# Patient Record
Sex: Male | Born: 1955
Health system: Southern US, Community
[De-identification: ages and names within clinical notes are randomized; demographics above are authoritative.]

## PROBLEM LIST (undated history)

## (undated) DIAGNOSIS — I7121 Aneurysm of the ascending aorta, without rupture: Secondary | ICD-10-CM

## (undated) DIAGNOSIS — E119 Type 2 diabetes mellitus without complications: Secondary | ICD-10-CM

## (undated) DIAGNOSIS — H409 Unspecified glaucoma: Secondary | ICD-10-CM

## (undated) DIAGNOSIS — G4730 Sleep apnea, unspecified: Secondary | ICD-10-CM

## (undated) DIAGNOSIS — R011 Cardiac murmur, unspecified: Secondary | ICD-10-CM

## (undated) DIAGNOSIS — Q2381 Bicuspid aortic valve: Secondary | ICD-10-CM

## (undated) DIAGNOSIS — R0902 Hypoxemia: Secondary | ICD-10-CM

## (undated) DIAGNOSIS — T7840XA Allergy, unspecified, initial encounter: Secondary | ICD-10-CM

## (undated) DIAGNOSIS — H269 Unspecified cataract: Secondary | ICD-10-CM

## (undated) DIAGNOSIS — Q231 Congenital insufficiency of aortic valve: Secondary | ICD-10-CM

## (undated) DIAGNOSIS — I2510 Atherosclerotic heart disease of native coronary artery without angina pectoris: Secondary | ICD-10-CM

## (undated) DIAGNOSIS — I712 Thoracic aortic aneurysm, without rupture: Secondary | ICD-10-CM

## (undated) DIAGNOSIS — I509 Heart failure, unspecified: Secondary | ICD-10-CM

## (undated) DIAGNOSIS — I499 Cardiac arrhythmia, unspecified: Secondary | ICD-10-CM

## (undated) HISTORY — DX: Atherosclerotic heart disease of native coronary artery without angina pectoris: I25.10

## (undated) HISTORY — DX: Unspecified glaucoma: H40.9

## (undated) HISTORY — PX: EYE SURGERY: SHX253

## (undated) HISTORY — DX: Bicuspid aortic valve: Q23.81

## (undated) HISTORY — DX: Congenital insufficiency of aortic valve: Q23.1

## (undated) HISTORY — PX: SPINE SURGERY: SHX786

## (undated) HISTORY — PX: CARDIAC VALVE REPLACEMENT: SHX585

## (undated) HISTORY — DX: Aneurysm of the ascending aorta, without rupture: I71.21

## (undated) HISTORY — PX: TONSILLECTOMY: SUR1361

## (undated) HISTORY — DX: Thoracic aortic aneurysm, without rupture: I71.2

## (undated) HISTORY — DX: Cardiac murmur, unspecified: R01.1

## (undated) HISTORY — DX: Unspecified cataract: H26.9

## (undated) HISTORY — DX: Allergy, unspecified, initial encounter: T78.40XA

## (undated) HISTORY — DX: Sleep apnea, unspecified: G47.30

## (undated) HISTORY — DX: Type 2 diabetes mellitus without complications: E11.9

## (undated) HISTORY — DX: Hypoxemia: R09.02

---

## 2004-01-03 HISTORY — PX: COLONOSCOPY: SHX174

## 2013-01-22 ENCOUNTER — Encounter: Payer: Self-pay | Admitting: Internal Medicine

## 2013-01-22 ENCOUNTER — Ambulatory Visit (INDEPENDENT_AMBULATORY_CARE_PROVIDER_SITE_OTHER): Payer: BC Managed Care – PPO | Admitting: Internal Medicine

## 2013-01-22 VITALS — BP 120/88 | HR 76 | Temp 97.9°F | Ht 77.5 in | Wt 218.0 lb

## 2013-01-22 DIAGNOSIS — R5383 Other fatigue: Principal | ICD-10-CM

## 2013-01-22 DIAGNOSIS — B370 Candidal stomatitis: Secondary | ICD-10-CM

## 2013-01-22 DIAGNOSIS — R059 Cough, unspecified: Secondary | ICD-10-CM | POA: Insufficient documentation

## 2013-01-22 DIAGNOSIS — Z125 Encounter for screening for malignant neoplasm of prostate: Secondary | ICD-10-CM

## 2013-01-22 DIAGNOSIS — R5381 Other malaise: Secondary | ICD-10-CM | POA: Insufficient documentation

## 2013-01-22 DIAGNOSIS — Z0000 Encounter for general adult medical examination without abnormal findings: Secondary | ICD-10-CM

## 2013-01-22 DIAGNOSIS — G609 Hereditary and idiopathic neuropathy, unspecified: Secondary | ICD-10-CM

## 2013-01-22 DIAGNOSIS — G629 Polyneuropathy, unspecified: Secondary | ICD-10-CM | POA: Insufficient documentation

## 2013-01-22 DIAGNOSIS — R05 Cough: Secondary | ICD-10-CM

## 2013-01-22 DIAGNOSIS — Z833 Family history of diabetes mellitus: Secondary | ICD-10-CM

## 2013-01-22 MED ORDER — NYSTATIN 100000 UNIT/ML MT SUSP: 5.0000 mL | Freq: Four times a day (QID) | OROMUCOSAL | Status: DC

## 2013-01-22 NOTE — Assessment & Plan Note (Signed)
Patient has strong family history of type 2 diabetes. Obtain screening A1c.

## 2013-01-22 NOTE — Assessment & Plan Note (Addendum)
Patient recently treated for upper respiratory infection in urgent care facility with course of azithromycin. I suspect oral thrush secondary to advised use. Treat with nystatin suspension.

## 2013-01-22 NOTE — Assessment & Plan Note (Addendum)
Patient complains of cough for 2-3 weeks. He was treated at urgent care with azithromycin.  Patient reports chest x-ray performed at urgent care was negative for acute cardiopulmonary disease.  Patient exam is unremarkable. He has persistent nonproductive cough. I suspect the initial etiology of upper respiratory illness was viral in nature. I recommended symptomatic treatment including use of Mucinex, gargling with warm salt water and using intranasal saline.  Patient advised to call office if symptoms persist or worsen.  Obtain CBCD.

## 2013-01-22 NOTE — Patient Instructions (Signed)
Please complete the following lab tests before your next follow up appointment: CPX labs within one week including PSA (V76.44) Gargle with warm salt water.  Use intranasal saline spray as directed

## 2013-01-22 NOTE — Assessment & Plan Note (Signed)
58 year old with clinical signs of mild peripheral neuropathy. He has hammertoes on exam. He discussed the possibility that he may have Charcot-Marie-Tooth disease.

## 2013-01-22 NOTE — Assessment & Plan Note (Signed)
Obtain screening blood tests.

## 2013-01-22 NOTE — Assessment & Plan Note (Signed)
We discussed pros and cons of Prostate cancer screening. Patient elects to obtain baseline PSA and digital rectal exam at next office visit.

## 2013-01-22 NOTE — Progress Notes (Signed)
Subjective:    Patient ID: Clifford Alvarez, male    DOB: 10/10/1955, 58 y.o.   MRN: [id number]  HPI  58 year-old white male to establish. Patient denies any chronic medical history.  It has been over 5 years since his last physical with Dr. Karren BurlyBlomgrin.  Patient reports childhood history of heart murmur. He was seen by cardiology in the past. Patient reports heart murmur involve the aortic valve. He was released by cardiology and his primary care physician could no longer appreciate murmur on previous physical exams. He denies any symptoms of aortic stenosis. He denies any history of syncope, chest pain or dizziness.  Patient was seen by urgent care facility approximately one week ago. He was seen for upper respiratory symptoms including cough, congestion and fever. He also felt unusual malaise. He was treated with course of azithromycin for 5 days. He had persistent cough and malaise and was reevaluated that same urgent care. Chest x-ray is reported clear by patient. His fevers have resolved. He has persistent nonproductive cough. No further antibiotics were prescribed. Patient was advised to followup with a primary care physician.  He is accompanied by supportive wife - Clifford Alvarez.  Preventative health care-patient reports completing colonoscopy age 58. He does not recall name of gastroenterologist who performed procedure. Patient reports colonoscopy was normal. Followup colonoscopy recommended in 10 years (at age 58)  Review of Systems  Constitutional: Negative for activity change, appetite change and unexpected weight change.  Eyes: Negative for visual disturbance.  Respiratory: positive for cough, negative for shortness of breath.   Negative for sinus pressure or sinus headache.  No dental pain He denies post nasal gtt Cardiovascular: Negative for chest pain.  Genitourinary: Negative for difficulty urinating.  Neurological: Negative for headaches.  Gastrointestinal: Negative for abdominal  pain, heartburn melena or hematochezia Psych: Negative for depression or anxiety Endo:  No polyuria or polydypsia       Past Medical History  Diagnosis Date  . Heart murmur     History of childhood heart murmur (Aortic)    History   Social History  . Marital Status: Married    Spouse Name: Clifford Alvarez    Number of Children: 2  . Years of Education: N/A   Occupational History  . 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 Smoking status: Never Smoker   . Smokeless tobacco: Not on file  . Alcohol Use: No  . Drug Use: No  . Sexual Activity: Not on file   Other Topics Concern  . Not on file   Social History Narrative   Married 38 years   Wife - Barbarann EhlersDebbie   Son [id number]   Daughter 7933    History reviewed. No pertinent past surgical history.  Family History  Problem Relation Age of Onset  . Diabetes Mother   . Diabetes Father   . Heart disease Father   . Diabetes Brother   . Diabetes Sister   . Hypertension Mother     No Known Allergies  No current outpatient prescriptions on file prior to visit.   No current facility-administered medications on file prior to visit.    BP 120/88  Pulse 76  Temp(Src) 97.9 F (36.6 C) (Oral)  Ht 6' 5.5" (1.969 m)  Wt 218 lb (98.884 kg)  BMI 25.51 kg/m2    Objective:   Physical Exam  Constitutional: He is oriented to person, place, and time. He appears well-developed and well-nourished.  No distress.  HENT:  Head: Normocephalic and atraumatic.  Right Ear: External ear normal.  Left Ear: External ear normal.  White film on tongue  Eyes: Conjunctivae and EOM are normal. Pupils are equal, round, and reactive to light.  Neck: Normal range of motion. Neck supple.  No carotid bruit  Cardiovascular: Normal rate, regular rhythm and normal heart sounds.   No murmur heard. No murmurs with left hand grip or Valsalva maneuver  Pulmonary/Chest: Effort normal and breath sounds normal. He has no wheezes.    Abdominal: Soft. Bowel sounds are normal. He exhibits no mass. There is no tenderness.  Musculoskeletal: He exhibits no edema.  Lymphadenopathy:    He has no cervical adenopathy.  Neurological: He is alert and oriented to person, place, and time. No cranial nerve deficit.  Bilateral hammar toes.  Slight decrease in sensation of temperature and vibration in bilateral lower extremities  Skin: Skin is warm and dry.  Psychiatric: He has a normal mood and affect. His behavior is normal.          Assessment & Plan:

## 2013-01-28 ENCOUNTER — Other Ambulatory Visit (INDEPENDENT_AMBULATORY_CARE_PROVIDER_SITE_OTHER): Payer: BC Managed Care – PPO

## 2013-01-28 DIAGNOSIS — Z125 Encounter for screening for malignant neoplasm of prostate: Secondary | ICD-10-CM

## 2013-01-28 DIAGNOSIS — Z833 Family history of diabetes mellitus: Secondary | ICD-10-CM

## 2013-01-28 DIAGNOSIS — B370 Candidal stomatitis: Secondary | ICD-10-CM

## 2013-01-28 DIAGNOSIS — Z0000 Encounter for general adult medical examination without abnormal findings: Secondary | ICD-10-CM

## 2013-01-28 LAB — CBC WITH DIFFERENTIAL/PLATELET
BASOS PCT: 0.6 % (ref 0.0–3.0)
Basophils Absolute: 0 10*3/uL (ref 0.0–0.1)
Eosinophils Absolute: 0.3 10*3/uL (ref 0.0–0.7)
Eosinophils Relative: 4.5 % (ref 0.0–5.0)
HCT: 44 % (ref 39.0–52.0)
Hemoglobin: 15.1 g/dL (ref 13.0–17.0)
LYMPHS PCT: 32.7 % (ref 12.0–46.0)
Lymphs Abs: 2 10*3/uL (ref 0.7–4.0)
MCHC: 34.2 g/dL (ref 30.0–36.0)
MCV: 88.3 fl (ref 78.0–100.0)
Monocytes Absolute: 0.7 10*3/uL (ref 0.1–1.0)
Monocytes Relative: 11.8 % (ref 3.0–12.0)
NEUTROS PCT: 50.4 % (ref 43.0–77.0)
Neutro Abs: 3 10*3/uL (ref 1.4–7.7)
PLATELETS: 240 10*3/uL (ref 150.0–400.0)
RBC: 4.98 Mil/uL (ref 4.22–5.81)
RDW: 12.4 % (ref 11.5–14.6)
WBC: 6 10*3/uL (ref 4.5–10.5)

## 2013-01-28 LAB — BASIC METABOLIC PANEL
BUN: 12 mg/dL (ref 6–23)
CO2: 28 mEq/L (ref 19–32)
Calcium: 9.2 mg/dL (ref 8.4–10.5)
Chloride: 100 mEq/L (ref 96–112)
Creatinine, Ser: 0.9 mg/dL (ref 0.4–1.5)
GFR: 89.56 mL/min (ref >60.00)
Glucose, Bld: 345 mg/dL — ABNORMAL HIGH (ref 70–99)
Potassium: 4.9 mEq/L (ref 3.5–5.1)
Sodium: 135 mEq/L (ref 135–145)

## 2013-01-28 LAB — LIPID PANEL
Cholesterol: 248 mg/dL — ABNORMAL HIGH (ref 0–200)
HDL: 36 mg/dL — ABNORMAL LOW (ref >39.00)
Total CHOL/HDL Ratio: 7
Triglycerides: 352 mg/dL — ABNORMAL HIGH (ref 0.0–149.0)
VLDL: 70.4 mg/dL — ABNORMAL HIGH (ref 0.0–40.0)

## 2013-01-28 LAB — HEPATIC FUNCTION PANEL
ALBUMIN: 3.8 g/dL (ref 3.5–5.2)
ALK PHOS: 100 U/L (ref 39–117)
ALT: 20 U/L (ref 0–53)
AST: 19 U/L (ref 0–37)
BILIRUBIN DIRECT: 0.1 mg/dL (ref 0.0–0.3)
Total Bilirubin: 1 mg/dL (ref 0.3–1.2)
Total Protein: 6.9 g/dL (ref 6.0–8.3)

## 2013-01-28 LAB — HEMOGLOBIN A1C: HEMOGLOBIN A1C: 12.1 % — AB (ref 4.6–6.5)

## 2013-01-28 LAB — PSA: PSA: 0.62 ng/mL (ref 0.10–4.00)

## 2013-01-28 LAB — LDL CHOLESTEROL, DIRECT: Direct LDL: 161 mg/dL

## 2013-01-28 LAB — TSH: TSH: 1.86 u[IU]/mL (ref 0.35–5.50)

## 2013-01-29 ENCOUNTER — Telehealth: Payer: Self-pay | Admitting: Internal Medicine

## 2013-01-29 ENCOUNTER — Ambulatory Visit (INDEPENDENT_AMBULATORY_CARE_PROVIDER_SITE_OTHER): Payer: BC Managed Care – PPO | Admitting: Internal Medicine

## 2013-01-29 ENCOUNTER — Encounter: Payer: Self-pay | Admitting: Internal Medicine

## 2013-01-29 VITALS — BP 146/92 | Ht 77.5 in | Wt 218.0 lb

## 2013-01-29 DIAGNOSIS — E1142 Type 2 diabetes mellitus with diabetic polyneuropathy: Secondary | ICD-10-CM | POA: Insufficient documentation

## 2013-01-29 DIAGNOSIS — IMO0001 Reserved for inherently not codable concepts without codable children: Secondary | ICD-10-CM

## 2013-01-29 DIAGNOSIS — E1165 Type 2 diabetes mellitus with hyperglycemia: Principal | ICD-10-CM

## 2013-01-29 DIAGNOSIS — E1149 Type 2 diabetes mellitus with other diabetic neurological complication: Secondary | ICD-10-CM

## 2013-01-29 DIAGNOSIS — I10 Essential (primary) hypertension: Secondary | ICD-10-CM

## 2013-01-29 DIAGNOSIS — IMO0002 Reserved for concepts with insufficient information to code with codable children: Secondary | ICD-10-CM

## 2013-01-29 DIAGNOSIS — E785 Hyperlipidemia, unspecified: Secondary | ICD-10-CM

## 2013-01-29 LAB — HIV ANTIBODY (ROUTINE TESTING W REFLEX): HIV: NONREACTIVE

## 2013-01-29 MED ORDER — METFORMIN HCL 500 MG PO TABS: 500.0000 mg | ORAL_TABLET | Freq: Two times a day (BID) | ORAL | Status: DC

## 2013-01-29 MED ORDER — INSULIN PEN NEEDLE 32G X 4 MM MISC: 1.0000 | Freq: Every day | Status: DC

## 2013-01-29 MED ORDER — GLUCOSE BLOOD VI STRP: 1.0000 | ORAL_STRIP | Freq: Three times a day (TID) | Status: DC

## 2013-01-29 MED ORDER — INSULIN GLARGINE 100 UNIT/ML SOLOSTAR PEN: 10.0000 [IU] | PEN_INJECTOR | Freq: Every day | SUBCUTANEOUS | Status: DC

## 2013-01-29 MED ORDER — LOSARTAN POTASSIUM 25 MG PO TABS: 25.0000 mg | ORAL_TABLET | Freq: Every day | ORAL | Status: DC

## 2013-01-29 MED ORDER — ATORVASTATIN CALCIUM 20 MG PO TABS: 20.0000 mg | ORAL_TABLET | Freq: Every day | ORAL | Status: DC

## 2013-01-29 NOTE — Assessment & Plan Note (Signed)
Start losartan for renal protection. Check microalbumin to creatinine ratio.

## 2013-01-29 NOTE — Progress Notes (Signed)
   Subjective:    Patient ID: Clifford Alvarez, male    DOB: 12/25/1955, 58 y.o.   MRN: [id number]  HPI  58 year old white male previously seen for viral upper ear infection for followup. Patient reports feeling much better. He still has mild residual cough. We obtain screening blood work for type 2 diabetes considering strong family history. Patient denies any symptoms of polyuria or polydipsia.  Patient's blood work showed significant hyperglycemia with fasting blood sugar 345 and hemoglobin A1c of 12.1.  His oral thrush resolved with nystatin suspension.   Review of Systems    negative for chest pain, mild fatigue  Past Medical History  Diagnosis Date  . Heart murmur     History of childhood heart murmur (Aortic)  . Glaucoma     History   Social History  . Marital Status: Married    Spouse Name: Debbie    Number of Children: 2  . Years of Education: N/A   Occupational History  . 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 Smoking status: Never Smoker   . Smokeless tobacco: Not on file  . Alcohol Use: No  . Drug Use: No  . Sexual Activity: Not on file   Other Topics Concern  . Not on file   Social History Narrative   Married 38 years   Wife - Eunice BlaseDebbie   Son [id number]   Daughter [id number]    No past surgical history on file.  Family History  Problem Relation Age of Onset  . Diabetes Mother   . Diabetes Father   . Heart disease Father   . Diabetes Brother   . Diabetes Sister   . Hypertension Mother     No Known Allergies  Current Outpatient Prescriptions on File Prior to Visit  Medication Sig Dispense Refill  . nystatin (MYCOSTATIN) 100000 UNIT/ML suspension Take 5 mLs (500,000 Units total) by mouth 4 (four) times daily.  60 mL  0   No current facility-administered medications on file prior to visit.    BP 146/92  Ht 6' 5.5" (1.969 m)  Wt 218 lb (98.884 kg)  BMI 25.51 kg/m2      Objective:   Physical Exam    Constitutional: He is oriented to person, place, and time. He appears well-developed and well-nourished. No distress.  HENT:  Head: Normocephalic and atraumatic.  Neck: Neck supple.  No carotid bruit  Cardiovascular: Normal rate, regular rhythm and normal heart sounds.   No murmur heard. Pulmonary/Chest: Effort normal and breath sounds normal. He has no wheezes.  Neurological: He is alert and oriented to person, place, and time. No cranial nerve deficit.  Decreased sensation to temperature and vibration in lower extremities  Skin:  Dry, scaly feet  Psychiatric: He has a normal mood and affect. His behavior is normal.          Assessment & Plan:

## 2013-01-29 NOTE — Telephone Encounter (Signed)
I received a PA for One Touch Verio Test Strip and per pt's plan Bayer Contour Test Strips and Bayer Next Test Strips are covered without a PA needed.  Would you like for me to attempt PA approval on the One Touch or would you like to change the RX to one of the Bayer Test Strips??  Please advise

## 2013-01-29 NOTE — Assessment & Plan Note (Addendum)
58 year old white male with new onset type 2 diabetes. His A1c is 12.1. He likely has had hyperglycemia for many years. I suspect his peripheral neuropathy secondary to uncontrolled diabetes. Start insulin therapy Lantus 10 units at bedtime. Patient understands to titrate his Lantus dose every other day by 2 units until he is seen by endocrinologist. Patient advised to limit his Lantus dose to 20 units at bedtime. Also start Metformin 500 mg twice daily.  We discussed at length need to follow carb modified diet. Patient currently drinking quite a bit of fruit juices.  Glucometer provided.  Patient advised to follow up with his Ophthalmologist.  Patient given copy of his blood work.

## 2013-01-29 NOTE — Patient Instructions (Signed)
Please complete the following lab tests before your next follow up appointment: BMET - 401.9, 250.02 FLP, LFTs - 272.4

## 2013-01-29 NOTE — Assessment & Plan Note (Signed)
We discussed at length importance of risk factor reduction for macrovascular complications of type 2 diabetes. Start atorvastatin 20 mg once daily.

## 2013-01-30 MED ORDER — INSULIN DETEMIR 100 UNIT/ML FLEXPEN: 10.0000 [IU] | PEN_INJECTOR | Freq: Every day | SUBCUTANEOUS | Status: DC

## 2013-01-30 MED ORDER — GLUCOSE BLOOD VI STRP: 1.0000 | ORAL_STRIP | Freq: Three times a day (TID) | Status: DC

## 2013-01-30 NOTE — Telephone Encounter (Signed)
Levemir is $398

## 2013-01-30 NOTE — Telephone Encounter (Signed)
Per Dr Artist PaisYoo give pt samples of levemir, samples in refrigerator on IM side with pts name on it.  Left message on pts cell phone

## 2013-01-30 NOTE — Telephone Encounter (Signed)
rx changed to contour, pt is going to come by and pick up the contour glucometer.  He also informed me that his insurance doesn't cover lantus.  The recommend novolog/novolin Please advise

## 2013-01-30 NOTE — Telephone Encounter (Signed)
See if they cover Levemir.  If yes, same dosage and instructions.

## 2013-01-31 ENCOUNTER — Telehealth: Payer: Self-pay

## 2013-01-31 ENCOUNTER — Telehealth: Payer: Self-pay | Admitting: Internal Medicine

## 2013-01-31 NOTE — Telephone Encounter (Signed)
Relevant patient education assigned to patient using Emmi. ° °

## 2013-01-31 NOTE — Telephone Encounter (Signed)
2 boxes of levemir given

## 2013-01-31 NOTE — Telephone Encounter (Signed)
Give him enough samples Levemir until he sees endocrinologist.

## 2013-02-06 ENCOUNTER — Ambulatory Visit (INDEPENDENT_AMBULATORY_CARE_PROVIDER_SITE_OTHER): Payer: BC Managed Care – PPO | Admitting: Internal Medicine

## 2013-02-06 ENCOUNTER — Encounter: Payer: Self-pay | Admitting: Internal Medicine

## 2013-02-06 VITALS — BP 112/70 | HR 85 | Temp 97.9°F | Resp 12 | Ht 76.5 in | Wt 221.0 lb

## 2013-02-06 DIAGNOSIS — E1165 Type 2 diabetes mellitus with hyperglycemia: Principal | ICD-10-CM

## 2013-02-06 DIAGNOSIS — E1149 Type 2 diabetes mellitus with other diabetic neurological complication: Secondary | ICD-10-CM

## 2013-02-06 DIAGNOSIS — IMO0002 Reserved for concepts with insufficient information to code with codable children: Secondary | ICD-10-CM

## 2013-02-06 MED ORDER — INSULIN DETEMIR 100 UNIT/ML FLEXPEN: 20.0000 [IU] | PEN_INJECTOR | Freq: Every day | SUBCUTANEOUS | Status: DC

## 2013-02-06 MED ORDER — METFORMIN HCL 500 MG PO TABS: 1000.0000 mg | ORAL_TABLET | Freq: Two times a day (BID) | ORAL | Status: DC

## 2013-02-06 NOTE — Progress Notes (Signed)
Patient ID: Clifford Alvarez, male   DOB: April 05, 1955, 58 y.o.   MRN: [id number]  HPI: Clifford Alvarez is a 58 y.o.-year-old male, referred by his PCP, Dr. Artist Pais, for management of DM2, insulin-dependent, uncontrolled, with complications (Peripheral neuropathy)  Patient has been diagnosed with diabetes on 01/28/2013. Last hemoglobin A1c was: Lab Results  Component Value Date   HGBA1C 12.1* 01/28/2013   Pt is on a regimen of: - Metformin 500 mg po bid - started 1 week ago - Levemir 18 units qhs - started 1 week ago, increased it last night from 16 units. He injects insulin correctly, in the abdomen, keeps needle in for 10 sec after last unit of insulin in; changes needle with every use; keeps insulin in use out of the fridge. No pbs with inj sites.   Pt checks his sugars 3x a day and they are: - am: 202-252 - 2h after b'fast: n/c - before lunch: n/c - <1h after lunch: 175-225  - before dinner: n/c - 2h after dinner: n/c - bedtime: 207-231 No lows. Lowest sugar was 175; ? has hypoglycemia awareness Highest sugar was 350.  Pt's meals are: - Breakfast: bagel, oatmeal, granola, eggs - Lunch: meat + 2-3 vegetables; soup; salad; sandwich - Dinner: meat + 2-3 vegetables; pasta; [place]; seafood - Snacks: 1 a day >> nuts, popcorn, dark chocolate No regular exercise.  - no CKD, last BUN/creatinine:  Lab Results  Component Value Date   BUN 12 01/28/2013   CREATININE 0.9 01/28/2013  He is on Losartan. - He has HL. last set of lipids: Lab Results  Component Value Date   CHOL 248* 01/28/2013   HDL 36.00* 01/28/2013   LDLDIRECT 161.0 01/28/2013   TRIG 352.0* 01/28/2013   CHOLHDL 7 01/28/2013  He is on Lipitor. - last eye exam was in 11/2012. No DR. Has glaucoma. + blurry vision.  - no numbness and tingling in his feet. He had a foot exam 01/28/2013: neuropathy L  Pt has FH of DM in parents, brother, sister.  ROS: Constitutional: + weight loss, no fatigue, no subjective  hyperthermia/hypothermia Eyes: no blurry vision, no xerophthalmia ENT: no sore throat, no nodules palpated in throat, no dysphagia/odynophagia, no hoarseness; + hypoacusis; + tinnitus Cardiovascular: no CP/SOB/palpitations/leg swelling Respiratory: + cough/no SOB/+wheezing Gastrointestinal: no N/V/D/C Musculoskeletal: no muscle/joint aches Skin: no rashes Neurological: no tremors/numbness/tingling/dizziness Psychiatric: no depression/anxiety  Past Medical History  Diagnosis Date  . Heart murmur     History of childhood heart murmur (Aortic)  . Glaucoma    History reviewed. No pertinent past surgical history. History   Social History  . Marital Status: Married    Spouse Name: Debbie    Number of Children: 2   Occupational History  . Self employed - Environmental consultant   Social History Main Topics  . Smoking status: Never Smoker   . Smokeless tobacco: Not on file  . Alcohol Use: No  . Drug Use: No   Social History Narrative   Married 38 years   Wife - Clifford Alvarez   Son 28   Daughter 62   Current Outpatient Prescriptions on File Prior to Visit  Medication Sig Dispense Refill  . atorvastatin (LIPITOR) 20 MG tablet Take 1 tablet (20 mg total) by mouth daily.  90 tablet  0  . glucose blood (BAYER CONTOUR NEXT TEST) test strip 1 each by Other route 3 (three) times daily. Use as instructed  100 each  5  . Insulin  Detemir (LEVEMIR FLEXTOUCH) 100 UNIT/ML Pen Inject 10 Units into the skin daily at 10 pm.  15 mL  2  . Insulin Pen Needle (BD PEN NEEDLE NANO U/F) 32G X 4 MM MISC 1 each by Does not apply route at bedtime.  50 each  5  . losartan (COZAAR) 25 MG tablet Take 1 tablet (25 mg total) by mouth daily.  90 tablet  0  . metFORMIN (GLUCOPHAGE) 500 MG tablet Take 1 tablet (500 mg total) by mouth 2 (two) times daily with a meal.  180 tablet  1  . nystatin (MYCOSTATIN) 100000 UNIT/ML suspension Take 5 mLs (500,000 Units total) by mouth 4 (four) times daily.  60 mL  0   No  current facility-administered medications on file prior to visit.   No Known Allergies Family History  Problem Relation Age of Onset  . Diabetes Mother   . Diabetes Father   . Heart disease Father   . Diabetes Brother   . Diabetes Sister   . Hypertension Mother    PE: BP 112/70  Pulse 85  Temp(Src) 97.9 F (36.6 C) (Oral)  Resp 12  Ht 6' 4.5" (1.943 m)  Wt 221 lb (100.245 kg)  BMI 26.55 kg/m2  SpO2 95% Wt Readings from Last 3 Encounters:  02/06/13 221 lb (100.245 kg)  01/29/13 218 lb (98.884 kg)  01/22/13 218 lb (98.884 kg)   Constitutional: slightly overweight, in NAD Eyes: PERRLA, EOMI, no exophthalmos ENT: moist mucous membranes, no thyromegaly, no cervical lymphadenopathy Cardiovascular: RRR, No MRG Respiratory: CTA B Gastrointestinal: abdomen soft, NT, ND, BS+ Musculoskeletal: no deformities, strength intact in all 4 Skin: moist, warm, no rashes Neurological: no tremor with outstretched hands, DTR normal in all 4  ASSESSMENT: 1. DM2, insulin-dependent, uncontrolled, with complications  - PN  PLAN:  1. Patient with new dx of diabetes, on basal insulin and half-maximal metformin dose, recently started. - We discussed about options for treatment, and I suggested to:  Patient Instructions  Please increase Metformin to 1000 mg 2x a day. Please increase Levemir to 20 units daily at bedtime. Please check sugars 2-3x a day, rotating check times. Please return in 1 month with your sugar log.  - discussed healthy diet with specific examples; underlined the importance of nutrition in his DM control - also discussed that he may be able to come off insulin in the future - continue checking sugars at different times of the day - check 2-3 times a day, rotating checks - given sugar log and advised how to fill it and to bring it at next appt  - given foot care handout and explained the principles  - given instructions for hypoglycemia management "15-15 rule"  - advised for  yearly eye exams - has appt with DM education in 1 mo - Return to clinic in 1 mo with sugar log

## 2013-02-06 NOTE — Patient Instructions (Signed)
Please increase Metformin to 1000 mg 2x a day. Please increase Levemir to 20 units daily at bedtime. Please check sugars 2-3x a day, rotating check times. Please return in 1 month with your sugar log.   PATIENT INSTRUCTIONS FOR TYPE 2 DIABETES:  **Please join MyChart!** - see attached instructions about how to join   DIET AND EXERCISE Diet and exercise is an important part of diabetic treatment.  We recommended aerobic exercise in the form of brisk walking (working between 40-60% of maximal aerobic capacity, similar to brisk walking) for 150 minutes per week (such as 30 minutes five days per week) along with 3 times per week performing 'resistance' training (using various gauge rubber tubes with handles) 5-10 exercises involving the major muscle groups (upper body, lower body and core) performing 10-15 repetitions (or near fatigue) each exercise. Start at half the above goal but build slowly to reach the above goals. If limited by weight, joint pain, or disability, we recommend daily walking in a swimming pool with water up to waist to reduce pressure from joints while allow for adequate exercise.    BLOOD GLUCOSES Monitoring your blood glucoses is important for continued management of your diabetes. Please check your blood glucoses 2-4 times a day: fasting, before meals and at bedtime (you can rotate these measurements - e.g. one day check before the 3 meals, the next day check before 2 of the meals and before bedtime, etc.   HYPOGLYCEMIA (low blood sugar) Hypoglycemia is usually a reaction to not eating, exercising, or taking too much insulin/ other diabetes drugs.  Symptoms include tremors, sweating, hunger, confusion, headache, etc. Treat IMMEDIATELY with 15 grams of Carbs:   4 glucose tablets    cup regular juice/soda   2 tablespoons raisins   4 teaspoons sugar   1 tablespoon honey Recheck blood glucose in 15 mins and repeat above if still symptomatic/blood glucose <100. Please  contact our office at [phone] if you have questions about how to next handle your insulin.  RECOMMENDATIONS TO REDUCE YOUR RISK OF DIABETIC COMPLICATIONS: * Take your prescribed MEDICATION(S). * Follow a DIABETIC diet: Complex carbs, fiber rich foods, heart healthy fish twice weekly, (monounsaturated and polyunsaturated) fats * AVOID saturated/trans fats, high fat foods, >2,300 mg salt per day. * EXERCISE at least 5 times a week for 30 minutes or preferably daily.  * DO NOT SMOKE OR DRINK more than 1 drink a day. * Check your FEET every day. Do not wear tightfitting shoes. Contact us if you develop an ulcer * See your EYE doctor once a year or more if needed * Get a FLU shot once a year * Get a PNEUMONIA vaccine once before and once after age 59 years  GOALS:  * Your Hemoglobin A1c of <7%  * fasting sugars need to be <130 * after meals sugars need to be <180 (2h after you start eating) * Your Systolic BP should be 140 or lower  * Your Diastolic BP should be 80 or lower  * Your HDL (Good Cholesterol) should be 40 or higher  * Your LDL (Bad Cholesterol) should be 100 or lower  * Your Triglycerides should be 150 or lower  * Your Urine microalbumin (kidney function) should be <30 * Your Body Mass Index should be 25 or lower   We will be glad to help you achieve these goals. Our telephone number is: [phone].  Please consider the following ways to cut down carbs and fat and increase fiber and  micronutrients in your diet:  - substitute whole grain for white bread or pasta - substitute brown rice for white rice - substitute 90-calorie flat bread pieces for slices of bread when possible - substitute sweet potatoes or yams for white potatoes - substitute humus for margarine - substitute tofu for cheese when possible - substitute almond or rice milk for regular milk (would not drink soy milk daily due to concern for soy estrogen influence on breast cancer risk) - substitute dark  chocolate for other sweets when possible - substitute water - can add lemon or orange slices for taste - for diet sodas (artificial sweeteners will trick your body that you can eat sweets without getting calories and will lead you to overeating and weight gain in the long run) - do not skip breakfast or other meals (this will slow down the metabolism and will result in more weight gain over time)  - can try smoothies made from fruit and almond/rice milk in am instead of regular breakfast - can also try old-fashioned (not instant) oatmeal made with almond/rice milk in am - order the dressing on the side when eating salad at a restaurant (pour less than half of the dressing on the salad) - eat as little meat as possible - can try juicing, but should not forget that juicing will get rid of the fiber, so would alternate with eating raw veg./fruits or drinking smoothies - use as little oil as possible, even when using olive oil - can dress a salad with a mix of balsamic vinegar and lemon juice, for e.g. - use agave nectar, stevia sugar, or regular sugar rather than artificial sweateners - steam or broil/roast veggies  - snack on veggies/fruit/nuts (unsalted, preferably) when possible, rather than processed foods - reduce or eliminate aspartame in diet (it is in diet sodas, chewing gum, etc) Read the labels!  Try to read Dr. Katherina Right book: "Program for Reversing Diabetes" for the vegan concept and other ideas for healthy eating.  Try to replace snacking on these with drinking/eating: * soy or almond milk * veggies with humus or other low calorie/low fat dip * low glycemic index fruits (higher glycemic index = higher risk to increase your sugars):          http://www.health.https://www.brown.info/ * fruit/veggie smoothies          Ninja blender recipes:          [employer].ee * unsalted  nuts Etc.

## 2013-02-17 ENCOUNTER — Ambulatory Visit: Payer: BC Managed Care – PPO | Admitting: Internal Medicine

## 2013-02-27 ENCOUNTER — Encounter: Payer: Self-pay | Admitting: Internal Medicine

## 2013-02-28 ENCOUNTER — Other Ambulatory Visit: Payer: Self-pay | Admitting: *Deleted

## 2013-02-28 MED ORDER — INSULIN DETEMIR 100 UNIT/ML FLEXPEN: 20.0000 [IU] | PEN_INJECTOR | Freq: Every day | SUBCUTANEOUS | Status: DC

## 2013-03-12 ENCOUNTER — Other Ambulatory Visit: Payer: BC Managed Care – PPO

## 2013-03-12 ENCOUNTER — Ambulatory Visit: Payer: BC Managed Care – PPO | Admitting: Internal Medicine

## 2013-03-12 DIAGNOSIS — E1165 Type 2 diabetes mellitus with hyperglycemia: Principal | ICD-10-CM

## 2013-03-12 DIAGNOSIS — E1149 Type 2 diabetes mellitus with other diabetic neurological complication: Secondary | ICD-10-CM

## 2013-03-12 DIAGNOSIS — IMO0002 Reserved for concepts with insufficient information to code with codable children: Secondary | ICD-10-CM

## 2013-03-12 LAB — MICROALBUMIN / CREATININE URINE RATIO
CREATININE, U: 186.5 mg/dL
MICROALB/CREAT RATIO: 0.3 mg/g (ref 0.0–30.0)
Microalb, Ur: 0.5 mg/dL (ref 0.0–1.9)

## 2013-03-13 ENCOUNTER — Encounter: Payer: Self-pay | Admitting: Internal Medicine

## 2013-03-13 ENCOUNTER — Ambulatory Visit (INDEPENDENT_AMBULATORY_CARE_PROVIDER_SITE_OTHER): Payer: BC Managed Care – PPO | Admitting: Internal Medicine

## 2013-03-13 VITALS — BP 118/72 | HR 86 | Temp 98.3°F | Resp 12 | Wt 223.0 lb

## 2013-03-13 DIAGNOSIS — IMO0002 Reserved for concepts with insufficient information to code with codable children: Secondary | ICD-10-CM

## 2013-03-13 DIAGNOSIS — E1165 Type 2 diabetes mellitus with hyperglycemia: Principal | ICD-10-CM

## 2013-03-13 DIAGNOSIS — E1149 Type 2 diabetes mellitus with other diabetic neurological complication: Secondary | ICD-10-CM

## 2013-03-13 MED ORDER — CANAGLIFLOZIN 100 MG PO TABS: ORAL_TABLET | ORAL | Status: DC

## 2013-03-13 NOTE — Progress Notes (Signed)
Patient ID: Clifford Alvarez, male   DOB: 03/06/1955, 58 y.o.   MRN: [id number]  HPI: Clifford EdmanRobert Steven Alvarez is a 58 y.o.-year-old male, returning for f/u for DM2 dx 01/28/2013, insulin-dependent since dx, uncontrolled, with complications (Peripheral neuropathy). Last visit was 1 mo ago.  Last hemoglobin A1c was: Lab Results  Component Value Date   HGBA1C 12.1* 01/28/2013   Pt is on a regimen of: - Metformin 500 >> 1000 mg bid - Levemir 18 >> 20 units qhs He injects insulin correctly, in the abdomen, keeps needle in for 10 sec after last unit of insulin in; changes needle with every use; keeps insulin in use out of the fridge. No pbs with inj sites.   Pt checks his sugars 3x a day and they are improved: - am: 202-252 >> 138-177 - 2h after b'fast: n/c >> 146 - before lunch: n/c >> 135-158 (206) - <1h after lunch: 175-225 >> 2h after lunch: 117-136 - before dinner: n/c - 2h after dinner: n/c - bedtime: 207-231 >> 133-200 No lows. Lowest sugar was 117 ? has hypoglycemia awareness Highest sugar was 206.  Pt's meals are: - Breakfast: bagel, oatmeal, granola, eggs - Lunch: meat + 2-3 vegetables; soup; salad; sandwich - Dinner: meat + 2-3 vegetables; pasta; [place]; seafood - Snacks: 1 a day >> nuts, popcorn, dark chocolate No regular exercise.  - no CKD, last BUN/creatinine:  Lab Results  Component Value Date   BUN 12 01/28/2013   CREATININE 0.9 01/28/2013  He is on Losartan. - He has HL. last set of lipids: Lab Results  Component Value Date   CHOL 248* 01/28/2013   HDL 36.00* 01/28/2013   LDLDIRECT 161.0 01/28/2013   TRIG 352.0* 01/28/2013   CHOLHDL 7 01/28/2013  He is on Lipitor. - last eye exam was in 11/2012. No DR. Has glaucoma. + blurry vision.  - no numbness and tingling in his feet. Foot exam 01/28/2013: neuropathy L  I reviewed pt's medications, allergies, PMH, social hx, family hx and no changes required, except as mentioned above.  ROS: Constitutional: no weight  loss, no fatigue, no subjective hyperthermia/hypothermia, + poor sleep Eyes: no blurry vision, no xerophthalmia ENT: no sore throat, no nodules palpated in throat, no dysphagia/odynophagia, no hoarseness; + hypoacusis; + tinnitus Cardiovascular: no CP/SOB/palpitations/leg swelling Respiratory: no cough/no SOB/wheezing Gastrointestinal: no N/V/D/C Musculoskeletal: no muscle/joint aches Skin: no rashes Neurological: no tremors/numbness/tingling/dizziness, + HA  PE: BP 118/72  Pulse 86  Temp(Src) 98.3 F (36.8 C) (Oral)  Resp 12  Wt 223 lb (101.152 kg)  SpO2 98% Wt Readings from Last 3 Encounters:  03/13/13 223 lb (101.152 kg)  02/06/13 221 lb (100.245 kg)  01/29/13 218 lb (98.884 kg)   Constitutional: slightly overweight, in NAD Eyes: PERRLA, EOMI, no exophthalmos ENT: moist mucous membranes, no thyromegaly, no cervical lymphadenopathy Cardiovascular: RRR, No MRG Respiratory: CTA B Gastrointestinal: abdomen soft, NT, ND, BS+ Musculoskeletal: no deformities, strength intact in all 4 Skin: moist, warm, no rashes  ASSESSMENT: 1. DM2, insulin-dependent, uncontrolled, with complications  - PN  PLAN:  1. Patient with recent dx of diabetes, on basal insulin and now maximal metformin dose, with improved control. - I suggested to: Patient Instructions  Please continue Levemir 20 units at night. Please continue Metformin 1000 mg 2x a day. Add Invokana 100 mg daily in am. Please return in 1 month with your sugar log.  - we discussed about SEs of Invokana, which are: dizziness (advised to be careful when stands from sitting position),  decreased BP - usually not < normal (BP today is not low), and fungal UTIs (advised to let me know if develops one).  - given discount card for Invokana  - he has a high copay and we thought about using Januvia but his copay might be high, while Invokana is free - at last visit we discussed healthy diet with specific examples; underlined the importance  of nutrition in his DM control.  - he may be able to come off insulin in the future - continue checking sugars at different times of the day - check 2-3 times a day, rotating checks - he is up to date with yearly eye exams - will have DM education appt in 2 weeks - Return to clinic in 1 mo with sugar log

## 2013-03-13 NOTE — Patient Instructions (Signed)
Please continue Levemir 20 units at night. Please continue Metformin 1000 mg 2x a day. Add Invokana 100 mg daily in am.  Please return in 1 month with your sugar log.

## 2013-03-18 ENCOUNTER — Encounter: Payer: Self-pay | Admitting: Internal Medicine

## 2013-03-18 ENCOUNTER — Other Ambulatory Visit: Payer: Self-pay | Admitting: *Deleted

## 2013-03-18 MED ORDER — METFORMIN HCL 500 MG PO TABS: 1000.0000 mg | ORAL_TABLET | Freq: Two times a day (BID) | ORAL | Status: DC

## 2013-03-19 ENCOUNTER — Ambulatory Visit (INDEPENDENT_AMBULATORY_CARE_PROVIDER_SITE_OTHER): Payer: BC Managed Care – PPO | Admitting: Internal Medicine

## 2013-03-19 ENCOUNTER — Encounter: Payer: Self-pay | Admitting: Internal Medicine

## 2013-03-19 VITALS — BP 118/90 | HR 72 | Temp 97.9°F | Ht 77.5 in | Wt 220.0 lb

## 2013-03-19 DIAGNOSIS — I10 Essential (primary) hypertension: Secondary | ICD-10-CM

## 2013-03-19 DIAGNOSIS — E1149 Type 2 diabetes mellitus with other diabetic neurological complication: Secondary | ICD-10-CM

## 2013-03-19 DIAGNOSIS — E1165 Type 2 diabetes mellitus with hyperglycemia: Secondary | ICD-10-CM

## 2013-03-19 DIAGNOSIS — E785 Hyperlipidemia, unspecified: Secondary | ICD-10-CM

## 2013-03-19 DIAGNOSIS — IMO0002 Reserved for concepts with insufficient information to code with codable children: Secondary | ICD-10-CM

## 2013-03-19 LAB — LIPID PANEL
CHOL/HDL RATIO: 4
CHOLESTEROL: 128 mg/dL (ref 0–200)
HDL: 33 mg/dL — ABNORMAL LOW (ref >39.00)
LDL CALC: 50 mg/dL (ref 0–99)
TRIGLYCERIDES: 226 mg/dL — AB (ref 0.0–149.0)
VLDL: 45.2 mg/dL — ABNORMAL HIGH (ref 0.0–40.0)

## 2013-03-19 LAB — BASIC METABOLIC PANEL
BUN: 23 mg/dL (ref 6–23)
CHLORIDE: 103 meq/L (ref 96–112)
CO2: 28 meq/L (ref 19–32)
Calcium: 9.2 mg/dL (ref 8.4–10.5)
Creatinine, Ser: 1.1 mg/dL (ref 0.4–1.5)
GFR: 72.08 mL/min (ref >60.00)
Glucose, Bld: 126 mg/dL — ABNORMAL HIGH (ref 70–99)
POTASSIUM: 4 meq/L (ref 3.5–5.1)
Sodium: 138 mEq/L (ref 135–145)

## 2013-03-19 LAB — HEPATIC FUNCTION PANEL
ALBUMIN: 4.6 g/dL (ref 3.5–5.2)
ALT: 18 U/L (ref 0–53)
AST: 20 U/L (ref 0–37)
Alkaline Phosphatase: 78 U/L (ref 39–117)
Bilirubin, Direct: 0 mg/dL (ref 0.0–0.3)
TOTAL PROTEIN: 7.7 g/dL (ref 6.0–8.3)
Total Bilirubin: 0.8 mg/dL (ref 0.3–1.2)

## 2013-03-19 MED ORDER — LOSARTAN POTASSIUM 25 MG PO TABS: 25.0000 mg | ORAL_TABLET | Freq: Every day | ORAL | Status: DC

## 2013-03-19 MED ORDER — ATORVASTATIN CALCIUM 20 MG PO TABS: 20.0000 mg | ORAL_TABLET | Freq: Every day | ORAL | Status: DC

## 2013-03-19 NOTE — Assessment & Plan Note (Signed)
Continue losartan 25 mg for renal protection.  Monitor electrolytes and kidney function.

## 2013-03-19 NOTE — Progress Notes (Signed)
Subjective:    Patient ID: Clifford Alvarez, male    DOB: 10/09/1955, 58 y.o.   MRN: [id number]  HPI 58 year old white male with recently diagnosed new onset type 2 diabetes for followup. Patient referred to endocrinologist. Patient not taking Levemir, metformin, and Invokana.  Patient's blood sugars much better controlled. His fasting blood sugars are between 110 and 150. He has yet to start regular exercise program.  Patient tolerating low-dose losartan 25 mg. Patient also having no difficulty with atorvastatin 20 mg once daily. Recent diabetic eye exam reported normal.  Review of Systems Negative for chest pain Past Medical History  Diagnosis Date  . Heart murmur     History of childhood heart murmur (Aortic)  . Glaucoma     History   Social History  . Marital Status: Married    Spouse Name: Debbie    Number of Children: 2  . Years of Education: N/A   Occupational History  . 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 Smoking status: Never Smoker   . Smokeless tobacco: Not on file  . Alcohol Use: No  . Drug Use: No  . Sexual Activity: Not on file   Other Topics Concern  . Not on file   Social History Narrative   Married 38 years   Wife - Eunice BlaseDebbie   Son [id number]   Daughter [id number]    No past surgical history on file.  Family History  Problem Relation Age of Onset  . Diabetes Mother   . Diabetes Father   . Heart disease Father   . Diabetes Brother   . Diabetes Sister   . Hypertension Mother     Allergies  Allergen Reactions  . Penicillins     As a child    Current Outpatient Prescriptions on File Prior to Visit  Medication Sig Dispense Refill  . atorvastatin (LIPITOR) 20 MG tablet Take 1 tablet (20 mg total) by mouth daily.  90 tablet  0  . Canagliflozin (INVOKANA) 100 MG TABS Take 1 tablet by mouth daily inam  30 tablet  3  . glucose blood (BAYER CONTOUR NEXT TEST) test strip 1 each by Other route 3 (three) times daily.  Use as instructed  100 each  5  . Insulin Detemir (LEVEMIR FLEXTOUCH) 100 UNIT/ML Pen Inject 20 Units into the skin daily at 10 pm.  15 mL  2  . Insulin Pen Needle (BD PEN NEEDLE NANO U/F) 32G X 4 MM MISC 1 each by Does not apply route at bedtime.  50 each  5  . latanoprost (XALATAN) 0.005 % ophthalmic solution Place 1 drop into both eyes at bedtime.       Marland Kitchen. losartan (COZAAR) 25 MG tablet Take 1 tablet (25 mg total) by mouth daily.  90 tablet  0  . metFORMIN (GLUCOPHAGE) 500 MG tablet Take 2 tablets (1,000 mg total) by mouth 2 (two) times daily with a meal.  180 tablet  1   No current facility-administered medications on file prior to visit.    BP 118/90  Pulse 72  Temp(Src) 97.9 F (36.6 C) (Oral)  Ht 6' 5.5" (1.969 m)  Wt 220 lb (99.791 kg)  BMI 25.74 kg/m2       Objective:   Physical Exam  Constitutional: He is oriented to person, place, and time. He appears well-developed and well-nourished. No distress.  Cardiovascular: Normal rate, regular rhythm and normal heart sounds.  Pulmonary/Chest: Effort normal and breath sounds normal. He has no wheezes.  Musculoskeletal: He exhibits no edema.  Neurological: He is alert and oriented to person, place, and time. No cranial nerve deficit.          Assessment & Plan:

## 2013-03-19 NOTE — Assessment & Plan Note (Signed)
Monitor LFTs and FLP.

## 2013-03-19 NOTE — Progress Notes (Signed)
Pre visit review using our clinic review tool, if applicable. No additional management support is needed unless otherwise documented below in the visit note. 

## 2013-03-19 NOTE — Assessment & Plan Note (Addendum)
Followed by Dr. Elvera LennoxGherghe.  Blood sugar control improving.  Patient advised to monitor for hypoglycemia if he starts exercise program.

## 2013-03-25 ENCOUNTER — Encounter: Payer: BC Managed Care – PPO | Attending: Internal Medicine | Admitting: *Deleted

## 2013-03-25 ENCOUNTER — Encounter: Payer: Self-pay | Admitting: *Deleted

## 2013-03-25 VITALS — Ht 77.0 in | Wt 219.6 lb

## 2013-03-25 DIAGNOSIS — IMO0002 Reserved for concepts with insufficient information to code with codable children: Secondary | ICD-10-CM

## 2013-03-25 DIAGNOSIS — Z713 Dietary counseling and surveillance: Secondary | ICD-10-CM | POA: Insufficient documentation

## 2013-03-25 DIAGNOSIS — E1149 Type 2 diabetes mellitus with other diabetic neurological complication: Secondary | ICD-10-CM

## 2013-03-25 DIAGNOSIS — E1165 Type 2 diabetes mellitus with hyperglycemia: Secondary | ICD-10-CM

## 2013-03-25 DIAGNOSIS — E119 Type 2 diabetes mellitus without complications: Secondary | ICD-10-CM | POA: Insufficient documentation

## 2013-03-25 NOTE — Patient Instructions (Addendum)
Plan:  Aim for 4 Carb Choices per meal (60 grams) +/- 1 either way  Aim for 0-2 Carbs per snack if hungry  Consider reading food labels for Total Carbohydrate of foods Consider  increasing your activity level by bicycling or other activity daily as tolerated Consider carrying Glucose Tabs with you when you are away from home while taking insulin  Continue checking BG at alternate times per day as directed by MD

## 2013-03-25 NOTE — Progress Notes (Signed)
Appt start time: 0800 end time:  0930.  Assessment:  Patient was seen on  03/25/13 for individual diabetes education. Newly diagnosed as of last month.  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 Works a lot of hours but they can be flexible. His wife buys and cooks his meals. SMBG 2-3 times a day with reported range of 100-150 mg/dl pre and post meals.He enjoys attending sporting events, he is not as active as he used to be but enjoys bike riding and lifting weights at home.  Current HbA1c: 12.1%  Preferred Learning Style:   No preference indicated   Learning Readiness:   Ready  Change in progress  MEDICATIONS: see list. Diabetes medications are Levemir and Metformin  DIETARY INTAKE:  24-hr recall:  B ( AM): regular oatmeal with 2% milk, almonds and raisins and sweetener OR 2-3eggs and 3 bacon, occasionally 1/2 grapefruit, 8 oz diet cranberry juice  Snk ( AM): no  L ( PM): eats out; salad with chicken or chef salad OR bowl of soup OR hot meal, unsweet tea or water Snk ( PM): no D ( PM): lean meat, limited starches, vegetables and/or a salad, [place] dressing, unsweet tea Snk ( PM): occasionally nuts, popcorn, ice cream or Ensure Bar infrequently Beverages: water, unsweet tea  Usual physical activity: limited now but enjoys bicycling locally  Estimated energy needs: 2100 calories 235 g carbohydrates 158 g protein 58 g fat    Intervention:  Nutrition counseling provided.  Discussed diabetes disease process and treatment options.  Discussed physiology of diabetes and role of obesity on insulin resistance.  Encouraged moderate weight management to improve glucose levels.  Discussed role of medications and diet in glucose control  Provided education on macronutrients on glucose levels.  Provided education on carb counting, importance of regularly scheduled meals/snacks, and meal planning  Discussed effects of physical activity on glucose levels and long-term glucose control.   Recommended goal of 150 minutes of physical activity/week.  Reviewed patient medications.  Discussed role of medication on blood glucose and possible side effects. Instructed on insulin action of Levemir  Discussed blood glucose monitoring and interpretation.  Discussed recommended target ranges and individual ranges.    Described short-term complications: hyper- and hypo-glycemia.  Discussed causes,symptoms, and treatment options.  Discussed prevention, detection, and treatment of long-term complications.  Discussed the role of prolonged elevated glucose levels on body systems.  Discussed role of stress on blood glucose levels and discussed strategies to manage psychosocial issues.  Discussed recommendations for long-term diabetes self-care.  Provided checklist for medical, dental, and emotional self-care.  Plan:  Aim for 4 Carb Choices per meal (60 grams) +/- 1 either way  Aim for 0-2 Carbs per snack if hungry  Consider reading food labels for Total Carbohydrate of foods Consider  increasing your activity level by bicycling or other activity daily as tolerated Consider carrying Glucose Tabs with you when you are away from home while taking insulin  Continue checking BG at alternate times per day as directed by MD    Teaching Method Utilized: Visual, Auditory and Hands on  Handouts given during visit include: Living Well with Diabetes Carb Counting and Food Label handouts Meal Plan Card  Insulin action handout  Barriers to learning/adherence to lifestyle change: none  Diabetes self-care support plan:   [facility] support [facility] offered  Demonstrated degree of understanding via:  Teach Back   Monitoring/Evaluation:  Dietary intake, exercise, reading food labels, SMBG, and body weight in 2 month(s).

## 2013-04-21 ENCOUNTER — Ambulatory Visit (INDEPENDENT_AMBULATORY_CARE_PROVIDER_SITE_OTHER): Payer: BC Managed Care – PPO | Admitting: Internal Medicine

## 2013-04-21 ENCOUNTER — Encounter: Payer: Self-pay | Admitting: Internal Medicine

## 2013-04-21 VITALS — BP 112/68 | HR 88 | Temp 98.2°F | Resp 12 | Wt 218.0 lb

## 2013-04-21 DIAGNOSIS — IMO0002 Reserved for concepts with insufficient information to code with codable children: Secondary | ICD-10-CM

## 2013-04-21 DIAGNOSIS — E1149 Type 2 diabetes mellitus with other diabetic neurological complication: Secondary | ICD-10-CM

## 2013-04-21 DIAGNOSIS — E1165 Type 2 diabetes mellitus with hyperglycemia: Principal | ICD-10-CM

## 2013-04-21 NOTE — Progress Notes (Signed)
Patient ID: Clifford EdmanRobert Steven Zellman, male   DOB: 05/21/1955, 58 y.o.   MRN: [id number]  HPI: Clifford Alvarez is a 58 y.o.-year-old male, returning for f/u for DM2 dx 01/28/2013, insulin-dependent since dx, uncontrolled, with complications (Peripheral neuropathy). Last visit was 1 mo ago.  Last hemoglobin A1c was: Lab Results  Component Value Date   HGBA1C 12.1* 01/28/2013   Pt is on a regimen of: - Metformin 500 >> 1000 mg bid - Levemir 18 >> 20 units qhs - Invpkana 100 added at last visit He injects insulin correctly, in the abdomen, keeps needle in for 10 sec after last unit of insulin in; changes needle with every use; keeps insulin in use out of the fridge. No pbs with inj sites.   Pt checks his sugars 3x a day and they are improved further: - am: 202-252 >> 138-177 >> 102-158 - 2h after b'fast: n/c >> 146 >> 197 - before lunch: n/c >> 135-158 (206) >> 102, 128 - <1h after lunch: 175-225 >> 2h after lunch: 117-136 >> 117, 143 - before dinner: n/c >> n/c - 2h after dinner: n/c - bedtime: 207-231 >> 133-200 >> 92-150s (162 and 224 after late dinner, b'day party) No lows. Lowest sugar was 92? has hypoglycemia awareness Highest sugar was 224 x 1 after cake.  Pt's meals are: - Breakfast: bagel, oatmeal, granola, eggs - Lunch: meat + 2-3 vegetables; soup; salad; sandwich - Dinner: meat + 2-3 vegetables; pasta; [place]; seafood - Snacks: 1 a day >> nuts, popcorn, dark chocolate No regular exercise.  - no CKD, last BUN/creatinine:  Lab Results  Component Value Date   BUN 23 03/19/2013   CREATININE 1.1 03/19/2013  He is on Losartan. - He has HL. last set of lipids: Lab Results  Component Value Date   CHOL 128 03/19/2013   HDL 33.00* 03/19/2013   LDLCALC 50 03/19/2013   LDLDIRECT 161.0 01/28/2013   TRIG 226.0* 03/19/2013   CHOLHDL 4 03/19/2013  He is on Lipitor. - last eye exam was in 11/2012. No DR. Has glaucoma. + blurry vision.  - no numbness and tingling in his feet. Foot exam  01/28/2013: neuropathy L  I reviewed pt's medications, allergies, PMH, social hx, family hx and no changes required, except as mentioned above.  ROS: Constitutional: no weight loss, no fatigue, no subjective hyperthermia/hypothermia, + nocturia x1 Eyes: no blurry vision, no xerophthalmia ENT: no sore throat, no nodules palpated in throat, no dysphagia/odynophagia, no hoarseness;  Cardiovascular: no CP/SOB/palpitations/leg swelling Respiratory: no cough/no SOB/wheezing Gastrointestinal: no N/V/D/C Musculoskeletal: no muscle/joint aches Skin: no rashes Neurological: no tremors/numbness/tingling/dizziness, no HA  PE: BP 112/68  Pulse 88  Temp(Src) 98.2 F (36.8 C) (Oral)  Resp 12  Wt 218 lb (98.884 kg)  SpO2 95% Body mass index is 25.85 kg/(m^2).  Wt Readings from Last 3 Encounters:  04/21/13 218 lb (98.884 kg)  03/25/13 219 lb 9.6 oz (99.61 kg)  03/19/13 220 lb (99.791 kg)   Constitutional: normal weight, in NAD Eyes: PERRLA, EOMI, no exophthalmos ENT: moist mucous membranes, no thyromegaly, no cervical lymphadenopathy Cardiovascular: RRR, No MRG Respiratory: CTA B Gastrointestinal: abdomen soft, NT, ND, BS+ Musculoskeletal: no deformities, strength intact in all 4 Skin: moist, warm, no rashes  ASSESSMENT: 1. DM2, insulin-dependent, uncontrolled, with complications  - PN  PLAN:  1. Patient with recent dx of diabetes, on basal insulin + now maximal metformin dose + Invokana added at last visit, with improved control. - I suggested to Continue Levemir 20 units  at night. Continue Metformin 1000 mg 2x a day. Continue Invokana 100 mg daily in am.  - he just saw Bristol-Myers SquibbBeverly Paddock (nutrition) and this helped a lot, he mentions  - he may be able to come off insulin in the future - continue checking sugars at different times of the day - check 2-3 times a day, rotating checks - he is up to date with yearly eye exams - check HbA1c in 1 week - Return to clinic in 3 mo with  sugar log

## 2013-04-21 NOTE — Patient Instructions (Signed)
Please continue the same regimen. KEEP UP THE GREAT WORK! Please come back for a follow-up appointment in 3 months. Please come back in 1 week for lab.

## 2013-05-06 ENCOUNTER — Other Ambulatory Visit (INDEPENDENT_AMBULATORY_CARE_PROVIDER_SITE_OTHER): Payer: BC Managed Care – PPO

## 2013-05-06 DIAGNOSIS — E1165 Type 2 diabetes mellitus with hyperglycemia: Principal | ICD-10-CM

## 2013-05-06 DIAGNOSIS — E1149 Type 2 diabetes mellitus with other diabetic neurological complication: Secondary | ICD-10-CM

## 2013-05-06 DIAGNOSIS — IMO0002 Reserved for concepts with insufficient information to code with codable children: Secondary | ICD-10-CM

## 2013-05-06 LAB — HEMOGLOBIN A1C: Hgb A1c MFr Bld: 6.9 % — ABNORMAL HIGH (ref 4.6–6.5)

## 2013-05-12 ENCOUNTER — Ambulatory Visit: Payer: BC Managed Care – PPO | Admitting: *Deleted

## 2013-05-14 ENCOUNTER — Encounter: Payer: BC Managed Care – PPO | Attending: Internal Medicine | Admitting: *Deleted

## 2013-05-14 ENCOUNTER — Encounter: Payer: Self-pay | Admitting: *Deleted

## 2013-05-14 VITALS — Ht 77.0 in | Wt 219.0 lb

## 2013-05-14 DIAGNOSIS — E1149 Type 2 diabetes mellitus with other diabetic neurological complication: Secondary | ICD-10-CM

## 2013-05-14 DIAGNOSIS — E119 Type 2 diabetes mellitus without complications: Secondary | ICD-10-CM | POA: Insufficient documentation

## 2013-05-14 DIAGNOSIS — E1165 Type 2 diabetes mellitus with hyperglycemia: Secondary | ICD-10-CM

## 2013-05-14 DIAGNOSIS — IMO0002 Reserved for concepts with insufficient information to code with codable children: Secondary | ICD-10-CM

## 2013-05-14 DIAGNOSIS — Z713 Dietary counseling and surveillance: Secondary | ICD-10-CM | POA: Insufficient documentation

## 2013-05-14 NOTE — Progress Notes (Signed)
Appt start time: 0730 end time:  0800.  Assessment:  Patient was seen on  05/14/13 for individual diabetes education follow up visit. He is here with his wife who appears supportive. He is doing well with medication and carb counting management. He states his A1c is now down to 6.9%!  He continues to check his BG and reports all BG's are within target ranges. He is not exercising actively, but would like to resume this summer.  Current HbA1c: 12.1%, now down to 6.9%  Preferred Learning Style:   No preference indicated   Learning Readiness:   Ready  Change in progress  MEDICATIONS: see list. Diabetes medications are Levemir and Metformin and Invokana  DIETARY INTAKE:  24-hr recall:  B ( AM): regular oatmeal with 2% milk, almonds and raisins and sweetener OR 2-3eggs and 3 bacon, occasionally 1/2 grapefruit, 8 oz diet cranberry juice  Snk ( AM): no  L ( PM): eats out; salad with chicken or chef salad OR bowl of soup OR hot meal, unsweet tea or water Snk ( PM): no D ( PM): lean meat, limited starches, vegetables and/or a salad, [place] dressing, unsweet tea Snk ( PM): occasionally nuts, popcorn, ice cream or Ensure Bar infrequently Beverages: water, unsweet tea  Usual physical activity: limited now but enjoys bicycling locally  Estimated energy needs: 2100 calories 235 g carbohydrates 158 g protein 58 g fat    Intervention:  Nutrition counseling provided. Reviewed action of all of his diabetes medications Commended him on omitting all regular sodas and sweet tea since diagnosis Demonstrated relationship of BG targets pre and post meal and how they relate to A1c results.  Plan:  Aim for 4 Carb Choices per meal (60 grams) +/- 1 either way  Aim for 0-2 Carbs per snack if hungry  Continue reading food labels for Total Carbohydrate of foods Consider  increasing your activity level by bicycling or other activity daily as tolerated Consider carrying Glucose Tabs with you when you  are away from home while taking insulin  Continue checking BG at alternate times per day as directed by MD    Teaching Method Utilized: Visual, Auditory and Hands on  Handouts given during visit include:  No new handouts at this visit  Barriers to learning/adherence to lifestyle change: none  Diabetes self-care support plan:   [facility] support group offered  Demonstrated degree of understanding via:  Teach Back   Monitoring/Evaluation:  Dietary intake, exercise, reading food labels, SMBG, and body weight as needed.

## 2013-06-15 ENCOUNTER — Other Ambulatory Visit: Payer: Self-pay | Admitting: Internal Medicine

## 2013-07-17 ENCOUNTER — Other Ambulatory Visit: Payer: Self-pay | Admitting: Internal Medicine

## 2013-08-04 ENCOUNTER — Encounter: Payer: Self-pay | Admitting: Internal Medicine

## 2013-08-04 ENCOUNTER — Ambulatory Visit (INDEPENDENT_AMBULATORY_CARE_PROVIDER_SITE_OTHER): Payer: Managed Care, Other (non HMO) | Admitting: Internal Medicine

## 2013-08-04 VITALS — BP 110/64 | HR 75 | Temp 98.0°F | Resp 12 | Wt 221.0 lb

## 2013-08-04 DIAGNOSIS — IMO0002 Reserved for concepts with insufficient information to code with codable children: Secondary | ICD-10-CM

## 2013-08-04 DIAGNOSIS — E1149 Type 2 diabetes mellitus with other diabetic neurological complication: Secondary | ICD-10-CM

## 2013-08-04 DIAGNOSIS — E1165 Type 2 diabetes mellitus with hyperglycemia: Principal | ICD-10-CM

## 2013-08-04 NOTE — Patient Instructions (Signed)
Continue Levemir 20 units at night. Continue Metformin 1000 mg 2x a day. Continue Invokana 100 mg daily in am.  Please come to the lab in 3-4 days.  Please check sugars in the middle of the day, too.  Please come back for a follow-up appointment in 3 months with your sugar log.

## 2013-08-04 NOTE — Progress Notes (Signed)
Patient ID: Clifford Alvarez, male   DOB: 06/30/1955, 58 y.o.   MRN: [id number]  HPI: Clifford Alvarez is a 58 y.o.-year-old male, returning for f/u for DM2 dx 01/28/2013, insulin-dependent since dx, uncontrolled, with complications (Peripheral neuropathy). Last visit was 3.5 mo ago.  Last hemoglobin A1c was greatly decreased: Lab Results  Component Value Date   HGBA1C 6.9* 05/06/2013   HGBA1C 12.1* 01/28/2013   Pt is on a regimen of: - Metformin 1000 mg bid - Levemir 20 units qhs - Invokana 100 mg in am He injects insulin correctly.  Pt checks his sugars 2x a day and they are still at goal: - am: 202-252 >> 138-177 >> 102-158 >> 103-143 - 2h after b'fast: n/c >> 146 >> 197 >> n/c - before lunch: n/c >> 135-158 (206) >> 102, 128 >> n/c - <1h after lunch: 175-225 >> 2h after lunch: 117-136 >> 117, 143 >> 104-176 >> n/c - before dinner: n/c >> n/c - 2h after dinner: n/c - bedtime: 207-231 >> 133-200 >> 92-150s (162 and 224 after late dinner, b'day party) >> 104-176 No lows. Lowest sugar was 104,  has hypoglycemia awareness Highest sugar was 176 (30 min after dinner)  Pt's meals are: - Breakfast: bagel, oatmeal, granola, eggs - Lunch: meat + 2-3 vegetables; soup; salad; sandwich - Dinner: meat + 2-3 vegetables; pasta; [place]; seafood - Snacks: 1 a day >> nuts, popcorn, dark chocolate No regular exercise.  - has mild CKD, last BUN/creatinine:  Lab Results  Component Value Date   BUN 23 03/19/2013   CREATININE 1.1 03/19/2013  He is on Losartan. - He has HL. last set of lipids: Lab Results  Component Value Date   CHOL 128 03/19/2013   HDL 33.00* 03/19/2013   LDLCALC 50 03/19/2013   LDLDIRECT 161.0 01/28/2013   TRIG 226.0* 03/19/2013   CHOLHDL 4 03/19/2013  He is on Lipitor. - last eye exam was in 11/2012. No DR. Has glaucoma. + blurry vision.  - no numbness and tingling in his feet. Foot exam 01/28/2013: neuropathy L  I reviewed pt's medications, allergies, PMH, social hx,  family hx and no changes required, except as mentioned above.  ROS: Constitutional: no weight loss, no fatigue, no subjective hyperthermia/hypothermia Eyes: no blurry vision, no xerophthalmia ENT: no sore throat, no nodules palpated in throat, no dysphagia/odynophagia, no hoarseness;  Cardiovascular: no CP/SOB/palpitations/leg swelling Respiratory: no cough/no SOB/wheezing Gastrointestinal: no N/V/D/C Musculoskeletal: no muscle/joint aches Skin: no rashes Neurological: no tremors/numbness/tingling/dizziness, no HA  PE: BP 110/64  Pulse 75  Temp(Src) 98 F (36.7 C) (Oral)  Resp 12  Wt 221 lb (100.245 kg)  SpO2 97% Body mass index is 26.2 kg/(m^2).  Wt Readings from Last 3 Encounters:  05/14/13 219 lb (99.338 kg)  04/21/13 218 lb (98.884 kg)  03/25/13 219 lb 9.6 oz (99.61 kg)   Constitutional: normal weight, in NAD Eyes: PERRLA, EOMI, no exophthalmos ENT: moist mucous membranes, no thyromegaly, no cervical lymphadenopathy Cardiovascular: RRR, No MRG Respiratory: CTA B Gastrointestinal: abdomen soft, NT, ND, BS+ Musculoskeletal: no deformities, strength intact in all 4 Skin: moist, warm, no rashes  ASSESSMENT: 1. DM2, insulin-dependent, uncontrolled, with complications  - PN  PLAN:  1. Patient with recent dx of diabetes, on basal insulin + now maximal metformin dose + Invokana, with very good control. - I suggested to Continue Levemir 20 units at night. Continue Metformin 1000 mg 2x a day. Continue Invokana 100 mg daily in am. - he may be able to come  off insulin in the future, but not for now - continue checking sugars at different times of the day - check 2 times a day, rotating checks - I advised him to check in the middle of the day, too - he is up to date with yearly eye exams - check HbA1c in 4 days (not due yet) - will order. Also, check BMP since he is on Invokana and Cozaar both of which can increase potassium levels - Return to clinic in 3 mo with sugar  log   Lab Results  Component Value Date   HGBA1C 6.5 08/08/2013   Excellent A1c!

## 2013-08-08 ENCOUNTER — Other Ambulatory Visit (INDEPENDENT_AMBULATORY_CARE_PROVIDER_SITE_OTHER): Payer: Managed Care, Other (non HMO)

## 2013-08-08 DIAGNOSIS — IMO0002 Reserved for concepts with insufficient information to code with codable children: Secondary | ICD-10-CM

## 2013-08-08 DIAGNOSIS — E1149 Type 2 diabetes mellitus with other diabetic neurological complication: Secondary | ICD-10-CM

## 2013-08-08 DIAGNOSIS — E1165 Type 2 diabetes mellitus with hyperglycemia: Principal | ICD-10-CM

## 2013-08-08 LAB — BASIC METABOLIC PANEL
BUN: 17 mg/dL (ref 6–23)
CHLORIDE: 101 meq/L (ref 96–112)
CO2: 26 meq/L (ref 19–32)
Calcium: 9.1 mg/dL (ref 8.4–10.5)
Creatinine, Ser: 1 mg/dL (ref 0.4–1.5)
GFR: 84.1 mL/min (ref >60.00)
GLUCOSE: 134 mg/dL — AB (ref 70–99)
POTASSIUM: 3.9 meq/L (ref 3.5–5.1)
SODIUM: 138 meq/L (ref 135–145)

## 2013-08-08 LAB — HEMOGLOBIN A1C: Hgb A1c MFr Bld: 6.5 % (ref 4.6–6.5)

## 2013-09-15 ENCOUNTER — Ambulatory Visit: Payer: Managed Care, Other (non HMO) | Admitting: Internal Medicine

## 2013-09-15 ENCOUNTER — Other Ambulatory Visit: Payer: Self-pay | Admitting: Internal Medicine

## 2013-09-24 ENCOUNTER — Other Ambulatory Visit: Payer: Self-pay | Admitting: Internal Medicine

## 2013-10-10 ENCOUNTER — Ambulatory Visit: Payer: Managed Care, Other (non HMO) | Admitting: Internal Medicine

## 2013-10-21 ENCOUNTER — Other Ambulatory Visit: Payer: Self-pay | Admitting: Internal Medicine

## 2013-11-03 ENCOUNTER — Other Ambulatory Visit: Payer: Self-pay | Admitting: Internal Medicine

## 2013-11-03 ENCOUNTER — Ambulatory Visit (INDEPENDENT_AMBULATORY_CARE_PROVIDER_SITE_OTHER): Payer: Managed Care, Other (non HMO) | Admitting: Internal Medicine

## 2013-11-03 ENCOUNTER — Other Ambulatory Visit (INDEPENDENT_AMBULATORY_CARE_PROVIDER_SITE_OTHER): Payer: Managed Care, Other (non HMO) | Admitting: *Deleted

## 2013-11-03 ENCOUNTER — Encounter: Payer: Self-pay | Admitting: Internal Medicine

## 2013-11-03 VITALS — BP 118/62 | HR 88 | Temp 97.7°F | Resp 12 | Wt 222.0 lb

## 2013-11-03 DIAGNOSIS — IMO0002 Reserved for concepts with insufficient information to code with codable children: Secondary | ICD-10-CM

## 2013-11-03 DIAGNOSIS — E1149 Type 2 diabetes mellitus with other diabetic neurological complication: Secondary | ICD-10-CM

## 2013-11-03 DIAGNOSIS — E1165 Type 2 diabetes mellitus with hyperglycemia: Principal | ICD-10-CM

## 2013-11-03 DIAGNOSIS — Z23 Encounter for immunization: Secondary | ICD-10-CM

## 2013-11-03 NOTE — Progress Notes (Signed)
Patient ID: Clifford EdmanRobert Steven Odden, male   DOB: 08/06/1955, 58 y.o.   MRN: [id number]  HPI: Clifford Alvarez is a 58 y.o.-year-old male, returning for f/u for DM2 dx 01/28/2013, insulin-dependent since dx, uncontrolled, with complications (Peripheral neuropathy). Last visit was 3 mo ago.  Last hemoglobin A1c was greatly decreased: Lab Results  Component Value Date   HGBA1C 6.5 08/08/2013   HGBA1C 6.9* 05/06/2013   HGBA1C 12.1* 01/28/2013   Pt is on a regimen of: - Metformin 1000 mg bid - Levemir 20 units qhs - Invokana 100 mg in am He injects insulin correctly.  Pt checks his sugars 2x a day and they are still at goal: - am: 202-252 >> 138-177 >> 102-158 >> 103-143 >> 91-130 - 2h after b'fast: n/c >> 146 >> 197 >> n/c - before lunch: n/c >> 135-158 (206) >> 102, 128 >> n/c - <1h after lunch: 175-225 >> 2h after lunch: 117-136 >> 117, 143 >> 104-176 >> n/c >> 126 - before dinner: n/c >> n/c - 2h after dinner: n/c - bedtime: 207-231 >> 133-200 >> 92-150s (162 and 224 after late dinner, b'day party) >> 104-176 >> 90-130, 172 No lows. Lowest sugar was 104,  has hypoglycemia awareness Highest sugar was 176 (30 min after dinner) >> 172 now  Pt's meals are: - Breakfast: bagel, oatmeal, granola, eggs - Lunch: meat + 2-3 vegetables; soup; salad; sandwich - Dinner: meat + 2-3 vegetables; pasta; [place]; seafood - Snacks: 1 a day >> nuts, popcorn, dark chocolate No regular exercise.  - has mild CKD, last BUN/creatinine:  Lab Results  Component Value Date   BUN 17 08/08/2013   CREATININE 1.0 08/08/2013  He is on Losartan. - He has HL. last set of lipids: Lab Results  Component Value Date   CHOL 128 03/19/2013   HDL 33.00* 03/19/2013   LDLCALC 50 03/19/2013   LDLDIRECT 161.0 01/28/2013   TRIG 226.0* 03/19/2013   CHOLHDL 4 03/19/2013  He is on Lipitor. - last eye exam was in 11/2012. No DR. Has glaucoma. + blurry vision. Dr. Lottie DawsonBond. - no numbness and tingling in his feet. Foot  exam 01/28/2013: neuropathy L  I reviewed pt's medications, allergies, PMH, social hx, family hx and no changes required, except as mentioned above.  ROS: Constitutional: no weight loss, no fatigue, no subjective hyperthermia/hypothermia Eyes: no blurry vision, no xerophthalmia ENT: no sore throat, no nodules palpated in throat, no dysphagia/odynophagia, no hoarseness;  Cardiovascular: no CP/SOB/palpitations/leg swelling Respiratory: no cough/no SOB/wheezing Gastrointestinal: no N/V/D/C Musculoskeletal: no muscle/joint aches Skin: no rashes Neurological: no tremors/numbness/tingling/dizziness, no HA  PE: BP 118/62 mmHg  Pulse 88  Temp(Src) 97.7 F (36.5 C) (Oral)  Resp 12  Wt 222 lb (100.699 kg)  SpO2 97% Body mass index is 26.32 kg/(m^2).  Wt Readings from Last 3 Encounters:  11/03/13 222 lb (100.699 kg)  08/04/13 221 lb (100.245 kg)  05/14/13 219 lb (99.338 kg)   Constitutional: normal weight, in NAD Eyes: PERRLA, EOMI, no exophthalmos ENT: moist mucous membranes, no thyromegaly, no cervical lymphadenopathy Cardiovascular: RRR, No MRG Respiratory: CTA B Gastrointestinal: abdomen soft, NT, ND, BS+ Musculoskeletal: no deformities, strength intact in all 4 Skin: moist, warm, no rashes  ASSESSMENT: 1. DM2, insulin-dependent, uncontrolled, with complications  - PN  PLAN:  1. Patient with relatively recent dx of diabetes, on basal insulin + maximal metformin dose + Invokana, with very good control. - I suggested to Please decrease Levemir to 15 units at night. Continue Metformin 1000 mg  2x a day. Continue Invokana 100 mg daily in am. - he may be able to come off insulin in the future - continue checking sugars at different times of the day - check 2 times a day, rotating checks  - he is up to date with yearly eye exams >> has one coming up - check HbA1c in 1 week - will give flu vaccine today - Return to clinic in 3 mo with sugar log

## 2013-11-03 NOTE — Patient Instructions (Signed)
Please decrease Levemir to 15 units at night. Continue Metformin 1000 mg 2x a day. Continue Invokana 100 mg daily in am.  Please return in 3 months with your sugar log.   Please come for labs on or after 11/10/2013.

## 2013-11-10 ENCOUNTER — Other Ambulatory Visit (INDEPENDENT_AMBULATORY_CARE_PROVIDER_SITE_OTHER): Payer: Managed Care, Other (non HMO)

## 2013-11-10 ENCOUNTER — Other Ambulatory Visit: Payer: Self-pay | Admitting: Internal Medicine

## 2013-11-10 DIAGNOSIS — E1149 Type 2 diabetes mellitus with other diabetic neurological complication: Secondary | ICD-10-CM

## 2013-11-10 DIAGNOSIS — IMO0002 Reserved for concepts with insufficient information to code with codable children: Secondary | ICD-10-CM

## 2013-11-10 DIAGNOSIS — E1165 Type 2 diabetes mellitus with hyperglycemia: Principal | ICD-10-CM

## 2013-11-10 LAB — HEMOGLOBIN A1C: HEMOGLOBIN A1C: 6.1 % (ref 4.6–6.5)

## 2013-11-17 ENCOUNTER — Other Ambulatory Visit: Payer: Self-pay | Admitting: Internal Medicine

## 2013-11-17 ENCOUNTER — Encounter: Payer: Self-pay | Admitting: Internal Medicine

## 2013-11-17 ENCOUNTER — Ambulatory Visit (INDEPENDENT_AMBULATORY_CARE_PROVIDER_SITE_OTHER): Payer: Managed Care, Other (non HMO) | Admitting: Internal Medicine

## 2013-11-17 VITALS — BP 116/80 | HR 64 | Temp 97.6°F | Ht 77.0 in | Wt 223.0 lb

## 2013-11-17 DIAGNOSIS — E1149 Type 2 diabetes mellitus with other diabetic neurological complication: Secondary | ICD-10-CM

## 2013-11-17 DIAGNOSIS — IMO0002 Reserved for concepts with insufficient information to code with codable children: Secondary | ICD-10-CM

## 2013-11-17 DIAGNOSIS — E1165 Type 2 diabetes mellitus with hyperglycemia: Principal | ICD-10-CM

## 2013-11-17 DIAGNOSIS — I10 Essential (primary) hypertension: Secondary | ICD-10-CM

## 2013-11-17 NOTE — Progress Notes (Signed)
Pre visit review using our clinic review tool, if applicable. No additional management support is needed unless otherwise documented below in the visit note. 

## 2013-11-17 NOTE — Assessment & Plan Note (Addendum)
Patient's diabetes control has significantly improved. His last A1c 6.1. His endocrinologist gradually tapering off Levemir.  Patient will likely be able to control his diabetes with oral agents alone.  Lab Results  Component Value Date   HGBA1C 6.1 11/10/2013   HGBA1C 6.5 08/08/2013   HGBA1C 6.9* 05/06/2013   Lab Results  Component Value Date   MICROALBUR 0.5 03/12/2013   LDLCALC 50 03/19/2013   CREATININE 1.0 08/08/2013

## 2013-11-17 NOTE — Assessment & Plan Note (Signed)
BP at goal. Lab Results  Component Value Date   CREATININE 1.0 08/08/2013   Lab Results  Component Value Date   NA 138 08/08/2013   K 3.9 08/08/2013   CL 101 08/08/2013   CO2 26 08/08/2013    BP: 116/80 mmHg

## 2013-11-17 NOTE — Patient Instructions (Addendum)
Please complete the following lab tests in 3 months. BMET, A1c - 250.00 Contact Dr. Matthias HughsBuccini re: follow up colonoscopy

## 2013-11-17 NOTE — Progress Notes (Signed)
Subjective:    Patient ID: Clifford Alvarez, male    DOB: 09/19/1955, 58 y.o.   MRN: [id number]  HPI  58 year old white male with history of type 2 diabetes, hypertension and hyperlipidemia for routine follow-up. It has been 12 months since he was first diagnosed with type 2 diabetes. Patient followed by endocrinology. Patient able to significantly change his lifestyle. In addition he has had good response to basal insulin and oral diabetic agents. His most recent A1c now down to 6.1. He reports his morning blood sugars usually between 95 and 130.    Review of Systems Negative for chest pain or shortness of breath    Past Medical History  Diagnosis Date  . Heart murmur     History of childhood heart murmur (Aortic)  . Glaucoma   . Diabetes mellitus without complication     History   Social History  . Marital Status: Married    Spouse Name: Debbie    Number of Children: 2  . Years of Education: N/A   Occupational History  . 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 Smoking status: Never Smoker   . Smokeless tobacco: Not on file  . Alcohol Use: No  . Drug Use: No  . Sexual Activity: Not on file   Other Topics Concern  . Not on file   Social History Narrative   Married 38 years   Wife - Eunice BlaseDebbie   Son [id number]   Daughter [id number]    No past surgical history on file.  Family History  Problem Relation Age of Onset  . Diabetes Mother   . Diabetes Father   . Heart disease Father   . Diabetes Brother   . Diabetes Sister   . Hypertension Mother     Allergies  Allergen Reactions  . Penicillins     As a child    Current Outpatient Prescriptions on File Prior to Visit  Medication Sig Dispense Refill  . atorvastatin (LIPITOR) 20 MG tablet TAKE 1 TABLET (20 MG TOTAL) BY MOUTH DAILY. 90 tablet 1  . BAYER CONTOUR NEXT TEST test strip TEST 3 (THREE) TIMES DAILY. USE AS INSTRUCTED (Patient taking differently: TEST TWO TIMES DAILY. USE AS  INSTRUCTED) 100 each 5  . dorzolamide-timolol (COSOPT) 22.3-6.8 MG/ML ophthalmic solution Place 1 drop into both eyes 2 (two) times daily.    . Insulin Pen Needle (BD PEN NEEDLE NANO U/F) 32G X 4 MM MISC 1 each by Does not apply route at bedtime. 50 each 5  . latanoprost (XALATAN) 0.005 % ophthalmic solution Place 1 drop into both eyes at bedtime.     Marland Kitchen. LEVEMIR FLEXTOUCH 100 UNIT/ML Pen INJECT 20 UNITS EVERY DAY AT 10PM (Patient taking differently: INJECT 15 UNITS EVERY DAY AT 10PM) 15 mL 0  . losartan (COZAAR) 25 MG tablet TAKE 1 TABLET (25 MG TOTAL) BY MOUTH DAILY. 90 tablet 1  . metFORMIN (GLUCOPHAGE) 500 MG tablet TAKE 2 TABLETS (1,000 MG TOTAL) BY MOUTH 2 (TWO) TIMES DAILY WITH A MEAL. 180 tablet 1   No current facility-administered medications on file prior to visit.    BP 116/80 mmHg  Pulse 64  Temp(Src) 97.6 F (36.4 C) (Oral)  Ht 6\' 5"  (1.956 m)  Wt 223 lb (101.152 kg)  BMI 26.44 kg/m2    Objective:   Physical Exam  Constitutional: He is oriented to person, place, and time. He appears well-developed and well-nourished.  Cardiovascular:  Normal rate, regular rhythm and normal heart sounds.   No murmur heard. Pulmonary/Chest: Effort normal and breath sounds normal.  Musculoskeletal: He exhibits no edema.  Neurological: He is alert and oriented to person, place, and time. No cranial nerve deficit.  Psychiatric: He has a normal mood and affect. His behavior is normal.          Assessment & Plan:

## 2013-12-25 ENCOUNTER — Other Ambulatory Visit: Payer: Self-pay | Admitting: Internal Medicine

## 2013-12-25 MED ORDER — INSULIN DETEMIR 100 UNIT/ML FLEXPEN: PEN_INJECTOR | SUBCUTANEOUS | Status: DC

## 2013-12-25 NOTE — Telephone Encounter (Signed)
Pt request refill of the following: LEVEMIR FLEXTOUCH 100 UNIT/ML Pen   Phamacy:  [facility]    Pt said he is leaving the country Sunday and need this asap.

## 2013-12-25 NOTE — Telephone Encounter (Signed)
Rx sent to pharmacy   

## 2014-01-11 ENCOUNTER — Other Ambulatory Visit: Payer: Self-pay | Admitting: Internal Medicine

## 2014-02-11 ENCOUNTER — Encounter: Payer: Self-pay | Admitting: Internal Medicine

## 2014-02-11 ENCOUNTER — Ambulatory Visit (INDEPENDENT_AMBULATORY_CARE_PROVIDER_SITE_OTHER): Payer: Managed Care, Other (non HMO) | Admitting: Internal Medicine

## 2014-02-11 VITALS — BP 104/66 | Temp 98.0°F | Resp 12 | Wt 219.0 lb

## 2014-02-11 DIAGNOSIS — IMO0002 Reserved for concepts with insufficient information to code with codable children: Secondary | ICD-10-CM

## 2014-02-11 DIAGNOSIS — E1165 Type 2 diabetes mellitus with hyperglycemia: Principal | ICD-10-CM

## 2014-02-11 DIAGNOSIS — E1149 Type 2 diabetes mellitus with other diabetic neurological complication: Secondary | ICD-10-CM

## 2014-02-11 LAB — HEMOGLOBIN A1C: HEMOGLOBIN A1C: 6.3 % (ref 4.6–6.5)

## 2014-02-11 NOTE — Progress Notes (Signed)
Patient ID: Clifford Alvarez, male   DOB: 19-Dec-1955, 59 y.o.   MRN: [id number]  HPI: Clifford Alvarez is a 59 y.o.-year-old male, returning for f/u for DM2 dx 01/28/2013, insulin-dependent since dx, uncontrolled, with complications (Peripheral neuropathy). Last visit was 3.5 mo ago.  Last hemoglobin A1c: Lab Results  Component Value Date   HGBA1C 6.1 11/10/2013   HGBA1C 6.5 08/08/2013   HGBA1C 6.9* 05/06/2013   Pt is on a regimen of: - Levemir 20 >> 15 units qhs  - Metformin 1000 mg bid - Invokana 100 mg in am He injects insulin correctly.  Pt checks his sugars 2x a day and they are still at goal: - am: 202-252 >> 138-177 >> 102-158 >> 103-143 >> 91-130 >> 104-130 (up to 140 on 8 units of Levemir - was trying to ration it when in vacation in [place] in December) - 2h after b'fast: n/c >> 146 >> 197 >> n/c - before lunch: n/c >> 135-158 (206) >> 102, 128 >> n/c - 2h after lunch: 117-136 >> 117, 143 >> 104-176 >> n/c >> 126 >> n/c - before dinner: n/c >> n/c - 2h after dinner: n/c - bedtime: 92-150s >> 104-176 >> 90-130, 172 >> 97, 101-154 No lows. Lowest sugar was 97,  has hypoglycemia awareness. Highest sugar was 154.  Pt's meals are: - Breakfast: bagel, oatmeal, granola, eggs - Lunch: meat + 2-3 vegetables; soup; salad; sandwich - Dinner: meat + 2-3 vegetables; pasta; [place]; seafood - Snacks: 1 a day >> nuts, popcorn, dark chocolate No regular exercise.  - has mild CKD, last BUN/creatinine:  Lab Results  Component Value Date   BUN 17 08/08/2013   CREATININE 1.0 08/08/2013  He is on Losartan. - He has HL. last set of lipids: Lab Results  Component Value Date   CHOL 128 03/19/2013   HDL 33.00* 03/19/2013   LDLCALC 50 03/19/2013   LDLDIRECT 161.0 01/28/2013   TRIG 226.0* 03/19/2013   CHOLHDL 4 03/19/2013  He is on Lipitor. - last eye exam was in 12/2013. No DR. Has glaucoma. Occasional + blurry vision. Dr. Lottie Dawson. - no numbness and tingling in his  feet. Foot exam 01/28/2013: neuropathy L  I reviewed pt's medications, allergies, PMH, social hx, family hx, and changes were documented in the history of present illness. Otherwise, unchanged from my initial visit note.  ROS: Constitutional: no weight loss, no fatigue, no subjective hyperthermia/hypothermia Eyes: + blurry vision, no xerophthalmia ENT: no sore throat, no nodules palpated in throat, no dysphagia/odynophagia, no hoarseness;  Cardiovascular: no CP/SOB/palpitations/leg swelling Respiratory: no cough/no SOB/wheezing Gastrointestinal: no N/V/D/C Musculoskeletal: no muscle/joint aches Skin: no rashes Neurological: no tremors/numbness/tingling/dizziness, no HA  PE: BP 104/66 mmHg  Temp(Src) 98 F (36.7 C) (Oral)  Resp 12  Wt 219 lb (99.338 kg) Body mass index is 25.96 kg/(m^2).  Wt Readings from Last 3 Encounters:  02/11/14 219 lb (99.338 kg)  11/17/13 223 lb (101.152 kg)  11/03/13 222 lb (100.699 kg)   Constitutional: normal weight, in NAD Eyes: PERRLA, EOMI, no exophthalmos ENT: moist mucous membranes, no thyromegaly, no cervical lymphadenopathy Cardiovascular: RRR, No MRG Respiratory: CTA B Gastrointestinal: abdomen soft, NT, ND, BS+ Musculoskeletal: no deformities, strength intact in all 4 Skin: moist, warm, no rashes  ASSESSMENT: 1. DM2, insulin-dependent, uncontrolled, with complications  - PN  PLAN:  1. Patient with relatively recent dx of diabetes, on basal insulin + maximal metformin dose + Invokana, with very good control, despite gradually reducing his insulin. At this  visit, sugars stable, at goal. We will keep the insulin at the same dose, may be able to decrease it over the summer. - I suggested to Patient Instructions  Please continue: - Levemir 15 units at bedtime - Metformin 1000 mg 2x a day with meals - Invokana 100 mg in am  Please stop at the lab.  Please return in 3 months with your sugar log.   - he may be able to come off insulin in  the future - continue checking sugars at different times of the day - check 2 times a day, rotating checks  - he is up to date with yearly eye exams - will check HbA1c today - had flu vaccine this season - Return to clinic in 3 mo with sugar log   Office Visit on 02/11/2014  Component Date Value Ref Range Status  . Hgb A1c MFr Bld 02/11/2014 6.3  4.6 - 6.5 % Final   Glycemic Control Guidelines for People with Diabetes:Non Diabetic:  <6%Goal of Therapy: <7%Additional Action Suggested:  >8%    HbA1c very good!

## 2014-02-11 NOTE — Patient Instructions (Addendum)
Please continue: - Levemir 15 units at bedtime - Metformin 1000 mg 2x a day with meals - Invokana 100 mg in am  Please stop at the lab.  Please return in 3 months with your sugar log.

## 2014-02-16 ENCOUNTER — Telehealth: Payer: Self-pay | Admitting: Internal Medicine

## 2014-02-16 NOTE — Telephone Encounter (Signed)
PA request was received for Contour Strips.  Patient's plan prefers with NO PA required the following:  Breeze 2 strips- $109.12, Ketone-$16.89 and Diastix Reagent- $11.44.

## 2014-02-17 NOTE — Telephone Encounter (Signed)
I haven't heard of Ketone or Diastix.  Does he need new meter.  Where can he can new meter.  If meter not expensive, then I suggest he try either Ketone or Diastix for just 1 month to see whether he gets reliable blood sugar testing results.

## 2014-02-19 MED ORDER — GLUCOSE URINE TEST-GLUCOSE OX VI STRP: 1.0000 | ORAL_STRIP | Freq: Three times a day (TID) | Status: DC

## 2014-02-19 NOTE — Telephone Encounter (Signed)
Diastix strips sent in electronically

## 2014-02-20 ENCOUNTER — Encounter: Payer: Self-pay | Admitting: Internal Medicine

## 2014-03-17 ENCOUNTER — Other Ambulatory Visit: Payer: Self-pay | Admitting: *Deleted

## 2014-03-17 ENCOUNTER — Other Ambulatory Visit: Payer: Self-pay | Admitting: Internal Medicine

## 2014-03-17 MED ORDER — INSULIN PEN NEEDLE 32G X 4 MM MISC: 1.0000 | Freq: Every day | Status: DC

## 2014-04-07 ENCOUNTER — Other Ambulatory Visit: Payer: Self-pay | Admitting: Internal Medicine

## 2014-04-23 ENCOUNTER — Other Ambulatory Visit: Payer: Self-pay | Admitting: Internal Medicine

## 2014-05-11 ENCOUNTER — Other Ambulatory Visit (INDEPENDENT_AMBULATORY_CARE_PROVIDER_SITE_OTHER): Payer: Managed Care, Other (non HMO)

## 2014-05-11 DIAGNOSIS — IMO0002 Reserved for concepts with insufficient information to code with codable children: Secondary | ICD-10-CM

## 2014-05-11 DIAGNOSIS — Z125 Encounter for screening for malignant neoplasm of prostate: Secondary | ICD-10-CM | POA: Diagnosis not present

## 2014-05-11 DIAGNOSIS — E1165 Type 2 diabetes mellitus with hyperglycemia: Secondary | ICD-10-CM

## 2014-05-11 DIAGNOSIS — Z0000 Encounter for general adult medical examination without abnormal findings: Secondary | ICD-10-CM | POA: Diagnosis not present

## 2014-05-11 DIAGNOSIS — E1149 Type 2 diabetes mellitus with other diabetic neurological complication: Secondary | ICD-10-CM

## 2014-05-11 LAB — LIPID PANEL
Cholesterol: 112 mg/dL (ref 0–200)
HDL: 35.7 mg/dL — AB (ref >39.00)
LDL CALC: 51 mg/dL (ref 0–99)
NonHDL: 76.3
Total CHOL/HDL Ratio: 3
Triglycerides: 128 mg/dL (ref 0.0–149.0)
VLDL: 25.6 mg/dL (ref 0.0–40.0)

## 2014-05-11 LAB — CBC WITH DIFFERENTIAL/PLATELET
BASOS PCT: 0.3 % (ref 0.0–3.0)
Basophils Absolute: 0 10*3/uL (ref 0.0–0.1)
EOS PCT: 4 % (ref 0.0–5.0)
Eosinophils Absolute: 0.3 10*3/uL (ref 0.0–0.7)
HEMATOCRIT: 46.7 % (ref 39.0–52.0)
Hemoglobin: 15.9 g/dL (ref 13.0–17.0)
LYMPHS ABS: 2.5 10*3/uL (ref 0.7–4.0)
Lymphocytes Relative: 32 % (ref 12.0–46.0)
MCHC: 34 g/dL (ref 30.0–36.0)
MCV: 89.1 fl (ref 78.0–100.0)
MONO ABS: 0.7 10*3/uL (ref 0.1–1.0)
Monocytes Relative: 9.4 % (ref 3.0–12.0)
Neutro Abs: 4.2 10*3/uL (ref 1.4–7.7)
Neutrophils Relative %: 54.3 % (ref 43.0–77.0)
Platelets: 184 10*3/uL (ref 150.0–400.0)
RBC: 5.24 Mil/uL (ref 4.22–5.81)
RDW: 13.4 % (ref 11.5–15.5)
WBC: 7.8 10*3/uL (ref 4.0–10.5)

## 2014-05-11 LAB — COMPREHENSIVE METABOLIC PANEL
ALT: 16 U/L (ref 0–53)
AST: 15 U/L (ref 0–37)
Albumin: 4.2 g/dL (ref 3.5–5.2)
Alkaline Phosphatase: 76 U/L (ref 39–117)
BUN: 20 mg/dL (ref 6–23)
CALCIUM: 9.3 mg/dL (ref 8.4–10.5)
CHLORIDE: 105 meq/L (ref 96–112)
CO2: 28 mEq/L (ref 19–32)
CREATININE: 0.85 mg/dL (ref 0.40–1.50)
GFR: 97.7 mL/min (ref >60.00)
GLUCOSE: 112 mg/dL — AB (ref 70–99)
POTASSIUM: 4.3 meq/L (ref 3.5–5.1)
Sodium: 139 mEq/L (ref 135–145)
Total Bilirubin: 0.9 mg/dL (ref 0.2–1.2)
Total Protein: 6.6 g/dL (ref 6.0–8.3)

## 2014-05-11 LAB — POCT URINALYSIS DIPSTICK
Bilirubin, UA: NEGATIVE
Blood, UA: NEGATIVE
KETONES UA: NEGATIVE
Leukocytes, UA: NEGATIVE
Nitrite, UA: NEGATIVE
PH UA: 5
Protein, UA: NEGATIVE
Spec Grav, UA: 1.02
Urobilinogen, UA: 0.2

## 2014-05-11 LAB — MICROALBUMIN / CREATININE URINE RATIO
Creatinine,U: 103 mg/dL
Microalb Creat Ratio: 0.7 mg/g (ref 0.0–30.0)
Microalb, Ur: 0.7 mg/dL (ref 0.0–1.9)

## 2014-05-11 LAB — PSA: PSA: 0.6 ng/mL (ref 0.10–4.00)

## 2014-05-11 LAB — TSH: TSH: 2.61 u[IU]/mL (ref 0.35–4.50)

## 2014-05-11 LAB — HEMOGLOBIN A1C: HEMOGLOBIN A1C: 6.1 % (ref 4.6–6.5)

## 2014-05-15 ENCOUNTER — Encounter: Payer: Self-pay | Admitting: Internal Medicine

## 2014-05-15 ENCOUNTER — Ambulatory Visit (INDEPENDENT_AMBULATORY_CARE_PROVIDER_SITE_OTHER): Payer: Managed Care, Other (non HMO) | Admitting: Internal Medicine

## 2014-05-15 VITALS — BP 108/68 | HR 78 | Temp 97.7°F | Resp 12 | Wt 222.8 lb

## 2014-05-15 DIAGNOSIS — E1149 Type 2 diabetes mellitus with other diabetic neurological complication: Secondary | ICD-10-CM | POA: Diagnosis not present

## 2014-05-15 DIAGNOSIS — IMO0002 Reserved for concepts with insufficient information to code with codable children: Secondary | ICD-10-CM

## 2014-05-15 DIAGNOSIS — E1165 Type 2 diabetes mellitus with hyperglycemia: Principal | ICD-10-CM

## 2014-05-15 MED ORDER — GLUCOSE BLOOD VI STRP: ORAL_STRIP | Status: DC

## 2014-05-15 MED ORDER — INSULIN DETEMIR 100 UNIT/ML FLEXPEN: PEN_INJECTOR | SUBCUTANEOUS | Status: DC

## 2014-05-15 NOTE — Patient Instructions (Addendum)
Patient Instructions  Please continue: - Metformin 1000 mg 2x a day with meals - Invokana 100 mg in am  Decrease Levemir to 12 units at bedtime. If sugars in am <130, you can decrease the dose further, to 10 units.  Please return in 3 months with your sugar log.

## 2014-05-15 NOTE — Progress Notes (Signed)
Patient ID: Clifford Alvarez, male   DOB: 02/19/1955, 59 y.o.   MRN: [id number]  HPI: Clifford EdmanRobert Steven Greenwood is a 59 y.o.-year-old male, returning for f/u for DM2 dx 01/28/2013, insulin-dependent since dx, uncontrolled, with complications (Peripheral neuropathy). Last visit was 3 mo ago.  Last hemoglobin A1c: Lab Results  Component Value Date   HGBA1C 6.1 05/11/2014   HGBA1C 6.3 02/11/2014   HGBA1C 6.1 11/10/2013   Pt is on a regimen of: - Levemir 20 >> 15 units qhs  - Metformin 1000 mg bid - Invokana 100 mg in am. No SEs. He injects insulin correctly.  Pt checks his sugars 3x a day - not in last 2 months as he was given urine, rather than blood glucose strips!!!!! - am: 202-252 >> 138-177 >> 102-158 >> 103-143 >> 91-130 >> 104-130 (up to 140 on 8 units of Levemir) - 2h after b'fast: n/c >> 146 >> 197 >> n/c - before lunch: n/c >> 135-158 (206) >> 102, 128 >> n/c - 2h after lunch: 117-136 >> 117, 143 >> 104-176 >> n/c >> 126 >> n/c - before dinner: n/c >> n/c - 2h after dinner: n/c - bedtime: 92-150s >> 104-176 >> 90-130, 172 >> 97, 101-154 No lows. Lowest sugar was 97,  has hypoglycemia awareness. Highest sugar was 154 >> 127  Meter: Bayer Contour  Pt's meals are: - Breakfast: bagel, oatmeal, granola, eggs - Lunch: meat + 2-3 vegetables; soup; salad; sandwich - Dinner: meat + 2-3 vegetables; pasta; [place]; seafood - Snacks: 1 a day >> nuts, popcorn, dark chocolate No regular exercise.  - no CKD, last BUN/creatinine:  Lab Results  Component Value Date   BUN 20 05/11/2014   CREATININE 0.85 05/11/2014   Microalb, Ur 05/11/2014 0.7  0.0 - 1.9 mg/dL Final  Creatinine,U 40/98/119105/09/2014 103.0   Final  Microalb Creat Ratio 05/11/2014 0.7  0.0 - 30.0 mg/g Final  He is on Losartan.  - He has HL. last set of lipids: Lab Results  Component Value Date   CHOL 112 05/11/2014   HDL 35.70* 05/11/2014   LDLCALC 51 05/11/2014   LDLDIRECT 161.0 01/28/2013   TRIG 128.0 05/11/2014   CHOLHDL 3 05/11/2014  He is on Lipitor. - last eye exam was in 12/2013. No DR. Has glaucoma. Occasional + blurry vision. Dr. Lottie DawsonBond. - no numbness and tingling in his feet. Foot exam 01/28/2013: neuropathy L  I reviewed pt's medications, allergies, PMH, social hx, family hx, and changes were documented in the history of present illness. Otherwise, unchanged from my initial visit note.  ROS: Constitutional: no weight loss, no fatigue, no subjective hyperthermia/hypothermia Eyes: no blurry vision, no xerophthalmia ENT: no sore throat, no nodules palpated in throat, no dysphagia/odynophagia, no hoarseness;  Cardiovascular: no CP/SOB/palpitations/leg swelling Respiratory: no cough/no SOB/wheezing Gastrointestinal: no N/V/D/C Musculoskeletal: no muscle/joint aches Skin: no rashes Neurological: no tremors/numbness/tingling/dizziness, no HA  PE: BP 108/68 mmHg  Pulse 78  Temp(Src) 97.7 F (36.5 C) (Oral)  Resp 12  Wt 222 lb 12.8 oz (101.061 kg)  SpO2 95% Body mass index is 26.41 kg/(m^2).  Wt Readings from Last 3 Encounters:  05/15/14 222 lb 12.8 oz (101.061 kg)  02/11/14 219 lb (99.338 kg)  11/17/13 223 lb (101.152 kg)   Constitutional: normal weight, in NAD Eyes: PERRLA, EOMI, no exophthalmos ENT: moist mucous membranes, no thyromegaly, no cervical lymphadenopathy Cardiovascular: RRR, No MRG Respiratory: CTA B Gastrointestinal: abdomen soft, NT, ND, BS+ Musculoskeletal: no deformities, strength intact in all 4 Skin: moist, warm,  no rashes  ASSESSMENT: 1. DM2, insulin-dependent, uncontrolled, with complications  - PN  PLAN:  1. Patient with relatively recent dx of diabetes, on basal insulin + maximal metformin dose + Invokana, with very good control, despite gradually reducing his insulin. At this visit, sugars stable, at goal. We will decrease the insulin to 12 units, then 10 units if sugars still at goal. - I suggested to  Patient Instructions  Please continue: -  Metformin 1000 mg 2x a day with meals - Invokana 100 mg in am  Decrease Levemir to 12 units at bedtime. If sugars in am <130, you can decrease the dose further, to 10 units.  Please return in 3 months with your sugar log.   - continue checking sugars at different times of the day - check 2-3 times a day, rotating checks  - he is up to date with yearly eye exams - reviewed recent HbA1c >> excellent - refilled strips - Return to clinic in 3 mo with sugar log

## 2014-05-18 ENCOUNTER — Encounter: Payer: Managed Care, Other (non HMO) | Admitting: Internal Medicine

## 2014-05-20 ENCOUNTER — Telehealth: Payer: Self-pay | Admitting: Internal Medicine

## 2014-05-20 MED ORDER — GLUCOSE BLOOD VI STRP: ORAL_STRIP | Status: DC

## 2014-05-20 MED ORDER — ONETOUCH VERIO W/DEVICE KIT: 1.0000 | PACK | Freq: Every day | Status: DC

## 2014-05-20 MED ORDER — ONETOUCH DELICA LANCETS FINE MISC: Status: DC

## 2014-05-20 NOTE — Telephone Encounter (Signed)
Patient is returning your call.  

## 2014-05-20 NOTE — Telephone Encounter (Signed)
Returned call to pt. Advised pt that his ins prefers the One Touch Verio brand for checking his blood sugar. Pt voiced understanding. New rx sent to pt's pharmacy.

## 2014-05-24 ENCOUNTER — Encounter: Payer: Self-pay | Admitting: Internal Medicine

## 2014-06-16 ENCOUNTER — Encounter: Payer: Self-pay | Admitting: Internal Medicine

## 2014-06-19 ENCOUNTER — Encounter: Payer: Managed Care, Other (non HMO) | Admitting: Adult Health

## 2014-06-19 ENCOUNTER — Encounter: Payer: Managed Care, Other (non HMO) | Admitting: Internal Medicine

## 2014-06-22 ENCOUNTER — Other Ambulatory Visit: Payer: Self-pay | Admitting: Internal Medicine

## 2014-06-24 ENCOUNTER — Ambulatory Visit (INDEPENDENT_AMBULATORY_CARE_PROVIDER_SITE_OTHER): Payer: Managed Care, Other (non HMO) | Admitting: Adult Health

## 2014-06-24 ENCOUNTER — Encounter: Payer: Self-pay | Admitting: Adult Health

## 2014-06-24 VITALS — BP 110/70 | Temp 98.5°F | Ht 76.0 in | Wt 222.4 lb

## 2014-06-24 DIAGNOSIS — G629 Polyneuropathy, unspecified: Secondary | ICD-10-CM | POA: Diagnosis not present

## 2014-06-24 DIAGNOSIS — Z23 Encounter for immunization: Secondary | ICD-10-CM

## 2014-06-24 DIAGNOSIS — IMO0002 Reserved for concepts with insufficient information to code with codable children: Secondary | ICD-10-CM

## 2014-06-24 DIAGNOSIS — E1165 Type 2 diabetes mellitus with hyperglycemia: Secondary | ICD-10-CM

## 2014-06-24 DIAGNOSIS — I10 Essential (primary) hypertension: Secondary | ICD-10-CM | POA: Diagnosis not present

## 2014-06-24 DIAGNOSIS — Z0000 Encounter for general adult medical examination without abnormal findings: Secondary | ICD-10-CM

## 2014-06-24 DIAGNOSIS — E1149 Type 2 diabetes mellitus with other diabetic neurological complication: Secondary | ICD-10-CM | POA: Diagnosis not present

## 2014-06-24 DIAGNOSIS — E785 Hyperlipidemia, unspecified: Secondary | ICD-10-CM

## 2014-06-24 NOTE — Addendum Note (Signed)
Addended by: Nancy Fetter on: 06/24/2014 08:51 AM   Modules accepted: Level of Service, SmartSet

## 2014-06-24 NOTE — Progress Notes (Signed)
Pre visit review using our clinic review tool, if applicable. No additional management support is needed unless otherwise documented below in the visit note. 

## 2014-06-24 NOTE — Progress Notes (Signed)
Subjective:    Patient ID: Clifford Alvarez, male    DOB: 1955-07-07, 59 y.o.   MRN: [id number]  HPI   He presents to the office for a complete physical. Clifford Alvarez has a  has a past medical history of Heart murmur; Glaucoma; and Diabetes mellitus without complication. He was diagnosed with DM2 about 15 months ago. He has no issues from his diabetes and it is well controlled. His most recent A1c is 6.1.    He has had no interval history and has no complaints.   Has had his yearly eye diabetic eye exam, and goes to the dentist twice a year  He is going to [place] and will need vaccinations for Typhoid and Hep A    He is due for a Colonoscopy this year and will schedule it .   Review of Systems  Constitutional: Negative.   HENT: Negative.   Eyes: Negative.   Respiratory: Negative.   Cardiovascular: Negative.   Gastrointestinal: Negative.   Endocrine: Negative.   Genitourinary: Negative.   Musculoskeletal: Negative.   Skin: Negative.   Neurological: Negative.   Hematological: Negative.   Psychiatric/Behavioral: Negative.   All other systems reviewed and are negative.  Past Medical History  Diagnosis Date  . Heart murmur     History of childhood heart murmur (Aortic)  . Glaucoma   . Diabetes mellitus without complication     History   Social History  . Marital Status: Married    Spouse Name: Jackelyn Poling  . Number of Children: 2  . Years of Education: N/A   Occupational History  . 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 Smoking status: Never Smoker   . Smokeless tobacco: Not on file  . Alcohol Use: No  . Drug Use: No  . Sexual Activity: Not on file   Other Topics Concern  . Not on file   Social History Narrative   Married 27 years   Wife - [name] 89   Daughter 37    Past Surgical History  Procedure Laterality Date  . Colonoscopy  2006    Dr. Cristina Gong    Family History  Problem Relation Age of Onset   . Diabetes Mother   . Diabetes Father   . Heart disease Father   . Diabetes Brother   . Diabetes Sister   . Hypertension Mother     Allergies  Allergen Reactions  . Penicillins     As a child    Current Outpatient Prescriptions on File Prior to Visit  Medication Sig Dispense Refill  . atorvastatin (LIPITOR) 20 MG tablet TAKE 1 TABLET (20 MG TOTAL) BY MOUTH DAILY. 90 tablet 1  . Blood Glucose Monitoring Suppl (ONETOUCH VERIO) W/DEVICE KIT 1 each by Does not apply route daily. 1 kit 0  . dorzolamide-timolol (COSOPT) 22.3-6.8 MG/ML ophthalmic solution Place 1 drop into both eyes 2 (two) times daily.    Marland Kitchen glucose blood (ONETOUCH VERIO) test strip Use to test blood sugar 3 times daily as instructed. 100 each 11  . Insulin Detemir (LEVEMIR FLEXTOUCH) 100 UNIT/ML Pen Inject 12 units every day at 10 pm 15 mL 5  . Insulin Pen Needle (BD PEN NEEDLE NANO U/F) 32G X 4 MM MISC 1 each by Does not apply route at bedtime. 50 each 5  . INVOKANA 100 MG TABS tablet TAKE 1 TABLET EVERY MORNING 30 tablet 3  . INVOKANA  100 MG TABS tablet TAKE 1 TABLET EVERY MORNING 30 tablet 3  . latanoprost (XALATAN) 0.005 % ophthalmic solution Place 1 drop into both eyes at bedtime.     Marland Kitchen losartan (COZAAR) 25 MG tablet TAKE 1 TABLET (25 MG TOTAL) BY MOUTH DAILY. 90 tablet 1  . metFORMIN (GLUCOPHAGE) 500 MG tablet TAKE 2 TABLETS (1,000 MG TOTAL) BY MOUTH 2 (TWO) TIMES DAILY WITH A MEAL. 180 tablet 1  . ONETOUCH DELICA LANCETS FINE MISC Use to test blood sugar 3 times daily as instructed. 100 each 11  . urine glucose reagent (DIASTIX) STRP 1 strip by Does not apply route 3 (three) times daily. 100 each 0   No current facility-administered medications on file prior to visit.    BP 110/70 mmHg  Temp(Src) 98.5 F (36.9 C) (Oral)  Ht 6' 4"  (1.93 m)  Wt 222 lb 6.4 oz (100.88 kg)  BMI 27.08 kg/m2       Objective:   Physical Exam  Nursing note reviewed.   General -- alert, well-developed, NAD.  Neck --no  thyromegaly , normal carotid pulse, no LAD  Lungs -- normal respiratory effort, no intercostal retractions, no accessory muscle use, and CTA B  Heart-- RRR, 3/6 murmur heard best over left sternal boarder Abdomen-- Not distended, good bowel sounds,soft, non-tender. No rebound or rigidity. No mass,organomegaly. Rectal-- Deferred by patient Prostate- Deferred by patient Extremities-- no pretibial edema bilaterally  Neurologic--  alert & oriented X3. Speech normal, gait appropriate for age, strength symmetric and appropriate for age.  DTRs symmetric. EOMI, PERLA   Psych-- Cognition and judgment appear intact. Cooperative with normal attention span and concentration. No anxious or depressed appearing.    Diabetic foot exam done.     Assessment & Plan:   1. Preventative health care - Follow up in one year for next CPE - Follow up in three months for A1c  - Continue to exercise and eat a healthy diabetic diet.    This is a routine wellness  examination for this patient . I reviewed all health maintenance protocols including  Needed referrals were placed. His immunization history was reviewed and appropriate vaccinations were ordered. His current medications and allergies were reviewed and needed refills of his chronic medications were ordered. The plan for yearly health maintenance was discussed all orders and referrals were made as appropriate.    2. Essential hypertension - Well controlled - no change  3. Diabetes mellitus with neurological manifestations, uncontrolled - Well controlled - no change in medicaiton Lab Results  Component Value Date   HGBA1C 6.1 05/11/2014    4. Peripheral neuropathy - Has tingling in his feet in the morning and then it goes away.   5. Hyperlipidemia - Is well controlled on Lipitor - no change

## 2014-06-24 NOTE — Addendum Note (Signed)
Addended by: Azucena Freed on: 06/24/2014 09:01 AM   Modules accepted: Orders

## 2014-06-24 NOTE — Patient Instructions (Addendum)
It was great meeting you today! Continue to eat healthy and exercise. Your labs look great!  You have received your first Hep A vaccination. You need to come back before your trip to have the second one. It is also recommended that you get your Thyphoid vaccination as well as Malaria medication.   Follow up in one year for your next physical and three months for your next physical exam.    Health Maintenance A healthy lifestyle and preventative care can promote health and wellness.  Maintain regular health, dental, and eye exams.  Eat a healthy diet. Foods like vegetables, fruits, whole grains, low-fat dairy products, and lean protein foods contain the nutrients you need and are low in calories. Decrease your intake of foods high in solid fats, added sugars, and salt. Get information about a proper diet from your health care provider, if necessary.  Regular physical exercise is one of the most important things you can do for your health. Most adults should get at least 150 minutes of moderate-intensity exercise (any activity that increases your heart rate and causes you to sweat) each week. In addition, most adults need muscle-strengthening exercises on 2 or more days a week.   Maintain a healthy weight. The body mass index (BMI) is a screening tool to identify possible weight problems. It provides an estimate of body fat based on height and weight. Your health care provider can find your BMI and can help you achieve or maintain a healthy weight. For males 20 years and older:  A BMI below 18.5 is considered underweight.  A BMI of 18.5 to 24.9 is normal.  A BMI of 25 to 29.9 is considered overweight.  A BMI of 30 and above is considered obese.  Maintain normal blood lipids and cholesterol by exercising and minimizing your intake of saturated fat. Eat a balanced diet with plenty of fruits and vegetables. Blood tests for lipids and cholesterol should begin at age 6 and be repeated every 5  years. If your lipid or cholesterol levels are high, you are over age 82, or you are at high risk for heart disease, you may need your cholesterol levels checked more frequently.Ongoing high lipid and cholesterol levels should be treated with medicines if diet and exercise are not working.  If you smoke, find out from your health care provider how to quit. If you do not use tobacco, do not start.  Lung cancer screening is recommended for adults aged 55-80 years who are at high risk for developing lung cancer because of a history of smoking. A yearly low-dose CT scan of the lungs is recommended for people who have at least a 30-pack-year history of smoking and are current smokers or have quit within the past 15 years. A pack year of smoking is smoking an average of 1 pack of cigarettes a day for 1 year (for example, a 30-pack-year history of smoking could mean smoking 1 pack a day for 30 years or 2 packs a day for 15 years). Yearly screening should continue until the smoker has stopped smoking for at least 15 years. Yearly screening should be stopped for people who develop a health problem that would prevent them from having lung cancer treatment.  If you choose to drink alcohol, do not have more than 2 drinks per day. One drink is considered to be 12 oz (360 mL) of beer, 5 oz (150 mL) of wine, or 1.5 oz (45 mL) of liquor.  Avoid the use of street  drugs. Do not share needles with anyone. Ask for help if you need support or instructions about stopping the use of drugs.  High blood pressure causes heart disease and increases the risk of stroke. Blood pressure should be checked at least every 1-2 years. Ongoing high blood pressure should be treated with medicines if weight loss and exercise are not effective.  If you are 4745-362 years old, ask your health care provider if you should take aspirin to prevent heart disease.  Diabetes screening involves taking a blood sample to check your fasting blood sugar  level. This should be done once every 3 years after age 60 if you are at a normal weight and without risk factors for diabetes. Testing should be considered at a younger age or be carried out more frequently if you are overweight and have at least 1 risk factor for diabetes.  Colorectal cancer can be detected and often prevented. Most routine colorectal cancer screening begins at the age of 60 and continues through age 60. However, your health care provider may recommend screening at an earlier age if you have risk factors for colon cancer. On a yearly basis, your health care provider may provide home test kits to check for hidden blood in the stool. A small camera at the end of a tube may be used to directly examine the colon (sigmoidoscopy or colonoscopy) to detect the earliest forms of colorectal cancer. Talk to your health care provider about this at age 60 when routine screening begins. A direct exam of the colon should be repeated every 5-10 years through age 60, unless early forms of precancerous polyps or small growths are found.  People who are at an increased risk for hepatitis B should be screened for this virus. You are considered at high risk for hepatitis B if:  You were born in a country where hepatitis B occurs often. Talk with your health care provider about which countries are considered high risk.  Your parents were born in a high-risk country and you have not received a shot to protect against hepatitis B (hepatitis B vaccine).  You have HIV or AIDS.  You use needles to inject street drugs.  You live with, or have sex with, someone who has hepatitis B.  You are a man who has sex with other men (MSM).  You get hemodialysis treatment.  You take certain medicines for conditions like cancer, organ transplantation, and autoimmune conditions.  Hepatitis C blood testing is recommended for all people born from [id number] through 1966 and any individual with known risk factors for  hepatitis C.  Healthy men should no longer receive prostate-specific antigen (PSA) blood tests as part of routine cancer screening. Talk to your health care provider about prostate cancer screening.  Testicular cancer screening is not recommended for adolescents or adult males who have no symptoms. Screening includes self-exam, a health care provider exam, and other screening tests. Consult with your health care provider about any symptoms you have or any concerns you have about testicular cancer.  Practice safe sex. Use condoms and avoid high-risk sexual practices to reduce the spread of sexually transmitted infections (STIs).  You should be screened for STIs, including gonorrhea and chlamydia if:  You are sexually active and are younger than 24 years.  You are older than 24 years, and your health care provider tells you that you are at risk for this type of infection.  Your sexual activity has changed since you were last screened, and you  are at an increased risk for chlamydia or gonorrhea. Ask your health care provider if you are at risk.  If you are at risk of being infected with HIV, it is recommended that you take a prescription medicine daily to prevent HIV infection. This is called pre-exposure prophylaxis (PrEP). You are considered at risk if:  You are a man who has sex with other men (MSM).  You are a heterosexual man who is sexually active with multiple partners.  You take drugs by injection.  You are sexually active with a partner who has HIV.  Talk with your health care provider about whether you are at high risk of being infected with HIV. If you choose to begin PrEP, you should first be tested for HIV. You should then be tested every 3 months for as long as you are taking PrEP.  Use sunscreen. Apply sunscreen liberally and repeatedly throughout the day. You should seek shade when your shadow is shorter than you. Protect yourself by wearing long sleeves, pants, a  wide-brimmed hat, and sunglasses year round whenever you are outdoors.  Tell your health care provider of new moles or changes in moles, especially if there is a change in shape or color. Also, tell your health care provider if a mole is larger than the size of a pencil eraser.  A one-time screening for abdominal aortic aneurysm (AAA) and surgical repair of large AAAs by ultrasound is recommended for men aged 65-75 years who are current or former smokers.  Stay current with your vaccines (immunizations). Document Released: 06/17/2007 Document Revised: 12/24/2012 Document Reviewed: 05/16/2010 [facility] Patient Information 2015 [place], [place]. This information is not intended to replace advice given to you by your health care provider. Make sure you discuss any questions you have with your health care provider.

## 2014-06-30 ENCOUNTER — Encounter: Payer: Self-pay | Admitting: *Deleted

## 2014-06-30 ENCOUNTER — Other Ambulatory Visit: Payer: Self-pay | Admitting: *Deleted

## 2014-06-30 MED ORDER — ONETOUCH DELICA LANCETS FINE MISC: Status: DC

## 2014-06-30 MED ORDER — GLUCOSE BLOOD VI STRP: ORAL_STRIP | Status: DC

## 2014-06-30 MED ORDER — ONETOUCH VERIO W/DEVICE KIT: 1.0000 | PACK | Freq: Every day | Status: DC

## 2014-06-30 NOTE — Telephone Encounter (Signed)
Pt's ins co would not approve the Contour strips and lancets. Sending in a new rx for the [employer] Ver[employer], again (per pt's MyChart message). Be advised.

## 2014-07-20 ENCOUNTER — Other Ambulatory Visit: Payer: Self-pay | Admitting: Internal Medicine

## 2014-08-12 DIAGNOSIS — H2513 Age-related nuclear cataract, bilateral: Secondary | ICD-10-CM | POA: Insufficient documentation

## 2014-08-12 LAB — HM DIABETES EYE EXAM

## 2014-08-17 ENCOUNTER — Ambulatory Visit (INDEPENDENT_AMBULATORY_CARE_PROVIDER_SITE_OTHER): Payer: Managed Care, Other (non HMO) | Admitting: Internal Medicine

## 2014-08-17 ENCOUNTER — Other Ambulatory Visit (INDEPENDENT_AMBULATORY_CARE_PROVIDER_SITE_OTHER): Payer: Managed Care, Other (non HMO) | Admitting: *Deleted

## 2014-08-17 ENCOUNTER — Encounter: Payer: Self-pay | Admitting: Internal Medicine

## 2014-08-17 VITALS — BP 112/62 | HR 71 | Temp 98.1°F | Resp 12 | Wt 223.0 lb

## 2014-08-17 DIAGNOSIS — E1165 Type 2 diabetes mellitus with hyperglycemia: Principal | ICD-10-CM

## 2014-08-17 DIAGNOSIS — E1149 Type 2 diabetes mellitus with other diabetic neurological complication: Secondary | ICD-10-CM

## 2014-08-17 DIAGNOSIS — IMO0002 Reserved for concepts with insufficient information to code with codable children: Secondary | ICD-10-CM

## 2014-08-17 LAB — POCT GLYCOSYLATED HEMOGLOBIN (HGB A1C): Hemoglobin A1C: 5.9

## 2014-08-17 NOTE — Progress Notes (Signed)
Patient ID: Clifford Alvarez, male   DOB: [birth year], 59 y.o.   MRN: [id number]  HPI: Clifford Alvarez is a 59 y.o.-year-old male, returning for f/u for DM2 dx 01/28/2013, insulin-dependent since dx, uncontrolled, with complications (Peripheral neuropathy). Last visit was 3 mo ago.  Last hemoglobin A1c: Lab Results  Component Value Date   HGBA1C 6.1 05/11/2014   HGBA1C 6.3 02/11/2014   HGBA1C 6.1 11/10/2013   Pt is on a regimen of: - Levemir 20 >> 15 >> 12 units qhs  - Metformin 1000 mg bid - Invokana 100 mg in am. No SEs. He injects insulin correctly.  Pt checks his sugars 3x a day - not in last 2 months as he was given urine, rather than blood glucose strips!!!!! - am: 202-252 >> 138-177 >> 102-158 >> 103-143 >> 91-130 >> 104-130 >> 123-150, 163 - 2h after b'fast: n/c >> 146 >> 197 >> n/c - before lunch: n/c >> 135-158 (206) >> 102, 128 >> n/c - 2h after lunch: 117-136 >> 117, 143 >> 104-176 >> n/c >> 126 >> n/c - before dinner: n/c >> n/c - 2h after dinner: n/c >> 108-173 - bedtime: 92-150s >> 104-176 >> 90-130, 172 >> 97, 101-154 >> n/c - nighttime: 119-139 No lows. Lowest sugar was 97 >> 108,  has hypoglycemia awareness. Highest sugar was 154 >> 127 >> 190  Meter: Bayer Contour >> One Touch verio  Pt's meals are: - Breakfast: bagel, oatmeal, granola, eggs - Lunch: meat + 2-3 vegetables; soup; salad; sandwich - Dinner: meat + 2-3 vegetables; pasta; [place]; seafood - Snacks: 1 a day >> nuts, popcorn, dark chocolate No regular exercise.  - no CKD, last BUN/creatinine:  Lab Results  Component Value Date   BUN 20 05/11/2014   CREATININE 0.85 05/11/2014   Microalb, Ur 05/11/2014 0.7  0.0 - 1.9 mg/dL Final  Creatinine,U 40/98/1191 103.0   Final  Microalb Creat Ratio 05/11/2014 0.7  0.0 - 30.0 mg/g Final  He is on Losartan.  - He has HL. last set of lipids: Lab Results  Component Value Date   CHOL 112 05/11/2014   HDL 35.70* 05/11/2014   LDLCALC 51 05/11/2014    LDLDIRECT 161.0 01/28/2013   TRIG 128.0 05/11/2014   CHOLHDL 3 05/11/2014  He is on Lipitor. - last eye exam was in 12/2013. No DR. Has glaucoma. Occasional + blurry vision. Dr. Lottie Dawson. - no numbness and tingling in his feet. Foot exam 01/28/2013: neuropathy L  I reviewed pt's medications, allergies, PMH, social hx, family hx, and changes were documented in the history of present illness. Otherwise, unchanged from my initial visit note.  He is going to [place] next week.  ROS: Constitutional: no weight loss, no fatigue, no subjective hyperthermia/hypothermia Eyes: no blurry vision, no xerophthalmia ENT: no sore throat, no nodules palpated in throat, no dysphagia/odynophagia, no hoarseness;  Cardiovascular: no CP/SOB/palpitations/leg swelling Respiratory: no cough/no SOB/wheezing Gastrointestinal: no N/V/D/C Musculoskeletal: no muscle/joint aches Skin: no rashes Neurological: no tremors/numbness/tingling/dizziness, no HA  PE: BP 112/62 mmHg  Pulse 71  Temp(Src) 98.1 F (36.7 C) (Oral)  Resp 12  Wt 223 lb (101.152 kg)  SpO2 95% Body mass index is 27.16 kg/(m^2).  Wt Readings from Last 3 Encounters:  08/17/14 223 lb (101.152 kg)  06/24/14 222 lb 6.4 oz (100.88 kg)  05/15/14 222 lb 12.8 oz (101.061 kg)   Constitutional: normal weight, in NAD Eyes: PERRLA, EOMI, no exophthalmos ENT: moist mucous membranes, no thyromegaly, no cervical lymphadenopathy Cardiovascular: RRR, No  MRG Respiratory: CTA B Gastrointestinal: abdomen soft, NT, ND, BS+ Musculoskeletal: no deformities, strength intact in all 4 Skin: moist, warm, no rashes  ASSESSMENT: 1. DM2, insulin-dependent, uncontrolled, with complications  - PN  PLAN:  1. Patient with relatively recent dx of diabetes, on basal insulin + maximal metformin dose + Invokana, with very good control, despite gradually reducing his insulin. At this visit, sugars are a little higher in am as he eats dinner late. We will continue current  insulin dose. If sugars better at next visit, may decrease it further. - I suggested to  Patient Instructions  Please continue: - Metformin 1000 mg 2x a day with meals - Invokana 100 mg in am - Levemir to 12 units at bedtime  Please return in 3 months with your sugar log.   - continue checking sugars at different times of the day - check 2 times a day, rotating checks  - he is up to date with yearly eye exams - new HbA1c >> 5.9%! (excellent) - Return to clinic in 3 mo with sugar log

## 2014-08-17 NOTE — Patient Instructions (Signed)
Please continue: - Metformin 1000 mg 2x a day with meals - Invokana 100 mg in am - Levemir to 12 units at bedtime  Please return in 3 months with your sugar log.

## 2014-09-23 ENCOUNTER — Ambulatory Visit: Payer: Managed Care, Other (non HMO) | Admitting: Internal Medicine

## 2014-09-24 ENCOUNTER — Ambulatory Visit: Payer: Managed Care, Other (non HMO) | Admitting: Adult Health

## 2014-09-28 ENCOUNTER — Ambulatory Visit (INDEPENDENT_AMBULATORY_CARE_PROVIDER_SITE_OTHER): Payer: Managed Care, Other (non HMO) | Admitting: Adult Health

## 2014-09-28 ENCOUNTER — Encounter: Payer: Self-pay | Admitting: Adult Health

## 2014-09-28 VITALS — BP 130/78 | HR 68 | Temp 98.9°F | Ht 77.0 in | Wt 224.9 lb

## 2014-09-28 DIAGNOSIS — Z7189 Other specified counseling: Secondary | ICD-10-CM | POA: Diagnosis not present

## 2014-09-28 DIAGNOSIS — Z23 Encounter for immunization: Secondary | ICD-10-CM | POA: Diagnosis not present

## 2014-09-28 DIAGNOSIS — Z7184 Encounter for health counseling related to travel: Secondary | ICD-10-CM

## 2014-09-28 MED ORDER — ATOVAQUONE-PROGUANIL HCL 250-100 MG PO TABS: 1.0000 | ORAL_TABLET | Freq: Every day | ORAL | Status: DC

## 2014-09-28 MED ORDER — ONDANSETRON HCL 4 MG PO TABS: 4.0000 mg | ORAL_TABLET | Freq: Three times a day (TID) | ORAL | Status: DC | PRN

## 2014-09-28 MED ORDER — CIPROFLOXACIN HCL 500 MG PO TABS: 500.0000 mg | ORAL_TABLET | Freq: Two times a day (BID) | ORAL | Status: DC

## 2014-09-28 MED ORDER — TYPHOID VACCINE PO CPDR: 1.0000 | DELAYED_RELEASE_CAPSULE | ORAL | Status: DC

## 2014-09-28 NOTE — Progress Notes (Signed)
Pre visit review using our clinic review tool, if applicable. No additional management support is needed unless otherwise documented below in the visit note. 

## 2014-09-28 NOTE — Progress Notes (Signed)
Subjective:    Patient ID: Clifford Alvarez, male    DOB: 1955-04-25, 59 y.o.   MRN: [id number]  HPI  59 year old male who presents to the office today for follow up regarding his vaccinations for his upcoming trip to [place].   He voices no other concerns.   Review of Systems  Constitutional: Negative.   HENT: Negative.   Eyes: Negative.   Respiratory: Negative.   Cardiovascular: Negative.   Gastrointestinal: Negative.   Endocrine: Negative.   Genitourinary: Negative.   Musculoskeletal: Negative.   Skin: Negative.   Allergic/Immunologic: Negative.   Neurological: Negative.   Hematological: Negative.   Psychiatric/Behavioral: Negative.   All other systems reviewed and are negative.  Past Medical History  Diagnosis Date  . Heart murmur     History of childhood heart murmur (Aortic)  . Glaucoma   . Diabetes mellitus without complication     Social History   Social History  . Marital Status: Married    Spouse Name: Jackelyn Poling  . Number of Children: 2  . Years of Education: N/A   Occupational History  . 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 Smoking status: Never Smoker   . Smokeless tobacco: Not on file  . Alcohol Use: No  . Drug Use: No  . Sexual Activity: Not on file   Other Topics Concern  . Not on file   Social History Narrative   Married 31 years   Wife - [name] 3   Daughter 43    Past Surgical History  Procedure Laterality Date  . Colonoscopy  2006    Dr. Cristina Gong    Family History  Problem Relation Age of Onset  . Diabetes Mother   . Diabetes Father   . Heart disease Father   . Diabetes Brother   . Diabetes Sister   . Hypertension Mother     Allergies  Allergen Reactions  . Penicillins     As a child    Current Outpatient Prescriptions on File Prior to Visit  Medication Sig Dispense Refill  . atorvastatin (LIPITOR) 20 MG tablet TAKE 1 TABLET (20 MG TOTAL) BY MOUTH DAILY. 90  tablet 1  . Blood Glucose Monitoring Suppl (ONETOUCH VERIO) W/DEVICE KIT 1 each by Does not apply route daily. 1 kit 0  . dorzolamide-timolol (COSOPT) 22.3-6.8 MG/ML ophthalmic solution Place 1 drop into both eyes 2 (two) times daily.    Marland Kitchen glucose blood (ONETOUCH VERIO) test strip Use to test blood sugar 3 times daily as instructed. 100 each 11  . Insulin Detemir (LEVEMIR FLEXTOUCH) 100 UNIT/ML Pen Inject 12 units every day at 10 pm 15 mL 5  . Insulin Pen Needle (BD PEN NEEDLE NANO U/F) 32G X 4 MM MISC 1 each by Does not apply route at bedtime. 50 each 5  . INVOKANA 100 MG TABS tablet TAKE 1 TABLET EVERY MORNING 30 tablet 3  . INVOKANA 100 MG TABS tablet TAKE 1 TABLET EVERY MORNING 30 tablet 3  . latanoprost (XALATAN) 0.005 % ophthalmic solution Place 1 drop into both eyes at bedtime.     Marland Kitchen losartan (COZAAR) 25 MG tablet TAKE 1 TABLET (25 MG TOTAL) BY MOUTH DAILY. 90 tablet 1  . metFORMIN (GLUCOPHAGE) 500 MG tablet TAKE 2 TABLETS (1,000 MG TOTAL) BY MOUTH 2 (TWO) TIMES DAILY WITH A MEAL. 180 tablet 1  . ONETOUCH DELICA LANCETS FINE MISC Use to  test blood sugar 3 times daily as instructed. 100 each 11  . urine glucose reagent (DIASTIX) STRP 1 strip by Does not apply route 3 (three) times daily. 100 each 0   No current facility-administered medications on file prior to visit.    BP 130/78 mmHg  Pulse 68  Temp(Src) 98.9 F (37.2 C) (Oral)  Ht 6' 5"  (1.956 m)  Wt 224 lb 14.4 oz (102.014 kg)  BMI 26.66 kg/m2  SpO2 96%       Objective:   Physical Exam  Constitutional: He is oriented to person, place, and time. He appears well-developed and well-nourished. No distress.  Cardiovascular: Normal rate, regular rhythm, normal heart sounds and intact distal pulses.  Exam reveals no friction rub.   No murmur heard. Pulmonary/Chest: Breath sounds normal. No respiratory distress. He has no wheezes. He has no rales. He exhibits no tenderness.  Musculoskeletal: Normal range of motion. He exhibits no  edema or tenderness.  Neurological: He is alert and oriented to person, place, and time.  Skin: Skin is warm and dry. No rash noted. No erythema. No pallor.  Psychiatric: He has a normal mood and affect. His behavior is normal. Judgment and thought content normal.  Nursing note and vitals reviewed.     Assessment & Plan:  1. Travel advice encounter - Consulted CDC web site on vaccinations for area where patient will be traveling in [place] - atovaquone-proguanil (MALARONE) 250-100 MG TABS; Take 1 tablet by mouth daily. Take 2 days before travel. During travel and 7 days after returning  Dispense: 27 tablet; Refill: 0 - ciprofloxacin (CIPRO) 500 MG tablet; Take 1 tablet (500 mg total) by mouth 2 (two) times daily. Take if needed for diarrhea  Dispense: 6 tablet; Refill: 0 - ondansetron (ZOFRAN) 4 MG tablet; Take 1 tablet (4 mg total) by mouth every 8 (eight) hours as needed for nausea or vomiting.  Dispense: 20 tablet; Refill: 0 - typhoid (VIVOTIF) DR capsule; Take 1 capsule by mouth every other day.  Dispense: 4 capsule; Refill: 0 - He can also go to the [facility] if the Typhoid oral is too expensive.    2. Encounter for immunization - High dose flu given

## 2014-09-28 NOTE — Patient Instructions (Signed)
It was great seeing you again and I hope you have a great trip.   Take the cipro as needed for diarrhea  Take the Malarone two days before your trip, every day during the trip and for seven days after the trip.   Zofran for nausea if needed

## 2014-10-08 ENCOUNTER — Other Ambulatory Visit: Payer: Self-pay | Admitting: Internal Medicine

## 2014-11-05 ENCOUNTER — Other Ambulatory Visit: Payer: Self-pay | Admitting: Internal Medicine

## 2014-11-25 ENCOUNTER — Ambulatory Visit: Payer: Managed Care, Other (non HMO) | Admitting: Internal Medicine

## 2014-12-15 ENCOUNTER — Ambulatory Visit (INDEPENDENT_AMBULATORY_CARE_PROVIDER_SITE_OTHER): Payer: Managed Care, Other (non HMO) | Admitting: Internal Medicine

## 2014-12-15 ENCOUNTER — Encounter: Payer: Self-pay | Admitting: Internal Medicine

## 2014-12-15 ENCOUNTER — Other Ambulatory Visit (INDEPENDENT_AMBULATORY_CARE_PROVIDER_SITE_OTHER): Payer: Managed Care, Other (non HMO) | Admitting: *Deleted

## 2014-12-15 VITALS — BP 118/78 | HR 67 | Temp 98.1°F | Resp 12 | Wt 220.8 lb

## 2014-12-15 DIAGNOSIS — IMO0002 Reserved for concepts with insufficient information to code with codable children: Secondary | ICD-10-CM

## 2014-12-15 DIAGNOSIS — E1149 Type 2 diabetes mellitus with other diabetic neurological complication: Secondary | ICD-10-CM

## 2014-12-15 DIAGNOSIS — E1165 Type 2 diabetes mellitus with hyperglycemia: Secondary | ICD-10-CM

## 2014-12-15 LAB — POCT GLYCOSYLATED HEMOGLOBIN (HGB A1C): HEMOGLOBIN A1C: 5.8

## 2014-12-15 NOTE — Patient Instructions (Signed)
Please continue: - Levemir 12 units at bedtime - Metformin 1000 mg 2x a day with meals - Invokana 100 mg in am  Please return in 4 months with your sugar log.

## 2014-12-15 NOTE — Progress Notes (Signed)
Patient ID: Clifford Alvarez, male   DOB: 07/27/1955, 59 y.o.   MRN: [id number]  HPI: Clifford EdmanRobert Steven Alvarez is a 59 y.o.-year-old male, returning for f/u for DM2 dx 01/28/2013, insulin-dependent since dx, uncontrolled, with complications (Peripheral neuropathy). Last visit was 3 mo ago.  Last hemoglobin A1c: Lab Results  Component Value Date   HGBA1C 5.9 08/17/2014   HGBA1C 6.1 05/11/2014   HGBA1C 6.3 02/11/2014   Pt is on a regimen of: - Levemir 20 >> 15 >> 12 units qhs  - Metformin 1000 mg bid - Invokana 100 mg in am. No SEs. He injects insulin correctly.  Pt checks his sugars 3x a day: - am: 202-252 >> 138-177 >> 102-158 >> 103-143 >> 91-130 >> 104-130 >> 123-150, 163 >> 101-141, 152 - 2h after b'fast: n/c >> 146 >> 197 >> n/c  - before lunch: n/c >> 135-158 (206) >> 102, 128 >> n/c - 2h after lunch: 117-136 >> 117, 143 >> 104-176 >> n/c >> 126 >> n/c - before dinner: n/c >> n/c - 2h after dinner: n/c >> 108-173 - bedtime: 92-150s >> 104-176 >> 90-130, 172 >> 97, 101-154 >> n/c >> 120-174 (higher while in [place] - nighttime: 119-139 >> 123-145, 170 No lows. Lowest sugar was 97 >> 108 >> 101,  has hypoglycemia awareness. Highest sugar was 154 >> 127 >> 190 >> 199.  Meter: [employer] Cont[employer] >> One Touch verio  Pt's meals are: - Breakfast: bagel, oatmeal, granola, eggs - Lunch: meat + 2-3 vegetables; soup; salad; sandwich - Dinner: meat + 2-3 vegetables; pasta; [place]; seafood - Snacks: 1 a day >> nuts, popcorn, dark chocolate No regular exercise.  - no CKD, last BUN/creatinine:  Lab Results  Component Value Date   BUN 20 05/11/2014   CREATININE 0.85 05/11/2014   Microalb, Ur 05/11/2014 0.7  0.0 - 1.9 mg/dL Final  Creatinine,U 78/29/562105/09/2014 103.0   Final  Microalb Creat Ratio 05/11/2014 0.7  0.0 - 30.0 mg/g Final  He is on Losartan.  - He has HL. last set of lipids: Lab Results  Component Value Date   CHOL 112 05/11/2014   HDL 35.70* 05/11/2014   LDLCALC 51  05/11/2014   LDLDIRECT 161.0 01/28/2013   TRIG 128.0 05/11/2014   CHOLHDL 3 05/11/2014  He is on Lipitor. - last eye exam was in 12/2013. No DR. Has glaucoma. Occasional + blurry vision. Dr. Lottie DawsonBond. - no numbness and tingling in his feet. Foot exam 01/28/2013: neuropathy L  I reviewed pt's medications, allergies, PMH, social hx, family hx, and changes were documented in the history of present illness. Otherwise, unchanged from my initial visit note.  [place] went [place] recently.  ROS: Constitutional: no weight loss, no fatigue, no subjective hyperthermia/hypothermia Eyes: no blurry vision, no xerophthalmia ENT: no sore throat, no nodules palpated in throat, no dysphagia/odynophagia, no hoarseness;  Cardiovascular: no CP/SOB/palpitations/leg swelling Respiratory: no cough/no SOB/wheezing Gastrointestinal: no N/V/D/C Musculoskeletal: no muscle/joint aches Skin: no rashes Neurological: no tremors/numbness/tingling/dizziness, no HA  PE: BP 118/78 mmHg  Pulse 67  Temp(Src) 98.1 F (36.7 C) (Oral)  Resp 12  Wt 220 lb 12.8 oz (100.154 kg)  SpO2 96% Body mass index is 26.18 kg/(m^2).  Wt Readings from Last 3 Encounters:  12/15/14 220 lb 12.8 oz (100.154 kg)  09/28/14 224 lb 14.4 oz (102.014 kg)  08/17/14 223 lb (101.152 kg)   Constitutional: normal weight, in NAD Eyes: PERRLA, EOMI, no exophthalmos ENT: moist mucous membranes, no thyromegaly, no cervical lymphadenopathy Cardiovascular: RRR,  No MR, but has a S1 Gallop Respiratory: CTA B Gastrointestinal: abdomen soft, NT, ND, BS+ Musculoskeletal: no deformities, strength intact in all 4 Skin: moist, warm, no rashes  ASSESSMENT: 1. DM2, insulin-dependent, uncontrolled, with complications  - PN  PLAN:  1. Patient with relatively recent dx of diabetes, on basal insulin + maximal metformin dose + Invokana, with very good control. Sugars are a little higher after dinner and in am as he eats dinner late. We will continue current  regimen.  - I suggested to  Patient Instructions  Please continue: - Metformin 1000 mg 2x a day with meals - Invokana 100 mg in am - Levemir 12 units at bedtime  Please return in 4 months with your sugar log.   - continue checking sugars at different times of the day - check 2 times a day, rotating checks  - he is up to date with yearly eye exams - new HbA1c >> 5.8%! (excellent) - Return to clinic in 4 mo with sugar log

## 2015-02-26 ENCOUNTER — Other Ambulatory Visit: Payer: Self-pay | Admitting: Internal Medicine

## 2015-03-14 ENCOUNTER — Other Ambulatory Visit: Payer: Self-pay | Admitting: Family Medicine

## 2015-03-27 ENCOUNTER — Other Ambulatory Visit: Payer: Self-pay | Admitting: Internal Medicine

## 2015-04-13 ENCOUNTER — Ambulatory Visit (INDEPENDENT_AMBULATORY_CARE_PROVIDER_SITE_OTHER): Payer: Managed Care, Other (non HMO) | Admitting: Internal Medicine

## 2015-04-13 ENCOUNTER — Encounter: Payer: Self-pay | Admitting: Internal Medicine

## 2015-04-13 ENCOUNTER — Other Ambulatory Visit (INDEPENDENT_AMBULATORY_CARE_PROVIDER_SITE_OTHER): Payer: Managed Care, Other (non HMO) | Admitting: *Deleted

## 2015-04-13 VITALS — BP 108/70 | HR 72 | Temp 97.7°F | Resp 12 | Wt 222.0 lb

## 2015-04-13 DIAGNOSIS — E1165 Type 2 diabetes mellitus with hyperglycemia: Secondary | ICD-10-CM | POA: Diagnosis not present

## 2015-04-13 DIAGNOSIS — E1149 Type 2 diabetes mellitus with other diabetic neurological complication: Secondary | ICD-10-CM | POA: Diagnosis not present

## 2015-04-13 DIAGNOSIS — IMO0002 Reserved for concepts with insufficient information to code with codable children: Secondary | ICD-10-CM

## 2015-04-13 LAB — COMPLETE METABOLIC PANEL WITH GFR
ALBUMIN: 4.5 g/dL (ref 3.6–5.1)
ALK PHOS: 69 U/L (ref 40–115)
ALT: 16 U/L (ref 9–46)
AST: 18 U/L (ref 10–35)
BUN: 21 mg/dL (ref 7–25)
CALCIUM: 9.1 mg/dL (ref 8.6–10.3)
CHLORIDE: 105 mmol/L (ref 98–110)
CO2: 25 mmol/L (ref 20–31)
Creat: 0.86 mg/dL (ref 0.70–1.25)
GFR, Est Non African American: >89 mL/min (ref >=60)
Glucose, Bld: 109 mg/dL — ABNORMAL HIGH (ref 65–99)
POTASSIUM: 4.9 mmol/L (ref 3.5–5.3)
Sodium: 139 mmol/L (ref 135–146)
Total Bilirubin: 0.8 mg/dL (ref 0.2–1.2)
Total Protein: 6.8 g/dL (ref 6.1–8.1)

## 2015-04-13 LAB — MICROALBUMIN / CREATININE URINE RATIO
Creatinine,U: 112 mg/dL
Microalb Creat Ratio: 0.6 mg/g (ref 0.0–30.0)
Microalb, Ur: <0.7 mg/dL (ref 0.0–1.9)

## 2015-04-13 LAB — LIPID PANEL
CHOL/HDL RATIO: 4
CHOLESTEROL: 139 mg/dL (ref 0–200)
HDL: 37.4 mg/dL — ABNORMAL LOW (ref >39.00)
NonHDL: 101.69
TRIGLYCERIDES: 248 mg/dL — AB (ref 0.0–149.0)
VLDL: 49.6 mg/dL — AB (ref 0.0–40.0)

## 2015-04-13 LAB — POCT GLYCOSYLATED HEMOGLOBIN (HGB A1C): Hemoglobin A1C: 5.9

## 2015-04-13 LAB — LDL CHOLESTEROL, DIRECT: Direct LDL: 70 mg/dL

## 2015-04-13 NOTE — Patient Instructions (Addendum)
Please continue: - Metformin 1000 mg 2x a day with meals - Invokana 100 mg in am - Levemir 12 units at bedtime  Please return in 4 months with your sugar log.   Please stop at the lab.  Please check with your insurance whether the following drugs are covered:  Invokana Farxiga Jardiance  Levemir Lantus Nicola Policeoujeo  Basaglar  Evaristo Buryresiba

## 2015-04-13 NOTE — Progress Notes (Signed)
Patient ID: Clifford Alvarez, male   DOB: [birth year], 60 y.o.   MRN: [id number]  HPI: Clifford Alvarez is a 60 y.o.-year-old male, returning for f/u for DM2 dx 01/28/2013, insulin-dependent since dx, uncontrolled, with complications (Peripheral neuropathy). Last visit was 4 mo ago.  Last hemoglobin A1c: Lab Results  Component Value Date   HGBA1C 5.8 12/15/2014   HGBA1C 5.9 08/17/2014   HGBA1C 6.1 05/11/2014   Pt is on a regimen of: - Levemir 20 >> 15 >> 12 units qhs  - Metformin 1000 mg bid - Invokana 100 mg in am. No SEs. He injects insulin correctly.  Pt checks his sugars 2x a day: - am: 202-252 >> 138-177 >> 102-158 >> 103-143 >> 91-130 >> 104-130 >> 123-150, 163 >> 101-141, 152 >> 110-130 - 2h after b'fast: n/c >> 146 >> 197 >> n/c  - before lunch: n/c >> 135-158 (206) >> 102, 128 >> n/c - 2h after lunch: 117-136 >> 117, 143 >> 104-176 >> n/c >> 126 >> n/c - before dinner: n/c >> n/c - 2h after dinner: n/c >> 108-173 >> 125-145, 170 - bedtime: 92-150s >> 104-176 >> 90-130, 172 >> 97, 101-154 >> n/c >> 120-174 (higher while in [place]) >> n/c - nighttime: 119-139 >> 123-145, 170 No lows. Lowest sugar was 97 >> 108 >> 101 >> 94,  has hypoglycemia awareness. Highest sugar was 154 >> 127 >> 190 >> 199 >> 170x1.  Meter: [employer] >> One Touch verio  Pt's meals are: - Breakfast: bagel, oatmeal, granola, eggs - Lunch: meat + 2-3 vegetables; soup; salad; sandwich - Dinner: meat + 2-3 vegetables; pasta; [place]; seafood - Snacks: 1 a day >> nuts, popcorn, dark chocolate No regular exercise.  - no CKD, last BUN/creatinine:  Lab Results  Component Value Date   BUN 20 05/11/2014   CREATININE 0.85 05/11/2014   Microalb, Ur 05/11/2014 0.7  0.0 - 1.9 mg/dL Final  Creatinine,U 05/11/2014 103.0   Final  Microalb Creat Ratio 05/11/2014 0.7  0.0 - 30.0 mg/g Final  He is on Losartan.  - He has HL. last set of lipids: Lab Results  Component Value Date   CHOL 112 05/11/2014    HDL 35.70* 05/11/2014   LDLCALC 51 05/11/2014   LDLDIRECT 161.0 01/28/2013   TRIG 128.0 05/11/2014   CHOLHDL 3 05/11/2014  He is on Lipitor. - last eye exam was in 12/2013. No DR. Has glaucoma. Occasional + blurry vision. Dr. Edilia Bo. - no numbness and tingling in his feet. Foot exam 01/28/2013: neuropathy L  I reviewed pt's medications, allergies, PMH, social hx, family hx, and changes were documented in the history of present illness. Otherwise, unchanged from my initial visit note.  ROS: Constitutional: no weight loss, no fatigue, no subjective hyperthermia/hypothermia Eyes: no blurry vision, no xerophthalmia ENT: no sore throat, no nodules palpated in throat, no dysphagia/odynophagia, no hoarseness;  Cardiovascular: no CP/SOB/palpitations/leg swelling Respiratory: no cough/no SOB/wheezing Gastrointestinal: no N/V/D/C Musculoskeletal: no muscle/joint aches Skin: no rashes Neurological: no tremors/numbness/tingling/dizziness, no HA  PE: BP 108/70 mmHg  Pulse 72  Temp(Src) 97.7 F (36.5 C) (Oral)  Resp 12  Wt 222 lb (100.699 kg)  SpO2 96% Body mass index is 26.32 kg/(m^2).  Wt Readings from Last 3 Encounters:  04/13/15 222 lb (100.699 kg)  12/15/14 220 lb 12.8 oz (100.154 kg)  09/28/14 224 lb 14.4 oz (102.014 kg)   Constitutional: normal weight, in NAD Eyes: PERRLA, EOMI, no exophthalmos ENT: moist mucous membranes, no thyromegaly, no cervical  lymphadenopathy Cardiovascular: RRR, No MRG Respiratory: CTA B Gastrointestinal: abdomen soft, NT, ND, BS+ Musculoskeletal: no deformities, strength intact in all 4 Skin: moist, warm, no rashes  ASSESSMENT: 1. DM2, insulin-dependent, uncontrolled, with complications  - PN  PLAN:  1. Patient with well controlled diabetes, on basal insulin + maximal metformin dose + Invokana. Sugars are at goal. He pays 200$ for Invokana (with discount card) and 400$ for Levemir >> advised him to call insurance and ask about alternatives. - I  suggested to  Patient Instructions  Please continue: - Metformin 1000 mg 2x a day with meals - Invokana 100 mg in am - Levemir 12 units at bedtime  Please return in 4 months with your sugar log.   - continue checking sugars at different times of the day - check 2 times a day, rotating checks  - he needs a new yearly eye exam - will check Lipids (fasting) and CMP, ACR today - new HbA1c >> 5.9% (excellent) - Return to clinic in 4 mo with sugar log   Orders Only on 04/13/2015  Component Date Value Ref Range Status  . Hemoglobin A1C 04/13/2015 5.9   Final  Office Visit on 04/13/2015  Component Date Value Ref Range Status  . Sodium 04/13/2015 139  135 - 146 mmol/L Final  . Potassium 04/13/2015 4.9  3.5 - 5.3 mmol/L Final  . Chloride 04/13/2015 105  98 - 110 mmol/L Final  . CO2 04/13/2015 25  20 - 31 mmol/L Final  . Glucose, Bld 04/13/2015 109* 65 - 99 mg/dL Final  . BUN 04/13/2015 21  7 - 25 mg/dL Final  . Creat 04/13/2015 0.86  0.70 - 1.25 mg/dL Final  . Total Bilirubin 04/13/2015 0.8  0.2 - 1.2 mg/dL Final  . Alkaline Phosphatase 04/13/2015 69  40 - 115 U/L Final  . AST 04/13/2015 18  10 - 35 U/L Final  . ALT 04/13/2015 16  9 - 46 U/L Final  . Total Protein 04/13/2015 6.8  6.1 - 8.1 g/dL Final  . Albumin 04/13/2015 4.5  3.6 - 5.1 g/dL Final  . Calcium 04/13/2015 9.1  8.6 - 10.3 mg/dL Final  . GFR, Est African American 04/13/2015 >89  >=60 mL/min Final  . GFR, Est Non African American 04/13/2015 >89  >=60 mL/min Final   Comment:   The estimated GFR is a calculation valid for adults (>=24 years old) that uses the CKD-EPI algorithm to adjust for age and sex. It is   not to be used for children, pregnant women, hospitalized patients,    patients on dialysis, or with rapidly changing kidney function. According to the NKDEP, eGFR >89 is normal, 60-89 shows mild impairment, 30-59 shows moderate impairment, 15-29 shows severe impairment and <15 is ESRD.     Marland Kitchen Cholesterol  04/13/2015 139  0 - 200 mg/dL Final   ATP III Classification       Desirable:  < 200 mg/dL               Borderline High:  200 - 239 mg/dL          High:  > = 240 mg/dL  . Triglycerides 04/13/2015 248.0* 0.0 - 149.0 mg/dL Final   Normal:  <150 mg/dLBorderline High:  150 - 199 mg/dL  . HDL 04/13/2015 37.40* >39.00 mg/dL Final  . VLDL 04/13/2015 49.6* 0.0 - 40.0 mg/dL Final  . Total CHOL/HDL Ratio 04/13/2015 4   Final  Men          Women1/2 Average Risk     3.4          3.3Average Risk          5.0          4.42X Average Risk          9.6          7.13X Average Risk          15.0          11.0                      . NonHDL 04/13/2015 101.69   Final   NOTE:  Non-HDL goal should be 30 mg/dL higher than patient's LDL goal (i.e. LDL goal of < 70 mg/dL, would have non-HDL goal of < 100 mg/dL)  . Microalb, Ur 04/13/2015 <0.7  0.0 - 1.9 mg/dL Final  . Creatinine,U 04/13/2015 112.0   Final  . Microalb Creat Ratio 04/13/2015 0.6  0.0 - 30.0 mg/g Final  . Direct LDL 04/13/2015 70.0   Final   Optimal:  <100 mg/dLNear or Above Optimal:  100-129 mg/dLBorderline High:  130-159 mg/dLHigh:  160-189 mg/dLVery High:  >190 mg/dL   ACR normal. CMP normal. LDL at goal. HDL slightly low and triglycerides elevated.

## 2015-04-14 ENCOUNTER — Other Ambulatory Visit: Payer: Self-pay | Admitting: Internal Medicine

## 2015-05-03 ENCOUNTER — Other Ambulatory Visit: Payer: Self-pay | Admitting: Internal Medicine

## 2015-07-13 ENCOUNTER — Other Ambulatory Visit: Payer: Self-pay | Admitting: Internal Medicine

## 2015-07-13 MED ORDER — CANAGLIFLOZIN 100 MG PO TABS: 100.0000 mg | ORAL_TABLET | Freq: Every morning | ORAL | Status: DC

## 2015-07-23 ENCOUNTER — Other Ambulatory Visit: Payer: Self-pay

## 2015-07-23 MED ORDER — METFORMIN HCL 500 MG PO TABS: ORAL_TABLET | ORAL | Status: DC

## 2015-07-23 NOTE — Telephone Encounter (Signed)
[facility] was sent in. Next appointment 08/12/15

## 2015-08-02 ENCOUNTER — Other Ambulatory Visit: Payer: Self-pay | Admitting: Internal Medicine

## 2015-08-12 ENCOUNTER — Encounter: Payer: Self-pay | Admitting: Internal Medicine

## 2015-08-12 ENCOUNTER — Ambulatory Visit (INDEPENDENT_AMBULATORY_CARE_PROVIDER_SITE_OTHER): Payer: Managed Care, Other (non HMO) | Admitting: Internal Medicine

## 2015-08-12 VITALS — BP 110/70 | HR 66 | Ht 76.5 in | Wt 226.0 lb

## 2015-08-12 DIAGNOSIS — E1165 Type 2 diabetes mellitus with hyperglycemia: Secondary | ICD-10-CM | POA: Diagnosis not present

## 2015-08-12 DIAGNOSIS — IMO0002 Reserved for concepts with insufficient information to code with codable children: Secondary | ICD-10-CM

## 2015-08-12 DIAGNOSIS — E1149 Type 2 diabetes mellitus with other diabetic neurological complication: Secondary | ICD-10-CM

## 2015-08-12 LAB — POCT GLYCOSYLATED HEMOGLOBIN (HGB A1C): Hemoglobin A1C: 5.9

## 2015-08-12 NOTE — Progress Notes (Signed)
Patient ID: Clifford Alvarez, male   DOB: [birth year], 60 y.o.   MRN: [id number]  HPI: [name] is a 60 y.o.-year-old male, returning for f/u for DM2 dx 01/28/2013, insulin-dependent since dx, uncontrolled, with complications (Peripheral neuropathy). Last visit was 4 mo ago. Changed from [place] to [place] now. Most meds are more expensive, except Levemir, which is cheaper now.  Last hemoglobin A1c: Lab Results  Component Value Date   HGBA1C 5.9 04/13/2015   HGBA1C 5.8 12/15/2014   HGBA1C 5.9 08/17/2014   Pt is on a regimen of: - Levemir 20 >> 15 >> 12 units qhs  - Metformin 1000 mg bid - Invokana 100 mg in am. No SEs. He injects insulin correctly.  Pt checks his sugars 2x a day: - am: 103-143 >> 91-130 >> 104-130 >> 123-150, 163 >> 101-141, 152 >> 110-130 >> 115-135 - 2h after b'fast: n/c >> 146 >> 197 >> n/c  - before lunch: n/c >> 135-158 (206) >> 102, 128 >> n/c - 2h after lunch: 117-136 >> 117, 143 >> 104-176 >> n/c >> 126 >> n/c - before dinner: n/c >> n/c - 2h after dinner: n/c >> 108-173 >> 125-145, 170 >> 100-156, 170x1 - bedtime: 104-176 >> 90-130, 172 >> 97, 101-154 >> n/c >> 120-174 (higher while in [place]) >> n/c - nighttime: 119-139 >> 123-145, 170 >> n/c No lows. Lowest sugar was 97 >> 108 >> 101 >> 94 >> 96,  has hypoglycemia awareness. Highest sugar was 154 >> 127 >> 190 >> 199 >> 170x1 >> 170 x1.  Meter: [employer] >> One Touch verio  Pt's meals are: - Breakfast: bagel, oatmeal, granola, eggs - Lunch: meat + 2-3 vegetables; soup; salad; sandwich - Dinner: meat + 2-3 vegetables; pasta; [place]; seafood - Snacks: 1 a day >> nuts, popcorn, dark chocolate No regular exercise.  - no CKD, last BUN/creatinine:  Lab Results  Component Value Date   BUN 21 04/13/2015   CREATININE 0.86 04/13/2015   Lab Results  Component Value Date   GFRNONAA >89 04/13/2015   Component     Latest Ref Rng & Units 03/12/2013 05/11/2014 04/13/2015  Microalb, Ur     0.0  - 1.9 mg/dL 0.5 0.7 <4.0  Creatinine,U     mg/dL 981.1 914.7 829.5  MICROALB/CREAT RATIO     0.0 - 30.0 mg/g 0.3 0.7 0.6   He is on Losartan.  - He has HL. last set of lipids: Lab Results  Component Value Date   CHOL 139 04/13/2015   HDL 37.40 (L) 04/13/2015   LDLCALC 51 05/11/2014   LDLDIRECT 70.0 04/13/2015   TRIG 248.0 (H) 04/13/2015   CHOLHDL 4 04/13/2015  He is on Lipitor. - last eye exam was in 08/12/2015 [facility]. No DR. Has glaucoma. Occasional + blurry vision. Dr. Lottie Dawson. - no numbness and tingling in his feet. Foot exam 01/28/2013: neuropathy L  I reviewed pt's medications, allergies, PMH, social hx, family hx, and changes were documented in the history of present illness. Otherwise, unchanged from my initial visit note.  ROS: Constitutional: no weight loss, no fatigue, no subjective hyperthermia/hypothermia Eyes: no blurry vision, no xerophthalmia ENT: no sore throat, no nodules palpated in throat, no dysphagia/odynophagia, no hoarseness;  Cardiovascular: no CP/SOB/palpitations/leg swelling Respiratory: no cough/no SOB/wheezing Gastrointestinal: no N/V/D/C Musculoskeletal: no muscle/joint aches Skin: no rashes Neurological: no tremors/numbness/tingling/dizziness, no HA  PE: BP 110/70 (BP Location: Left Arm, Patient Position: Sitting)   Pulse 66   Ht 6' 4.5" (  1.943 m)   Wt 226 lb (102.5 kg)   SpO2 95%   BMI 27.15 kg/m  Body mass index is 27.15 kg/m.  Wt Readings from Last 3 Encounters:  08/12/15 226 lb (102.5 kg)  04/13/15 222 lb (100.7 kg)  12/15/14 220 lb 12.8 oz (100.2 kg)   Constitutional: normal weight, in NAD Eyes: PERRLA, EOMI, no exophthalmos ENT: moist mucous membranes, no thyromegaly, no cervical lymphadenopathy Cardiovascular: RRR, No MRG Respiratory: CTA B Gastrointestinal: abdomen soft, NT, ND, BS+ Musculoskeletal: no deformities, strength intact in all 4 Skin: moist, warm, no rashes  ASSESSMENT: 1. DM2, insulin-dependent,  uncontrolled, with complications  - PN  PLAN:  1. Patient with well controlled diabetes, on basal insulin + maximal metformin dose + Invokana. Sugars are at goal. - discussed about the CANVAS study with Invokana and the increased risk in amputations - most in men, HbA1c >8, PN or PVD. - I suggested to: Patient Instructions  Please continue: - Metformin 1000 mg 2x a day with meals - Invokana 100 mg in am - Levemir 12 units at bedtime  Please return in 4 months with your sugar log.   - continue checking sugars at different times of the day - check 1-2 times a day, rotating checks  - he needs a new yearly eye exam >> has one tomorrow - new HbA1c >> 5.9% (excellent) - Return to clinic in 4 mo with sugar log

## 2015-08-12 NOTE — Patient Instructions (Signed)
Please continue: - Metformin 1000 mg 2x a day with meals - Invokana 100 mg in am - Levemir 12 units at bedtime  Please return in 4 months with your sugar log.

## 2015-08-28 ENCOUNTER — Encounter: Payer: Self-pay | Admitting: Internal Medicine

## 2015-09-01 ENCOUNTER — Other Ambulatory Visit: Payer: Self-pay

## 2015-09-01 MED ORDER — METFORMIN HCL 500 MG PO TABS: ORAL_TABLET | ORAL | 0 refills | Status: DC

## 2015-09-01 MED ORDER — CANAGLIFLOZIN 100 MG PO TABS: 100.0000 mg | ORAL_TABLET | Freq: Every morning | ORAL | 0 refills | Status: DC

## 2015-09-02 ENCOUNTER — Other Ambulatory Visit: Payer: Self-pay

## 2015-09-02 ENCOUNTER — Telehealth: Payer: Self-pay | Admitting: Internal Medicine

## 2015-09-02 MED ORDER — METFORMIN HCL 500 MG PO TABS: ORAL_TABLET | ORAL | 3 refills | Status: DC

## 2015-09-02 MED ORDER — CANAGLIFLOZIN 100 MG PO TABS: 100.0000 mg | ORAL_TABLET | Freq: Every morning | ORAL | 3 refills | Status: DC

## 2015-09-02 NOTE — Telephone Encounter (Signed)
° ° °  Pt req refill and is wanting a hand written rx to take to different pharmacies.  Pt is a [facility] pt and did not want to establish with anyone [place]aniel[place]aid he was in the process of going to another practice   atorvastatin (LIPITOR) 20 MG tablet  losartan (COZAAR) 25 MG tablet

## 2015-09-02 NOTE — Telephone Encounter (Signed)
Patient came and picked up medications this morning, patient asked for lipitor and cozaar which I advised needed to be filled by PCP.

## 2015-09-03 ENCOUNTER — Other Ambulatory Visit: Payer: Self-pay | Admitting: Internal Medicine

## 2015-09-03 ENCOUNTER — Encounter: Payer: Self-pay | Admitting: Internal Medicine

## 2015-09-03 MED ORDER — INSULIN GLARGINE 100 UNIT/ML SOLOSTAR PEN: 12.0000 [IU] | PEN_INJECTOR | Freq: Every day | SUBCUTANEOUS | 5 refills | Status: DC

## 2015-09-03 NOTE — Telephone Encounter (Signed)
Spoke with Dr. Durene CalHunter and he stated patient can establish with him on September 11th (2:15 or 2:45 slots) if he chooses to stay within [place]. However, if not, need to know when appointment is with other practice. If appointment is within 3 months, we can refill meds for 3 months. If past 3 months, patient must schedule with Dr. Durene CalHunter for acute appointment for refills. Left message on patient's voicemail; waiting for return call.

## 2015-09-09 ENCOUNTER — Other Ambulatory Visit: Payer: Self-pay

## 2015-09-09 MED ORDER — ATORVASTATIN CALCIUM 20 MG PO TABS: ORAL_TABLET | ORAL | 0 refills | Status: DC

## 2015-09-09 MED ORDER — LOSARTAN POTASSIUM 25 MG PO TABS: ORAL_TABLET | ORAL | 0 refills | Status: DC

## 2015-09-09 NOTE — Addendum Note (Signed)
Addended by: Shade FloodJONES, MICHELLE O on: 09/09/2015 03:37 PM   Modules accepted: Orders

## 2015-09-09 NOTE — Telephone Encounter (Signed)
Clifford Alvarez the pt has an appt with dr hunter on 09-29-15 and please refill [facility] medicine [facility]

## 2015-09-09 NOTE — Telephone Encounter (Signed)
Spoke with patient who [facility] [address]

## 2015-09-09 NOTE — Telephone Encounter (Signed)
Noted. Rx's printed (original message stated patient requested paper rx) and awaiting provider signature. Rx's currently with Asher MuirJamie, LPN.

## 2015-09-24 ENCOUNTER — Telehealth: Payer: Self-pay

## 2015-09-24 NOTE — Telephone Encounter (Signed)
Called patient to set up a new patient app with Dr. Posey ReaPlotnikov. No answer, I left a VM for him to call back.

## 2015-09-29 ENCOUNTER — Ambulatory Visit: Payer: Managed Care, Other (non HMO) | Admitting: Family Medicine

## 2015-10-06 ENCOUNTER — Encounter: Payer: Self-pay | Admitting: Internal Medicine

## 2015-10-06 ENCOUNTER — Ambulatory Visit (INDEPENDENT_AMBULATORY_CARE_PROVIDER_SITE_OTHER): Payer: Managed Care, Other (non HMO) | Admitting: Internal Medicine

## 2015-10-06 VITALS — BP 114/70 | HR 70 | Ht 77.0 in | Wt 229.0 lb

## 2015-10-06 DIAGNOSIS — L84 Corns and callosities: Secondary | ICD-10-CM

## 2015-10-06 DIAGNOSIS — Z23 Encounter for immunization: Secondary | ICD-10-CM | POA: Diagnosis not present

## 2015-10-06 DIAGNOSIS — B351 Tinea unguium: Secondary | ICD-10-CM | POA: Diagnosis not present

## 2015-10-06 DIAGNOSIS — Z0000 Encounter for general adult medical examination without abnormal findings: Secondary | ICD-10-CM

## 2015-10-06 DIAGNOSIS — I10 Essential (primary) hypertension: Secondary | ICD-10-CM

## 2015-10-06 DIAGNOSIS — E1149 Type 2 diabetes mellitus with other diabetic neurological complication: Secondary | ICD-10-CM | POA: Diagnosis not present

## 2015-10-06 DIAGNOSIS — E559 Vitamin D deficiency, unspecified: Secondary | ICD-10-CM

## 2015-10-06 DIAGNOSIS — B353 Tinea pedis: Secondary | ICD-10-CM

## 2015-10-06 DIAGNOSIS — Q667 Congenital pes cavus, unspecified foot: Secondary | ICD-10-CM

## 2015-10-06 DIAGNOSIS — R0989 Other specified symptoms and signs involving the circulatory and respiratory systems: Secondary | ICD-10-CM

## 2015-10-06 DIAGNOSIS — IMO0002 Reserved for concepts with insufficient information to code with codable children: Secondary | ICD-10-CM

## 2015-10-06 DIAGNOSIS — E1165 Type 2 diabetes mellitus with hyperglycemia: Secondary | ICD-10-CM

## 2015-10-06 MED ORDER — ATORVASTATIN CALCIUM 20 MG PO TABS: ORAL_TABLET | ORAL | 3 refills | Status: DC

## 2015-10-06 MED ORDER — CICLOPIROX 8 % EX SOLN: Freq: Every day | CUTANEOUS | 0 refills | Status: DC

## 2015-10-06 MED ORDER — LOSARTAN POTASSIUM 25 MG PO TABS: ORAL_TABLET | ORAL | 3 refills | Status: DC

## 2015-10-06 MED ORDER — KETOCONAZOLE 2 % EX CREA: TOPICAL_CREAM | Freq: Two times a day (BID) | CUTANEOUS | Status: DC

## 2015-10-06 NOTE — Progress Notes (Signed)
Pre visit review using our clinic review tool, if applicable. No additional management support is needed unless otherwise documented below in the visit note. 

## 2015-10-06 NOTE — Assessment & Plan Note (Signed)
On Rx 

## 2015-10-06 NOTE — Progress Notes (Signed)
Subjective:  Patient ID: Clifford Alvarez, male    DOB: 11/20/1955  Age: 60 y.o. MRN: [id number]  CC: No chief complaint on file.   HPI Clifford Alvarez presents for a well exam. C/o feet neuropathy  Outpatient Medications Prior to Visit  Medication Sig Dispense Refill  . atorvastatin (LIPITOR) 20 MG tablet TAKE 1 TABLET (20 MG TOTAL) BY MOUTH DAILY. 30 tablet 0  . BD PEN NEEDLE NANO U/F 32G X 4 MM MISC USE 1 EACH DAY AT BEDTIME 100 each 3  . Blood Glucose Monitoring Suppl (ONETOUCH VERIO) W/DEVICE KIT 1 each by Does not apply route daily. 1 kit 0  . canagliflozin (INVOKANA) 100 MG TABS tablet Take 1 tablet (100 mg total) by mouth every morning. 90 tablet 3  . dorzolamide-timolol (COSOPT) 22.3-6.8 MG/ML ophthalmic solution Place 1 drop into both eyes 2 (two) times daily.    . Insulin Glargine (LANTUS SOLOSTAR) 100 UNIT/ML Solostar Pen Inject 12 Units into the skin daily at 10 pm. 15 mL 5  . latanoprost (XALATAN) 0.005 % ophthalmic solution Place 1 drop into both eyes at bedtime.     Marland Kitchen losartan (COZAAR) 25 MG tablet TAKE 1 TABLET (25 MG TOTAL) BY MOUTH DAILY. 30 tablet 0  . metFORMIN (GLUCOPHAGE) 500 MG tablet TAKE 2 TABLETS (1,000 MG TOTAL) BY MOUTH 2 (TWO) TIMES DAILY WITH A MEAL. 360 tablet 3  . ONETOUCH DELICA LANCETS FINE MISC Use to test blood sugar 3 times daily as instructed. 100 each 11  . ONETOUCH VERIO test strip USE TO TEST BLOOD SUGAR 3 TIMES DAILY AS INSTRUCTED. 100 each 6  . ondansetron (ZOFRAN) 4 MG tablet Take 1 tablet (4 mg total) by mouth every 8 (eight) hours as needed for nausea or vomiting. (Patient not taking: Reported on 10/06/2015) 20 tablet 0   No facility-administered medications prior to visit.     ROS Review of Systems  Constitutional: Negative for appetite change, fatigue and unexpected weight change.  HENT: Negative for congestion, nosebleeds, sneezing, sore throat and trouble swallowing.   Eyes: Negative for itching and visual disturbance.    Respiratory: Negative for cough.   Cardiovascular: Negative for chest pain, palpitations and leg swelling.  Gastrointestinal: Negative for abdominal distention, blood in stool, diarrhea and nausea.  Genitourinary: Negative for frequency and hematuria.  Musculoskeletal: Negative for back pain, gait problem, joint swelling and neck pain.  Skin: Negative for rash.  Neurological: Positive for numbness. Negative for dizziness, tremors, speech difficulty and weakness.  Psychiatric/Behavioral: Negative for agitation, dysphoric mood and sleep disturbance. The patient is not nervous/anxious.      Objective:  BP 114/70   Pulse 70   Ht 6' 5"  (1.956 m)   Wt 229 lb (103.9 kg)   SpO2 97%   BMI 27.16 kg/m   BP Readings from Last 3 Encounters:  10/06/15 114/70  08/12/15 110/70  04/13/15 108/70    Wt Readings from Last 3 Encounters:  10/06/15 229 lb (103.9 kg)  08/12/15 226 lb (102.5 kg)  04/13/15 222 lb (100.7 kg)    Physical Exam  Constitutional: He is oriented to person, place, and time. He appears well-developed and well-nourished. No distress.  HENT:  Head: Normocephalic and atraumatic.  Right Ear: External ear normal.  Left Ear: External ear normal.  Nose: Nose normal.  Mouth/Throat: Oropharynx is clear and moist. No oropharyngeal exudate.  Eyes: Conjunctivae and EOM are normal. Pupils are equal, round, and reactive to light. Right eye exhibits no discharge. Left  eye exhibits no discharge. No scleral icterus.  Neck: Normal range of motion. Neck supple. No JVD present. No tracheal deviation present. No thyromegaly present.  Cardiovascular: Normal rate, regular rhythm, normal heart sounds and intact distal pulses.  Exam reveals no gallop and no friction rub.   No murmur heard. Pulmonary/Chest: Effort normal and breath sounds normal. No stridor. No respiratory distress. He has no wheezes. He has no rales. He exhibits no tenderness.  Abdominal: Soft. Bowel sounds are normal. He  exhibits no distension and no mass. There is no tenderness. There is no rebound and no guarding.  Genitourinary: Rectum normal, prostate normal and penis normal. Rectal exam shows guaiac negative stool. No penile tenderness.  Musculoskeletal: Normal range of motion. He exhibits no edema or tenderness.  Lymphadenopathy:    He has no cervical adenopathy.  Neurological: He is alert and oriented to person, place, and time. He has normal reflexes. No cranial nerve deficit. He exhibits normal muscle tone. Coordination normal.  Skin: Skin is warm and dry. Rash noted. He is not diaphoretic. No erythema. No pallor.  Psychiatric: He has a normal mood and affect. His behavior is normal. Judgment and thought content normal.  mild gynecomastia B feet w/high arches, scaly purplish skin and toenail onychomycosis. There was a callus on the L foot   Procedure:  L  foot callus paring/cutting  Indication:   L foot callus, painful  Consent: verbal  Risks and benefits were explained to the patient. Skin was cleaned with alcohol. I removed a large callus carefully with a round blade. Skin remained intact. Pain is better. Tolerated well. Complications: none. Bandaid applied   Lab Results  Component Value Date   WBC 7.8 05/11/2014   HGB 15.9 05/11/2014   HCT 46.7 05/11/2014   PLT 184.0 05/11/2014   GLUCOSE 109 (H) 04/13/2015   CHOL 139 04/13/2015   TRIG 248.0 (H) 04/13/2015   HDL 37.40 (L) 04/13/2015   LDLDIRECT 70.0 04/13/2015   LDLCALC 51 05/11/2014   ALT 16 04/13/2015   AST 18 04/13/2015   NA 139 04/13/2015   K 4.9 04/13/2015   CL 105 04/13/2015   CREATININE 0.86 04/13/2015   BUN 21 04/13/2015   CO2 25 04/13/2015   TSH 2.61 05/11/2014   PSA 0.60 05/11/2014   HGBA1C 5.9 08/12/2015   MICROALBUR <0.7 04/13/2015    Patient was never admitted.  Assessment & Plan:   There are no diagnoses linked to this encounter. I am having Clifford Alvarez maintain his latanoprost, dorzolamide-timolol, ONETOUCH  VERIO, ONETOUCH DELICA LANCETS FINE, ondansetron, BD PEN NEEDLE NANO U/F, ONETOUCH VERIO, metFORMIN, canagliflozin, Insulin Glargine, atorvastatin, and losartan.  No orders of the defined types were placed in this encounter.    Follow-up: No Follow-up on file.  Walker Kehr, MD

## 2015-10-06 NOTE — Assessment & Plan Note (Signed)
Labs

## 2015-10-07 ENCOUNTER — Other Ambulatory Visit (INDEPENDENT_AMBULATORY_CARE_PROVIDER_SITE_OTHER): Payer: Managed Care, Other (non HMO)

## 2015-10-07 DIAGNOSIS — B353 Tinea pedis: Secondary | ICD-10-CM | POA: Insufficient documentation

## 2015-10-07 DIAGNOSIS — E1165 Type 2 diabetes mellitus with hyperglycemia: Secondary | ICD-10-CM | POA: Diagnosis not present

## 2015-10-07 DIAGNOSIS — IMO0002 Reserved for concepts with insufficient information to code with codable children: Secondary | ICD-10-CM

## 2015-10-07 DIAGNOSIS — Z0000 Encounter for general adult medical examination without abnormal findings: Secondary | ICD-10-CM | POA: Diagnosis not present

## 2015-10-07 DIAGNOSIS — E1149 Type 2 diabetes mellitus with other diabetic neurological complication: Secondary | ICD-10-CM

## 2015-10-07 DIAGNOSIS — R0989 Other specified symptoms and signs involving the circulatory and respiratory systems: Secondary | ICD-10-CM | POA: Insufficient documentation

## 2015-10-07 DIAGNOSIS — L84 Corns and callosities: Secondary | ICD-10-CM | POA: Insufficient documentation

## 2015-10-07 DIAGNOSIS — Q667 Congenital pes cavus, unspecified foot: Secondary | ICD-10-CM | POA: Insufficient documentation

## 2015-10-07 DIAGNOSIS — B351 Tinea unguium: Secondary | ICD-10-CM | POA: Insufficient documentation

## 2015-10-07 DIAGNOSIS — E559 Vitamin D deficiency, unspecified: Secondary | ICD-10-CM | POA: Insufficient documentation

## 2015-10-07 DIAGNOSIS — G588 Other specified mononeuropathies: Secondary | ICD-10-CM

## 2015-10-07 LAB — URINALYSIS
Bilirubin Urine: NEGATIVE
HGB URINE DIPSTICK: NEGATIVE
Ketones, ur: NEGATIVE
Leukocytes, UA: NEGATIVE
NITRITE: NEGATIVE
PH: 5.5 (ref 5.0–8.0)
SPECIFIC GRAVITY, URINE: 1.02 (ref 1.000–1.030)
TOTAL PROTEIN, URINE-UPE24: NEGATIVE
Urine Glucose: >=1000 — AB
Urobilinogen, UA: 0.2 (ref 0.0–1.0)

## 2015-10-07 LAB — CBC WITH DIFFERENTIAL/PLATELET
BASOS ABS: 0 10*3/uL (ref 0.0–0.1)
Basophils Relative: 0.2 % (ref 0.0–3.0)
Eosinophils Absolute: 0.3 10*3/uL (ref 0.0–0.7)
Eosinophils Relative: 4.2 % (ref 0.0–5.0)
HEMATOCRIT: 45.8 % (ref 39.0–52.0)
HEMOGLOBIN: 15.3 g/dL (ref 13.0–17.0)
LYMPHS PCT: 27 % (ref 12.0–46.0)
Lymphs Abs: 2.2 10*3/uL (ref 0.7–4.0)
MCHC: 33.4 g/dL (ref 30.0–36.0)
MCV: 89.8 fl (ref 78.0–100.0)
MONOS PCT: 11 % (ref 3.0–12.0)
Monocytes Absolute: 0.9 10*3/uL (ref 0.1–1.0)
NEUTROS PCT: 57.6 % (ref 43.0–77.0)
Neutro Abs: 4.8 10*3/uL (ref 1.4–7.7)
Platelets: 180 10*3/uL (ref 150.0–400.0)
RBC: 5.1 Mil/uL (ref 4.22–5.81)
RDW: 13.3 % (ref 11.5–15.5)
WBC: 8.3 10*3/uL (ref 4.0–10.5)

## 2015-10-07 LAB — BASIC METABOLIC PANEL
BUN: 18 mg/dL (ref 6–23)
CALCIUM: 9.1 mg/dL (ref 8.4–10.5)
CO2: 28 mEq/L (ref 19–32)
Chloride: 104 mEq/L (ref 96–112)
Creatinine, Ser: 0.95 mg/dL (ref 0.40–1.50)
GFR: 85.52 mL/min (ref >60.00)
Glucose, Bld: 110 mg/dL — ABNORMAL HIGH (ref 70–99)
Potassium: 4.2 mEq/L (ref 3.5–5.1)
SODIUM: 140 meq/L (ref 135–145)

## 2015-10-07 LAB — HEPATIC FUNCTION PANEL
ALBUMIN: 4.3 g/dL (ref 3.5–5.2)
ALK PHOS: 82 U/L (ref 39–117)
ALT: 16 U/L (ref 0–53)
AST: 16 U/L (ref 0–37)
Bilirubin, Direct: 0.2 mg/dL (ref 0.0–0.3)
TOTAL PROTEIN: 6.9 g/dL (ref 6.0–8.3)
Total Bilirubin: 1.1 mg/dL (ref 0.2–1.2)

## 2015-10-07 LAB — LIPID PANEL
CHOL/HDL RATIO: 3
Cholesterol: 123 mg/dL (ref 0–200)
HDL: 38.7 mg/dL — ABNORMAL LOW (ref >39.00)
LDL CALC: 55 mg/dL (ref 0–99)
NONHDL: 83.95
Triglycerides: 145 mg/dL (ref 0.0–149.0)
VLDL: 29 mg/dL (ref 0.0–40.0)

## 2015-10-07 LAB — TSH: TSH: 4.08 u[IU]/mL (ref 0.35–4.50)

## 2015-10-07 LAB — VITAMIN D 25 HYDROXY (VIT D DEFICIENCY, FRACTURES): VITD: 19.52 ng/mL — ABNORMAL LOW (ref 30.00–100.00)

## 2015-10-07 LAB — VITAMIN B12: Vitamin B-12: 306 pg/mL (ref 211–911)

## 2015-10-07 LAB — PSA: PSA: 0.63 ng/mL (ref 0.10–4.00)

## 2015-10-07 MED ORDER — B COMPLEX PO TABS: 1.0000 | ORAL_TABLET | Freq: Every day | ORAL | 3 refills | Status: DC

## 2015-10-07 MED ORDER — ERGOCALCIFEROL 1.25 MG (50000 UT) PO CAPS: 50000.0000 [IU] | ORAL_CAPSULE | ORAL | 0 refills | Status: DC

## 2015-10-07 MED ORDER — VITAMIN D3 50 MCG (2000 UT) PO CAPS: 2000.0000 [IU] | ORAL_CAPSULE | Freq: Every day | ORAL | 3 refills | Status: DC

## 2015-10-07 NOTE — Assessment & Plan Note (Signed)
Mild - carot doppler

## 2015-10-07 NOTE — Assessment & Plan Note (Signed)
[place] Treat T pedis too

## 2015-10-07 NOTE — Assessment & Plan Note (Addendum)
Paring Wide shoes Good arch support

## 2015-10-07 NOTE — Assessment & Plan Note (Signed)
start Vit D prescription 50000 iu weekly (Rx emailed to your pharmacy) followed by over-the-counter Vit D 2000 iu daily.  

## 2015-10-07 NOTE — Assessment & Plan Note (Signed)
Vit B complex Vit D

## 2015-10-07 NOTE — Assessment & Plan Note (Signed)
B feet: Ketoconazole cream

## 2015-10-07 NOTE — Assessment & Plan Note (Signed)
Wide shoes Arch supports

## 2015-10-08 LAB — HEPATITIS C ANTIBODY: HCV AB: NEGATIVE

## 2015-11-08 ENCOUNTER — Other Ambulatory Visit: Payer: Self-pay | Admitting: Internal Medicine

## 2015-11-21 ENCOUNTER — Other Ambulatory Visit: Payer: Self-pay | Admitting: Internal Medicine

## 2015-12-04 ENCOUNTER — Other Ambulatory Visit: Payer: Self-pay | Admitting: Internal Medicine

## 2015-12-13 ENCOUNTER — Encounter: Payer: Self-pay | Admitting: Internal Medicine

## 2015-12-13 ENCOUNTER — Ambulatory Visit (INDEPENDENT_AMBULATORY_CARE_PROVIDER_SITE_OTHER): Payer: Managed Care, Other (non HMO) | Admitting: Internal Medicine

## 2015-12-13 VITALS — BP 110/68 | HR 78 | Ht 77.0 in | Wt 226.4 lb

## 2015-12-13 DIAGNOSIS — E1142 Type 2 diabetes mellitus with diabetic polyneuropathy: Secondary | ICD-10-CM

## 2015-12-13 DIAGNOSIS — E1040 Type 1 diabetes mellitus with diabetic neuropathy, unspecified: Secondary | ICD-10-CM

## 2015-12-13 LAB — POCT GLYCOSYLATED HEMOGLOBIN (HGB A1C): Hemoglobin A1C: 6

## 2015-12-13 MED ORDER — BASAGLAR KWIKPEN 100 UNIT/ML ~~LOC~~ SOPN: 12.0000 [IU] | PEN_INJECTOR | Freq: Every day | SUBCUTANEOUS | 5 refills | Status: DC

## 2015-12-13 MED ORDER — EMPAGLIFLOZIN 25 MG PO TABS
25.0000 mg | ORAL_TABLET | Freq: Every day | ORAL | 3 refills | Status: DC
Start: 2015-12-13 — End: 2015-12-16

## 2015-12-13 NOTE — Progress Notes (Signed)
Patient ID: Clifford Alvarez, male   DOB: 07/26/1955, 60 y.o.   MRN: [id number]  HPI: Clifford Alvarez is a 60 y.o.-year-old male, returning for f/u for DM2 dx 01/28/2013, insulin-dependent since dx, controlled, with complications (Peripheral neuropathy). Last visit was 4 mo ago.  Last hemoglobin A1c: Lab Results  Component Value Date   HGBA1C 5.9 08/12/2015   HGBA1C 5.9 04/13/2015   HGBA1C 5.8 12/15/2014   Pt is on a regimen of: - Lantus 20 >> 15 >> 12 units qhs  - Metformin 1000 mg bid - Invokana 100 mg in am. No SEs. Has [employer] insurance. Most meds are more expensive, except Levemir, which is cheaper now.  Pt checks his sugars 2x a day: - am: 104-130 >> 123-150, 163 >> 101-141, 152 >> 110-130 >> 115-135 >> 115-135 - 2h after b'fast: n/c >> 146 >> 197 >> n/c  - before lunch: n/c >> 135-158 (206) >> 102, 128 >> n/c - 2h after lunch: 117-136 >> 117, 143 >> 104-176 >> n/c >> 126 >> n/c - before dinner: n/c >> n/c - 2h after dinner: n/c >> 108-173 >> 125-145, 170 >> 100-156, 170x1 >> 100-150s - bedtime: 97, 101-154 >> n/c >> 120-174 (higher [place] in [place] >> n/c - nighttime: 119-139 >> 123-145, 170 >> n/c No lows. Lowest sugar was 96 >> 90s,  has hypoglycemia awareness. Highest sugar was 170 x1 >> 170s  Meter: Bayer Contour >> One Touch verio  Pt's meals are: - Breakfast: bagel, oatmeal, granola, eggs - Lunch: meat + 2-3 vegetables; soup; salad; sandwich - Dinner: meat + 2-3 vegetables; pasta; [place]; seafood - Snacks: 1 a day >> nuts, popcorn, dark chocolate No regular exercise.  - no CKD, last BUN/creatinine:  Lab Results  Component Value Date   BUN 18 10/07/2015   CREATININE 0.95 10/07/2015   Lab Results  Component Value Date   GFRNONAA >89 04/13/2015   Component     Latest Ref Rng & Units 03/12/2013 05/11/2014 04/13/2015  Microalb, Ur     0.0 - 1.9 mg/dL 0.5 0.7 <7.2<0.7  Creatinine,U     mg/dL 536.6186.5 440.3103.0 474.2112.0  MICROALB/CREAT RATIO     0.0 - 30.0 mg/g 0.3  0.7 0.6   He is on Losartan.  - He has HL. last set of lipids: Lab Results  Component Value Date   CHOL 123 10/07/2015   HDL 38.70 (L) 10/07/2015   LDLCALC 55 10/07/2015   LDLDIRECT 70.0 04/13/2015   TRIG 145.0 10/07/2015   CHOLHDL 3 10/07/2015  He is on Lipitor. - last eye exam was in 08/12/2015 [facility]. No DR. Has glaucoma. Occasional + blurry vision. Dr. Lottie DawsonBond. - no numbness and tingling in his feet. Foot exam 01/28/2013: neuropathy L  I reviewed pt's medications, allergies, PMH, social hx, family hx, and changes were documented in the history of present illness. Otherwise, unchanged from my initial visit note.  ROS: Constitutional: no weight loss, no fatigue, no subjective hyperthermia/hypothermia Eyes: no blurry vision, no xerophthalmia ENT: no sore throat, no nodules palpated in throat, no dysphagia/odynophagia, no hoarseness;  Cardiovascular: no CP/SOB/palpitations/leg swelling Respiratory: no cough/no SOB/wheezing Gastrointestinal: no N/V/D/C Musculoskeletal: no muscle/joint aches Skin: no rashes Neurological: no tremors/numbness/tingling/dizziness, no HA  PE: BP 110/68 (BP Location: Left Arm, Patient Position: Sitting, Cuff Size: Large)   Pulse 78   Ht 6\' 5"  (1.956 m)   Wt 226 lb 6.4 oz (102.7 kg)   SpO2 99%   BMI 26.85 kg/m  Body mass index  is 26.85 kg/m.  Wt Readings from Last 3 Encounters:  12/13/15 226 lb 6.4 oz (102.7 kg)  10/06/15 229 lb (103.9 kg)  08/12/15 226 lb (102.5 kg)   Constitutional: normal weight, in NAD Eyes: PERRLA, EOMI, no exophthalmos ENT: moist mucous membranes, no thyromegaly, no cervical lymphadenopathy Cardiovascular: RRR, No MRG Respiratory: CTA B Gastrointestinal: abdomen soft, NT, ND, BS+ Musculoskeletal: no deformities, strength intact in all 4 Skin: moist, warm, no rashes  ASSESSMENT: 1. DM2, insulin-dependent, controlled, with complications  - PN  - in 08/2015, we discussed about the CANVAS study with Invokana and  the increased risk in amputations - most in men, HbA1c >8, PN or PVD.  PLAN:  1. Patient with well controlled diabetes, on basal insulin + maximal metformin dose + Invokana. Sugars are at goal, unchanged from last visit. Will continue need to make some changes in his regimen per insurance preference. - I suggested to: Patient Instructions  Please continue: - Metformin 1000 mg 2x a day with meals  Change to: - Jardiance 25 mg in am - basaglar 12 units at bedtime  Please return in 4 months with your sugar log.   - continue checking sugars at different times of the day - check 1-2 times a day, rotating checks  - he needs a new yearly eye exam >> he is UTD - UTD with flu shot - new HbA1c >> 6.0% (excellent) - Return to clinic in 4 mo with sugar log    Clifford Pavlovristina Nathifa Ritthaler, MD PhD [facility]

## 2015-12-13 NOTE — Patient Instructions (Addendum)
Please continue: - Metformin 1000 mg 2x a day with meals  Change to: - Jardiance 25 mg in am - Basaglar 12 units at bedtime  Please return in 4 months with your sugar log.

## 2015-12-16 ENCOUNTER — Other Ambulatory Visit: Payer: Self-pay

## 2015-12-16 MED ORDER — DAPAGLIFLOZIN PROPANEDIOL 10 MG PO TABS: 10.0000 mg | ORAL_TABLET | Freq: Every day | ORAL | 0 refills | Status: DC

## 2016-03-12 ENCOUNTER — Other Ambulatory Visit: Payer: Self-pay | Admitting: Internal Medicine

## 2016-03-23 ENCOUNTER — Other Ambulatory Visit: Payer: Self-pay

## 2016-03-23 MED ORDER — DAPAGLIFLOZIN PROPANEDIOL 10 MG PO TABS: 10.0000 mg | ORAL_TABLET | Freq: Every day | ORAL | 0 refills | Status: DC

## 2016-04-17 ENCOUNTER — Encounter: Payer: Self-pay | Admitting: Internal Medicine

## 2016-04-17 ENCOUNTER — Ambulatory Visit (INDEPENDENT_AMBULATORY_CARE_PROVIDER_SITE_OTHER): Payer: Managed Care, Other (non HMO) | Admitting: Internal Medicine

## 2016-04-17 VITALS — BP 146/72 | HR 78 | Temp 97.8°F | Ht 77.0 in | Wt 226.2 lb

## 2016-04-17 DIAGNOSIS — E1142 Type 2 diabetes mellitus with diabetic polyneuropathy: Secondary | ICD-10-CM

## 2016-04-17 LAB — POCT GLYCOSYLATED HEMOGLOBIN (HGB A1C): HEMOGLOBIN A1C: 6

## 2016-04-17 NOTE — Progress Notes (Signed)
Patient ID: Clifford Alvarez, male   DOB: 03/30/1955, 61 y.o.   MRN: [id number]  HPI: Clifford Alvarez is a 61 y.o.-year-old male, returning for f/u for DM2 dx 01/28/2013, insulin-dependent since dx, controlled, with complications (Peripheral neuropathy). Last visit was 4 mo ago.  Last hemoglobin A1c: Lab Results  Component Value Date   HGBA1C 6.0 12/13/2015   HGBA1C 5.9 08/12/2015   HGBA1C 5.9 04/13/2015   Pt was on a regimen of: - Lantus 20 >> 15 >> 12 units qhs  - Metformin 1000 mg bid - Invokana 100 mg in am. No SEs. Has [employer]. Most meds are more expensive, except Levemir, which is cheaper now.  At last visit, we had to change to (per insurance preference): - Metformin 1000 mg 2x a day with meals - Jardiance 25 mg in am - Basaglar (but he still continues Lantus until he finishes his stock) 12 units at bedtime  Pt checks his sugars 2x a day: - am: 104-130 >> 123-150, 163 >> 101-141, 152 >> 110-130 >> 115-135 >> 115-135  - 2h after b'fast: n/c >> 146 >> 197 >> n/c  - before lunch: n/c >> 135-158 (206) >> 102, 128 >> n/c - 2h after lunch: 117-136 >> 117, 143 >> 104-176 >> n/c >> 126 >> n/c - before dinner: n/c >> n/c - 2h after dinner: n/c >> 108-173 >> 125-145, 170 >> 100-156, 170x1 >> 100-150s >> 100-150 - bedtime: 97, 101-154 >> n/c >> 120-174 (higher while in [place]) >> n/c - nighttime: 119-139 >> 123-145, 170 >> n/c No lows. Lowest sugar was 96 >> 90s >> 100,  has hypoglycemia awareness. Highest sugar was 170 x1 >> 170s >> 150s  Meter: Bayer Contour >> One Touch verio  Pt's meals are: - Breakfast: bagel, oatmeal, granola, eggs - Lunch: meat + 2-3 vegetables; soup; salad; sandwich - Dinner: meat + 2-3 vegetables; pasta; [place]; seafood - Snacks: 1 a day >> nuts, popcorn, dark chocolate No regular exercise.  - no CKD, last BUN/creatinine:  Lab Results  Component Value Date   BUN 18 10/07/2015   CREATININE 0.95 10/07/2015   Lab Results  Component  Value Date   GFRNONAA >89 04/13/2015   Component     Latest Ref Rng & Units 03/12/2013 05/11/2014 04/13/2015  Microalb, Ur     0.0 - 1.9 mg/dL 0.5 0.7 <4.0  Creatinine,U     mg/dL 981.1 914.7 829.5  MICROALB/CREAT RATIO     0.0 - 30.0 mg/g 0.3 0.7 0.6   He is on Losartan.  - He has HL. last set of lipids: Lab Results  Component Value Date   CHOL 123 10/07/2015   HDL 38.70 (L) 10/07/2015   LDLCALC 55 10/07/2015   LDLDIRECT 70.0 04/13/2015   TRIG 145.0 10/07/2015   CHOLHDL 3 10/07/2015  He is on Lipitor. - last eye exam was in 08/2015 [facility]. No DR. Has glaucoma >> controlled. No blurry vision. Dr. Lottie Dawson. - no numbness and tingling in his feet. Foot exam 01/28/2013: neuropathy L  In few weeks, he will go to [place] and later this summer, to a ranch near [place], with his family.  I reviewed pt's medications, allergies, PMH, social hx, family hx, and changes were documented in the history of present illness. Otherwise, unchanged from my initial visit note.  ROS: Constitutional: no weight loss, no fatigue, no subjective hyperthermia/hypothermia Eyes: no blurry vision, no xerophthalmia ENT: no sore throat, no nodules palpated in throat, no dysphagia/odynophagia, no  hoarseness;  Cardiovascular: no CP/SOB/palpitations/leg swelling Respiratory: no cough/no SOB/wheezing Gastrointestinal: no N/V/D/C Musculoskeletal: no muscle/joint aches Skin: no rashes Neurological: no tremors/numbness/tingling/dizziness, no HA  PE: BP (!) 146/72 (BP Location: Left Arm, Patient Position: Sitting, Cuff Size: Normal)   Pulse 78   Temp 97.8 F (36.6 C) (Oral)   Ht  (1.956 m)   Wt 226 lb 3.2 oz (102.6 kg)   SpO2 98%   BMI 26.82 kg/m  Body mass index is 26.82 kg/m.  Wt Readings from Last 3 Encounters:  04/17/16 226 lb 3.2 oz (102.6 kg)  12/13/15 226 lb 6.4 oz (102.7 kg)  10/06/15 229 lb (103.9 kg)   Constitutional: normal weight, in NAD Eyes: PERRLA, EOMI, no exophthalmos ENT: moist  mucous membranes, no thyromegaly, no cervical lymphadenopathy Cardiovascular: RRR, No MRG Respiratory: CTA B Gastrointestinal: abdomen soft, NT, ND, BS+ Musculoskeletal: no deformities, strength intact in all 4 Skin: moist, warm, no rashes  ASSESSMENT: 1. DM2, insulin-dependent, controlled, with complications  - PN  - in 08/2015, we discussed about the CANVAS study with Invokana and the increased risk in amputations - most in men, HbA1c >8, PN or PVD.  PLAN:  1. Patient with well controlled diabetes, on basal insulin (will switch to Basaglar when he finishes Lantus) + maximal metformin dose + Farxiga (changed from Invokana at last visit, per insurance preference). Sugars are again at goal, unchanged from last visit. - I suggested to: Patient Instructions  Please continue: - Metformin 1000 mg 2x a day with meals - Farxiga 10 mg in am - Lantus/Basaglar 12 units at bedtime  Please return in 4 months with your sugar log.   - continue checking sugars at different times of the day - check 1-2 times a day, rotating checks  - he is UTD with annual eye exams - [facility] - UTD with flu shot - new HbA1c >> 6.0% (stable, excellent) - Return to clinic in 4 mo with sugar log   [name], MD PhD [facility]

## 2016-04-17 NOTE — Patient Instructions (Addendum)
Please continue: - Metformin 1000 mg 2x a day with meals - Farxiga 10 mg in am - Lantus/Basaglar 12 units at bedtime  Please return in 4 months with your sugar log.

## 2016-05-30 ENCOUNTER — Encounter: Payer: Self-pay | Admitting: Internal Medicine

## 2016-05-30 ENCOUNTER — Other Ambulatory Visit: Payer: Self-pay | Admitting: Internal Medicine

## 2016-06-02 ENCOUNTER — Other Ambulatory Visit: Payer: Self-pay | Admitting: Internal Medicine

## 2016-08-07 ENCOUNTER — Encounter: Payer: Self-pay | Admitting: Internal Medicine

## 2016-08-07 ENCOUNTER — Other Ambulatory Visit: Payer: Self-pay | Admitting: Internal Medicine

## 2016-08-07 ENCOUNTER — Other Ambulatory Visit: Payer: Self-pay

## 2016-08-07 ENCOUNTER — Ambulatory Visit (INDEPENDENT_AMBULATORY_CARE_PROVIDER_SITE_OTHER): Payer: Managed Care, Other (non HMO) | Admitting: Internal Medicine

## 2016-08-07 VITALS — BP 130/72 | HR 70 | Wt 222.8 lb

## 2016-08-07 DIAGNOSIS — E1142 Type 2 diabetes mellitus with diabetic polyneuropathy: Secondary | ICD-10-CM

## 2016-08-07 LAB — POCT GLYCOSYLATED HEMOGLOBIN (HGB A1C): Hemoglobin A1C: 5.7

## 2016-08-07 MED ORDER — METFORMIN HCL 500 MG PO TABS: ORAL_TABLET | ORAL | 2 refills | Status: DC

## 2016-08-07 NOTE — Addendum Note (Signed)
Addended by: Emmaline KluverERRELL, SARAH E on: 08/07/2016 10:28 AM   Modules accepted: Orders

## 2016-08-07 NOTE — Patient Instructions (Addendum)
Please continue: - Metformin 1000 mg 2x a day with meals - Jardiance 25 mg in am - Basaglar 12 units at bedtime  Please return in 4 months with your sugar log.

## 2016-08-07 NOTE — Progress Notes (Signed)
Patient ID: Clifford EdmanRobert Steven Alvarez, male   DOB: 10/21/1955, 61 y.o.   MRN: [id number]  HPI: Clifford Alvarez is a 61 y.o.-year-old male, returning for f/u for DM2 dx 01/28/2013, insulin-dependent since dx, controlled, with complications (Peripheral neuropathy). Last visit was 4 mo ago.  Last hemoglobin A1c: Lab Results  Component Value Date   HGBA1C 6.0 04/17/2016   HGBA1C 6.0 12/13/2015   HGBA1C 5.9 08/12/2015   He is on: - Metformin 1000 mg 2x a day with meals - Jardiance 25 mg in am - Basaglar 12 units at bedtime  Pt checks his sugars 2x a day:: - am:  110-130 >> 115-135 >> 115-135 >> 110-130 - 2h after b'fast: n/c >> 146 >> 197 >> n/c  - before lunch: 135-158 (206) >> 102, 128 >> n/c - 2h after lunch: 104-176 >> n/c >> 126 >> n/c - before dinner: n/c >> n/c - 2h after dinner:  100-150s >> 100-150 >> 100-140 - bedtime:  120-174 (higher while in [place] >> n/c - nighttime: 119-139 >> 123-145, 170 >> n/c No lows. Lowest sugar was 100. He  has hypoglycemia awareness in the 70s. Highest sugar was 155  Meter: Bayer Contour >> One Child psychotherapistTouch Verio.  Pt's meals are: - Breakfast: bagel, oatmeal, granola, eggs - Lunch: meat + 2-3 vegetables; soup; salad; sandwich - Dinner: meat + 2-3 vegetables; pasta; [place]; seafood - Snacks: 1 a day >> nuts, popcorn, dark chocolate No regular exercise.  - No CKD, last BUN/creatinine:  Lab Results  Component Value Date   BUN 18 10/07/2015   CREATININE 0.95 10/07/2015   Lab Results  Component Value Date   GFRNONAA >89 04/13/2015   Lab Results  Component Value Date   MICRALBCREAT 0.6 04/13/2015   MICRALBCREAT 0.7 05/11/2014   MICRALBCREAT 0.3 03/12/2013  On Losartan.  - He has HL. Last set of lipids: Lab Results  Component Value Date   CHOL 123 10/07/2015   HDL 38.70 (L) 10/07/2015   LDLCALC 55 10/07/2015   LDLDIRECT 70.0 04/13/2015   TRIG 145.0 10/07/2015   CHOLHDL 3 10/07/2015  On Lipitor. - last eye exam was in 03/2016 [facility]. No DR. Has glaucoma >> controlled. No blurry vision. Dr. Lottie DawsonBond - he denies numbness and tingling in his feet.He has L foot neuropathy.  [place] went [place] and also went to a ranch near [place], with his family.  ROS: Constitutional: no weight gain/no weight loss, no fatigue, no subjective hyperthermia, no subjective hypothermia Eyes: no blurry vision, no xerophthalmia ENT: no sore throat, no nodules palpated in throat, no dysphagia, no odynophagia, no hoarseness Cardiovascular: no CP/no SOB/no palpitations/no leg swelling Respiratory: no cough/no SOB/no wheezing Gastrointestinal: no N/no V/no D/no C/no acid reflux Musculoskeletal: no muscle aches/no joint aches Skin: no rashes, no hair loss Neurological: no tremors/no numbness/no tingling/no dizziness  I reviewed pt's medications, allergies, PMH, social hx, family hx, and changes were documented in the history of present illness. Otherwise, unchanged from my initial visit note.  PE: BP 130/72   Pulse 70   Wt 222 lb 12.8 oz (101.1 kg)   SpO2 97%   BMI 26.42 kg/m  Body mass index is 26.42 kg/m.  Wt Readings from Last 3 Encounters:  08/07/16 222 lb 12.8 oz (101.1 kg)  04/17/16 226 lb 3.2 oz (102.6 kg)  12/13/15 226 lb 6.4 oz (102.7 kg)   Constitutional: normal weight, in NAD Eyes: PERRLA, EOMI, no exophthalmos ENT: moist mucous membranes, no thyromegaly, no cervical lymphadenopathy Cardiovascular:  RRR, No MRG Respiratory: CTA B Gastrointestinal: abdomen soft, NT, ND, BS+ Musculoskeletal: no deformities, strength intact in all 4 Skin: moist, warm, no rashes Neurological: no tremor with outstretched hands, DTR normal in all 4  ASSESSMENT: 1. DM2, insulin-dependent, controlled, with complications  - PN - stable  - in 08/2015, we discussed about the CANVAS study with Invokana and the increased risk in amputations - most in men, HbA1c >8, PN or PVD.  2. PN 2/2 diabetes  PLAN:  1. Patient with well controlled diabetes, on  basal insulin, Maximal metformin dose and SGLT2 inhibitor. His sugars are at goal despite his trips to [place] and [place], where he mentions  that he stayed very active. He has no complaints at this visit.  - He inquires whether his insulin doses could be decreased, and we discussed that we would start doing so if his sugars decreased under 100 consistently. He will let me know.  - I suggested to: Patient Instructions  Please continue: - Metformin 1000 mg 2x a day with meals - Jardiance 25 mg in am - Basaglar 12 units at bedtime  Please return in 4 months with your sugar log.   - today, HbA1c is 5.7% (improved further) - continue checking sugars at different times of the day - check 1x a day, rotating checks - advised for yearly eye exams >> he is UTD [facility]) - Return to clinic in 4 mo with sugar log   2. PN 2/2 DM  - this is controlled, he has no new complaints  [name], MD PhD [facility]

## 2016-08-10 ENCOUNTER — Encounter: Payer: Self-pay | Admitting: Internal Medicine

## 2016-08-11 ENCOUNTER — Other Ambulatory Visit: Payer: Self-pay | Admitting: Internal Medicine

## 2016-08-11 MED ORDER — BASAGLAR KWIKPEN 100 UNIT/ML ~~LOC~~ SOPN: 12.0000 [IU] | PEN_INJECTOR | Freq: Every day | SUBCUTANEOUS | 0 refills | Status: DC

## 2016-08-13 ENCOUNTER — Encounter: Payer: Self-pay | Admitting: Internal Medicine

## 2016-09-03 ENCOUNTER — Other Ambulatory Visit: Payer: Self-pay | Admitting: Internal Medicine

## 2016-09-24 ENCOUNTER — Other Ambulatory Visit: Payer: Self-pay | Admitting: Internal Medicine

## 2016-09-27 ENCOUNTER — Encounter: Payer: Self-pay | Admitting: Internal Medicine

## 2016-10-06 ENCOUNTER — Other Ambulatory Visit: Payer: Self-pay

## 2016-10-06 MED ORDER — ATORVASTATIN CALCIUM 20 MG PO TABS: ORAL_TABLET | ORAL | 0 refills | Status: DC

## 2016-10-17 ENCOUNTER — Other Ambulatory Visit: Payer: Self-pay | Admitting: Internal Medicine

## 2016-10-23 ENCOUNTER — Ambulatory Visit (INDEPENDENT_AMBULATORY_CARE_PROVIDER_SITE_OTHER): Payer: Managed Care, Other (non HMO) | Admitting: Internal Medicine

## 2016-10-23 ENCOUNTER — Encounter: Payer: Self-pay | Admitting: Internal Medicine

## 2016-10-23 VITALS — BP 118/72 | HR 70 | Temp 98.5°F | Ht 77.0 in | Wt 221.0 lb

## 2016-10-23 DIAGNOSIS — Z0000 Encounter for general adult medical examination without abnormal findings: Secondary | ICD-10-CM | POA: Diagnosis not present

## 2016-10-23 DIAGNOSIS — Z1211 Encounter for screening for malignant neoplasm of colon: Secondary | ICD-10-CM

## 2016-10-23 DIAGNOSIS — G588 Other specified mononeuropathies: Secondary | ICD-10-CM | POA: Diagnosis not present

## 2016-10-23 DIAGNOSIS — E1142 Type 2 diabetes mellitus with diabetic polyneuropathy: Secondary | ICD-10-CM | POA: Diagnosis not present

## 2016-10-23 DIAGNOSIS — Z23 Encounter for immunization: Secondary | ICD-10-CM | POA: Diagnosis not present

## 2016-10-23 MED ORDER — GABAPENTIN 100 MG PO CAPS: 100.0000 mg | ORAL_CAPSULE | Freq: Three times a day (TID) | ORAL | 3 refills | Status: DC

## 2016-10-23 MED ORDER — ATORVASTATIN CALCIUM 20 MG PO TABS: ORAL_TABLET | ORAL | 3 refills | Status: DC

## 2016-10-23 MED ORDER — LOSARTAN POTASSIUM 25 MG PO TABS: 25.0000 mg | ORAL_TABLET | Freq: Every day | ORAL | 3 refills | Status: DC

## 2016-10-23 NOTE — Assessment & Plan Note (Signed)
Pt reduced insulin Dr Elvera LennoxGherghe

## 2016-10-23 NOTE — Assessment & Plan Note (Signed)
Will try Gabapentin low dose

## 2016-10-23 NOTE — Assessment & Plan Note (Signed)
We discussed age appropriate health related issues, including available/recomended screening tests and vaccinations. We discussed a need for adhering to healthy diet and exercise. Labs were ordered to be later reviewed . All questions were answered.  Colon w/Dr Medoff is due

## 2016-10-23 NOTE — Addendum Note (Signed)
Addended by: Scarlett PrestoFRIEDENBACH, Oniel Meleski on: 10/23/2016 01:59 PM   Modules accepted: Orders

## 2016-10-23 NOTE — Patient Instructions (Signed)
Shingrix

## 2016-10-23 NOTE — Progress Notes (Signed)
Subjective:  Patient ID: Clifford Alvarez, male    DOB: [birth year]  Age: 61 y.o. MRN: [id number]  CC: No chief complaint on file.   HPI [name] presents for a well exam C/o tingling in the feet, hands early in am's  Outpatient Medications Prior to Visit  Medication Sig Dispense Refill  . atorvastatin (LIPITOR) 20 MG tablet TAKE 1 TABLET (20 MG TOTAL) BY MOUTH DAILY. Keep appointment for further refills 90 tablet 0  . b complex vitamins tablet Take 1 tablet by mouth daily. 100 tablet 3  . BD PEN NEEDLE NANO U/F 32G X 4 MM MISC USE 1 EACH DAY AT BEDTIME 100 each 1  . Blood Glucose Monitoring Suppl (ONETOUCH VERIO) W/DEVICE KIT 1 each by Does not apply route daily. 1 kit 0  . Cholecalciferol (VITAMIN D3) 2000 units capsule Take 1 capsule (2,000 Units total) by mouth daily. 100 capsule 3  . dapagliflozin propanediol (FARXIGA) 10 MG TABS tablet Take 10 mg by mouth daily. 90 tablet 0  . dorzolamide-timolol (COSOPT) 22.3-6.8 MG/ML ophthalmic solution Place 1 drop into both eyes 2 (two) times daily.    Marland Kitchen FARXIGA 10 MG TABS tablet TAKE 10 MG BY MOUTH DAILY. 90 tablet 0  . Insulin Glargine (BASAGLAR KWIKPEN) 100 UNIT/ML SOPN Inject 0.12 mLs (12 Units total) into the skin at bedtime. 5 pen 0  . latanoprost (XALATAN) 0.005 % ophthalmic solution Place 1 drop into both eyes at bedtime.     Marland Kitchen losartan (COZAAR) 25 MG tablet Take 1 tablet (25 mg total) by mouth daily. Patient needs office visit before refills will be given 90 tablet 0  . metFORMIN (GLUCOPHAGE) 500 MG tablet TAKE 2 TABLETS BY MOUTH TWICE A DAY WITH A MEAL 360 tablet 2  . ONETOUCH DELICA LANCETS FINE MISC USE TO TEST BLOOD SUGAR 3 TIMES DAILY AS INSTRUCTED. 100 each 6  . ONETOUCH VERIO test strip USE TO TEST BLOOD SUGAR 3 TIMES DAILY AS INSTRUCTED 100 each 2   Facility-Administered Medications Prior to Visit  Medication Dose Route Frequency Provider Last Rate Last Dose  . ketoconazole (NIZORAL) 2 % cream   Topical BID  Djon Tith, Evie Lacks, MD        ROS Review of Systems  Constitutional: Negative for appetite change, fatigue and unexpected weight change.  HENT: Negative for congestion, nosebleeds, sneezing, sore throat and trouble swallowing.   Eyes: Negative for itching and visual disturbance.  Respiratory: Negative for cough.   Cardiovascular: Negative for chest pain, palpitations and leg swelling.  Gastrointestinal: Negative for abdominal distention, blood in stool, diarrhea and nausea.  Genitourinary: Negative for frequency and hematuria.  Musculoskeletal: Negative for back pain, gait problem, joint swelling and neck pain.  Skin: Negative for rash.  Neurological: Negative for dizziness, tremors, speech difficulty and weakness.  Psychiatric/Behavioral: Negative for agitation, dysphoric mood and sleep disturbance. The patient is not nervous/anxious.     Objective:  BP 118/72 (BP Location: Left Arm, Patient Position: Sitting, Cuff Size: Large)   Pulse 70   Temp 98.5 F (36.9 C) (Oral)   Ht 6' 5"  (1.956 m)   Wt 221 lb (100.2 kg)   SpO2 99%   BMI 26.21 kg/m   BP Readings from Last 3 Encounters:  10/23/16 118/72  08/07/16 130/72  04/17/16 (!) 146/72    Wt Readings from Last 3 Encounters:  10/23/16 221 lb (100.2 kg)  08/07/16 222 lb 12.8 oz (101.1 kg)  04/17/16 226 lb 3.2 oz (102.6 kg)  Physical Exam  Constitutional: He is oriented to person, place, and time. He appears well-developed. No distress.  NAD  HENT:  Mouth/Throat: Oropharynx is clear and moist.  Eyes: Pupils are equal, round, and reactive to light. Conjunctivae are normal.  Neck: Normal range of motion. No JVD present. No thyromegaly present.  Cardiovascular: Normal rate, regular rhythm, normal heart sounds and intact distal pulses.  Exam reveals no gallop and no friction rub.   No murmur heard. Pulmonary/Chest: Effort normal and breath sounds normal. No respiratory distress. He has no wheezes. He has no rales. He  exhibits no tenderness.  Abdominal: Soft. Bowel sounds are normal. He exhibits no distension and no mass. There is no tenderness. There is no rebound and no guarding.  Genitourinary: Rectum normal and prostate normal. Rectal exam shows guaiac negative stool.  Musculoskeletal: Normal range of motion. He exhibits no edema or tenderness.  Lymphadenopathy:    He has no cervical adenopathy.  Neurological: He is alert and oriented to person, place, and time. He has normal reflexes. No cranial nerve deficit. He exhibits normal muscle tone. He displays a negative Romberg sign. Coordination and gait normal.  Skin: Skin is warm and dry. No rash noted.  Psychiatric: He has a normal mood and affect. His behavior is normal. Judgment and thought content normal.   Procedure: EKG Indication: well exam Impression: NSR.    Lab Results  Component Value Date   WBC 8.3 10/07/2015   HGB 15.3 10/07/2015   HCT 45.8 10/07/2015   PLT 180.0 10/07/2015   GLUCOSE 110 (H) 10/07/2015   CHOL 123 10/07/2015   TRIG 145.0 10/07/2015   HDL 38.70 (L) 10/07/2015   LDLDIRECT 70.0 04/13/2015   LDLCALC 55 10/07/2015   ALT 16 10/07/2015   AST 16 10/07/2015   NA 140 10/07/2015   K 4.2 10/07/2015   CL 104 10/07/2015   CREATININE 0.95 10/07/2015   BUN 18 10/07/2015   CO2 28 10/07/2015   TSH 4.08 10/07/2015   PSA 0.63 10/07/2015   HGBA1C 5.7 08/07/2016   MICROALBUR <0.7 04/13/2015    Patient was never admitted.  Assessment & Plan:   There are no diagnoses linked to this encounter. I am having Mr. Reeder maintain his latanoprost, dorzolamide-timolol, ONETOUCH VERIO, b complex vitamins, Vitamin D3, ONETOUCH DELICA LANCETS FINE, dapagliflozin propanediol, BD PEN NEEDLE NANO U/F, metFORMIN, BASAGLAR KWIKPEN, FARXIGA, losartan, atorvastatin, and ONETOUCH VERIO. We will continue to administer ketoconazole.  No orders of the defined types were placed in this encounter.    Follow-up: No Follow-up on file.  Walker Kehr, MD

## 2016-10-26 ENCOUNTER — Other Ambulatory Visit (INDEPENDENT_AMBULATORY_CARE_PROVIDER_SITE_OTHER): Payer: Managed Care, Other (non HMO)

## 2016-10-26 DIAGNOSIS — E1142 Type 2 diabetes mellitus with diabetic polyneuropathy: Secondary | ICD-10-CM

## 2016-10-26 DIAGNOSIS — Z0000 Encounter for general adult medical examination without abnormal findings: Secondary | ICD-10-CM

## 2016-10-26 LAB — LIPID PANEL
CHOLESTEROL: 124 mg/dL (ref 0–200)
HDL: 33 mg/dL — AB (ref >39.00)
LDL Cholesterol: 58 mg/dL (ref 0–99)
NonHDL: 90.97
TRIGLYCERIDES: 163 mg/dL — AB (ref 0.0–149.0)
Total CHOL/HDL Ratio: 4
VLDL: 32.6 mg/dL (ref 0.0–40.0)

## 2016-10-26 LAB — CBC WITH DIFFERENTIAL/PLATELET
BASOS ABS: 0 10*3/uL (ref 0.0–0.1)
BASOS PCT: 0.4 % (ref 0.0–3.0)
Eosinophils Absolute: 0.3 10*3/uL (ref 0.0–0.7)
Eosinophils Relative: 4.9 % (ref 0.0–5.0)
HEMATOCRIT: 47 % (ref 39.0–52.0)
Hemoglobin: 15.4 g/dL (ref 13.0–17.0)
LYMPHS PCT: 30.2 % (ref 12.0–46.0)
Lymphs Abs: 1.7 10*3/uL (ref 0.7–4.0)
MCHC: 32.9 g/dL (ref 30.0–36.0)
MCV: 92.5 fl (ref 78.0–100.0)
MONOS PCT: 12.1 % — AB (ref 3.0–12.0)
Monocytes Absolute: 0.7 10*3/uL (ref 0.1–1.0)
NEUTROS ABS: 2.9 10*3/uL (ref 1.4–7.7)
NEUTROS PCT: 52.4 % (ref 43.0–77.0)
PLATELETS: 163 10*3/uL (ref 150.0–400.0)
RBC: 5.08 Mil/uL (ref 4.22–5.81)
RDW: 13.4 % (ref 11.5–15.5)
WBC: 5.6 10*3/uL (ref 4.0–10.5)

## 2016-10-26 LAB — HEPATIC FUNCTION PANEL
ALBUMIN: 4.4 g/dL (ref 3.5–5.2)
ALK PHOS: 84 U/L (ref 39–117)
ALT: 16 U/L (ref 0–53)
AST: 15 U/L (ref 0–37)
Bilirubin, Direct: 0.2 mg/dL (ref 0.0–0.3)
TOTAL PROTEIN: 6.7 g/dL (ref 6.0–8.3)
Total Bilirubin: 0.8 mg/dL (ref 0.2–1.2)

## 2016-10-26 LAB — BASIC METABOLIC PANEL
BUN: 20 mg/dL (ref 6–23)
CALCIUM: 9.3 mg/dL (ref 8.4–10.5)
CO2: 29 meq/L (ref 19–32)
CREATININE: 0.96 mg/dL (ref 0.40–1.50)
Chloride: 104 mEq/L (ref 96–112)
GFR: 84.21 mL/min (ref >60.00)
GLUCOSE: 141 mg/dL — AB (ref 70–99)
Potassium: 4.5 mEq/L (ref 3.5–5.1)
SODIUM: 140 meq/L (ref 135–145)

## 2016-10-26 LAB — URINALYSIS
Bilirubin Urine: NEGATIVE
Hgb urine dipstick: NEGATIVE
KETONES UR: NEGATIVE
Leukocytes, UA: NEGATIVE
Nitrite: NEGATIVE
PH: 5.5 (ref 5.0–8.0)
SPECIFIC GRAVITY, URINE: 1.02 (ref 1.000–1.030)
Total Protein, Urine: NEGATIVE
Urobilinogen, UA: 0.2 (ref 0.0–1.0)

## 2016-10-26 LAB — TSH: TSH: 3.9 u[IU]/mL (ref 0.35–4.50)

## 2016-10-26 LAB — MICROALBUMIN / CREATININE URINE RATIO
CREATININE, U: 85.2 mg/dL
Microalb Creat Ratio: 0.8 mg/g (ref 0.0–30.0)
Microalb, Ur: <0.7 mg/dL (ref 0.0–1.9)

## 2016-10-26 LAB — PSA: PSA: 0.58 ng/mL (ref 0.10–4.00)

## 2016-10-26 LAB — HEMOGLOBIN A1C: HEMOGLOBIN A1C: 6.3 % (ref 4.6–6.5)

## 2016-10-27 ENCOUNTER — Other Ambulatory Visit: Payer: Self-pay | Admitting: Internal Medicine

## 2016-12-28 ENCOUNTER — Other Ambulatory Visit: Payer: Self-pay | Admitting: Internal Medicine

## 2016-12-29 DIAGNOSIS — H401112 Primary open-angle glaucoma, right eye, moderate stage: Secondary | ICD-10-CM | POA: Insufficient documentation

## 2017-01-08 ENCOUNTER — Ambulatory Visit: Payer: Managed Care, Other (non HMO) | Admitting: Endocrinology

## 2017-01-08 ENCOUNTER — Ambulatory Visit (INDEPENDENT_AMBULATORY_CARE_PROVIDER_SITE_OTHER): Payer: Managed Care, Other (non HMO) | Admitting: Internal Medicine

## 2017-01-08 ENCOUNTER — Encounter: Payer: Self-pay | Admitting: Internal Medicine

## 2017-01-08 VITALS — BP 104/78 | HR 65 | Ht 77.0 in | Wt 224.8 lb

## 2017-01-08 DIAGNOSIS — G63 Polyneuropathy in diseases classified elsewhere: Secondary | ICD-10-CM

## 2017-01-08 DIAGNOSIS — E1142 Type 2 diabetes mellitus with diabetic polyneuropathy: Secondary | ICD-10-CM

## 2017-01-08 DIAGNOSIS — E785 Hyperlipidemia, unspecified: Secondary | ICD-10-CM

## 2017-01-08 LAB — POCT GLYCOSYLATED HEMOGLOBIN (HGB A1C): Hemoglobin A1C: 6

## 2017-01-08 NOTE — Patient Instructions (Addendum)
Please continue: - Metformin 1000 mg 2x a day with meals - Jardiance 25 mg in am  Please start: - alpha-lipoic acid 600 mg 2x a day  Please return in 4 months with your sugar log.

## 2017-01-08 NOTE — Addendum Note (Signed)
Addended by: Yolande JollyLAWSON, Breigh Annett on: 01/08/2017 11:52 AM   Modules accepted: Orders

## 2017-01-08 NOTE — Progress Notes (Signed)
Patient ID: Clifford Alvarez, male   DOB: 12/27/1955, 62 y.o.   MRN: [id number]  HPI: Clifford Alvarez is a 62 y.o.-year-old male, returning for f/u for DM2 dx 01/28/2013, insulin-dependent since dx, controlled, with complications (Peripheral neuropathy). Last visit was 5 mo ago.  Last hemoglobin A1c: Lab Results  Component Value Date   HGBA1C 6.3 10/26/2016   HGBA1C 5.7 08/07/2016   HGBA1C 6.0 04/17/2016   He is on: - Metformin 1000 mg 2x a day with meals - Jardiance 25 mg in am - >> stopped 08/2016 >> restarted 2 mo ago: 6-8 units 3x a day  Pt checks his sugars 2x a day: - am:  110-130 >> 115-135 >> 115-135 >> 110-130 >> 107-145 - 2h after b'fast: n/c >> 146 >> 197 >> n/c  - before lunch: 135-158 (206) >> 102, 128 >> n/c - 2h after lunch: 104-176 >> n/c >> 126 >> n/c - before dinner: n/c >> n/c - 2h after dinner:  100-150s >> 100-150 >> 100-140 >> 110-140s - bedtime:  120-174 (higher while in [place]) >> n/c - nighttime: 119-139 >> 123-145, 170 >> n/c Lowest sugar was 100 >> 105. He  has hypoglycemia awareness in the 70s. Highest sugar was 155 >> 155.  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  Pt's meals are: - Breakfast: bagel, oatmeal, granola, eggs - Lunch: meat + 2-3 vegetables; soup; salad; sandwich - Dinner: meat + 2-3 vegetables; pasta; [place]; seafood - Snacks: 1 a day >> nuts, popcorn, dark chocolate No regular exercise.  - no CKD, last BUN/creatinine:  Lab Results  Component Value Date   BUN 20 10/26/2016   CREATININE 0.96 10/26/2016   Lab Results  Component Value Date   GFRNONAA >89 04/13/2015   Lab Results  Component Value Date   MICRALBCREAT 0.8 10/26/2016   MICRALBCREAT 0.6 04/13/2015   MICRALBCREAT 0.7 05/11/2014   MICRALBCREAT 0.3 03/12/2013  On losartan.  - + HL. Last set of lipids: Lab Results  Component Value Date   CHOL 124 10/26/2016   HDL 33.00 (L) 10/26/2016   LDLCALC 58 10/26/2016   LDLDIRECT 70.0 04/13/2015   TRIG 163.0  (H) 10/26/2016   CHOLHDL 4 10/26/2016  On Lipitor. - last eye exam was in 12/2016 [facility]. No DR. + glaucoma >> controlled.  No blurry vision. Dr. Lottie Dawson. - + numbness and tingling in his feet.   ROS: Constitutional: no weight gain/no weight loss, no fatigue, no subjective hyperthermia, no subjective hypothermia Eyes: no blurry vision, no xerophthalmia ENT: no sore throat, no nodules palpated in throat, no dysphagia, no odynophagia, no hoarseness Cardiovascular: no CP/no SOB/no palpitations/no leg swelling Respiratory: no cough/no SOB/no wheezing Gastrointestinal: no N/no V/no D/no C/no acid reflux Musculoskeletal: no muscle aches/no joint aches Skin: no rashes, no hair loss Neurological: no tremors/no numbness/no tingling/no dizziness  I reviewed pt's medications, allergies, PMH, social hx, family hx, and changes were documented in the history of present illness. Otherwise, unchanged from my initial visit note.  PE: BP 104/78   Pulse 65   Ht 6\' 5"  (1.956 m)   Wt 224 lb 12.8 oz (102 kg)   SpO2 95%   BMI 26.66 kg/m  Body mass index is 26.66 kg/m.  Wt Readings from Last 3 Encounters:  01/08/17 224 lb 12.8 oz (102 kg)  10/23/16 221 lb (100.2 kg)  08/07/16 222 lb 12.8 oz (101.1 kg)   Constitutional: slightly overweight, in NAD Eyes: PERRLA, EOMI, no exophthalmos ENT: moist mucous membranes,  no thyromegaly, no cervical lymphadenopathy Cardiovascular: RRR, No MRG Respiratory: CTA B Gastrointestinal: abdomen soft, NT, ND, BS+ Musculoskeletal: no deformities, strength intact in all 4 Skin: moist, warm, no rashes Neurological: no tremor with outstretched hands, DTR normal in all 4   ASSESSMENT: 1. DM2, insulin-dependent, controlled, with complications  - PN - stable  - in 08/2015, we discussed about the CANVAS study with [employer] and the increased risk in amputations - most in men, HbA1c >8, PN or PVD.  2. PN 2/2 diabetes  3. HL  PLAN:  1. Patient with  well-controlled diabetes, on metformin and SGL T-2 inhibitor.  We as his HbA1c length, at 5.7%.  He had another HbA1c since then which was higher, at 6.3%, but still at target. - at this visit, sugars are still at goal, except slightly higher occasionally in am. He is using the insulin at lower doses and only occasionally >> discussed that he can stop completely, or only use it in days when he is less active and eating more - I suggested to: Patient Instructions  Please continue: - Metformin 1000 mg 2x a day with meals - Jardiance 25 mg in am  Please start: - alpha-lipoic acid 600 mg 2x a day  Please return in 4 months with your sugar log.    - today, HbA1c is 6.0% (better) - continue checking sugars at different times of the day - check 1x a day, rotating checks - advised for yearly eye exams >> he is UTD - Return to clinic in 4 mo with sugar log    2. PN 2/2 DM  - more numbness - will start ALA (see pt instr.) - continue B complex  3. HL - reviewed his lipid panel from 10/2016: LDL improved, triglycerides only slightly high - Continues on Lipitor without side effects  Clifford Pavlovristina Anvi Mangal, MD PhD [facility]

## 2017-02-26 ENCOUNTER — Other Ambulatory Visit: Payer: Self-pay | Admitting: Internal Medicine

## 2017-02-27 NOTE — Telephone Encounter (Signed)
Is this okay to refill? Per last office note the pt was not taking this medication

## 2017-02-27 NOTE — Telephone Encounter (Signed)
He appears to be on Jardiance instead. If insurance [place], we can change.

## 2017-02-27 NOTE — Telephone Encounter (Signed)
Pt stated that he is not sure which insurance covers but would like to stay on farixga

## 2017-05-06 ENCOUNTER — Other Ambulatory Visit: Payer: Self-pay | Admitting: Internal Medicine

## 2017-05-14 ENCOUNTER — Encounter: Payer: Self-pay | Admitting: Internal Medicine

## 2017-05-14 ENCOUNTER — Ambulatory Visit (INDEPENDENT_AMBULATORY_CARE_PROVIDER_SITE_OTHER): Payer: Managed Care, Other (non HMO) | Admitting: Internal Medicine

## 2017-05-14 VITALS — BP 108/62 | HR 64 | Ht 77.0 in | Wt 220.4 lb

## 2017-05-14 DIAGNOSIS — G63 Polyneuropathy in diseases classified elsewhere: Secondary | ICD-10-CM | POA: Diagnosis not present

## 2017-05-14 DIAGNOSIS — E1142 Type 2 diabetes mellitus with diabetic polyneuropathy: Secondary | ICD-10-CM | POA: Diagnosis not present

## 2017-05-14 DIAGNOSIS — E785 Hyperlipidemia, unspecified: Secondary | ICD-10-CM

## 2017-05-14 LAB — POCT GLYCOSYLATED HEMOGLOBIN (HGB A1C): HEMOGLOBIN A1C: 6

## 2017-05-14 NOTE — Patient Instructions (Addendum)
Please continue: - Metformin 1000 mg 2x a day with meals - Farxiga 10 mg in am  Continue: - alpha-lipoic acid 600 mg 2x a day  Please return in 6 months with your sugar log.

## 2017-05-14 NOTE — Progress Notes (Signed)
Patient ID: Clifford Alvarez, male   DOB: February 18, 1955, 62 y.o.   MRN: [id number]  HPI: Clifford Alvarez is a 62 y.o.-year-old male, returning for f/u for DM2 dx 01/28/2013, insulin-dependent since dx, controlled, with complications (Peripheral neuropathy). Last visit was 4 months ago.  Last hemoglobin A1c: Lab Results  Component Value Date   HGBA1C 6 01/08/2017   HGBA1C 6.3 10/26/2016   HGBA1C 5.7 08/07/2016   He is on: - Metformin 1000 mg 2x a day with meals - Jardiance 25 mg in am >> Farxiga 10 mg in am He was previously on Basaglar 12 units at bedtime >> stopped 08/2016 >> restarted: 6-8 units 3x a day >> now off completely  Pt checks his sugars 2x a day: - am:   115-135 >> 110-130 >> 107-145 >> 110s, 175 - 2h after b'fast: 146 >> 197 >> n/c  - before lunch:  102, 128 >> n/c - 2h after lunch: 1n/c >> 126 >> n/c - before dinner: n/c >> n/c - 2h after dinner:  100-140 >> 110-140s >> 130-140 - bedtime:  120-174 >> n/c - nighttime:  123-145, 170 >> n/c Lowest sugar was 100 >> 105 >> 110. He  has hypoglycemia awareness in the 70s. Highest sugar was 155 >> 155 >> 175 - 2-3x.  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  Pt's meals are: - Breakfast: bagel, oatmeal, granola, eggs - Lunch: meat + 2-3 vegetables; soup; salad; sandwich - Dinner: meat + 2-3 vegetables; pasta; [place]; seafood - Snacks: 1 a day >> nuts, popcorn, dark chocolate No regular exercise.  - No CKD, last BUN/creatinine:  Lab Results  Component Value Date   BUN 20 10/26/2016   CREATININE 0.96 10/26/2016   Lab Results  Component Value Date   GFRNONAA >89 04/13/2015   Lab Results  Component Value Date   MICRALBCREAT 0.8 10/26/2016   MICRALBCREAT 0.6 04/13/2015   MICRALBCREAT 0.7 05/11/2014   MICRALBCREAT 0.3 03/12/2013  On losartan.  - + HL. Last set of lipids: Lab Results  Component Value Date   CHOL 124 10/26/2016   HDL 33.00 (L) 10/26/2016   LDLCALC 58 10/26/2016   LDLDIRECT 70.0  04/13/2015   TRIG 163.0 (H) 10/26/2016   CHOLHDL 4 10/26/2016  On Lipitor. - last eye exam was in 12/2016 at [facility]: No DR, + controlled glaucoma. Dr. Lottie Dawson.  No complaints of blurry vision. - + numbness and tingling in his feet.   ROS: Constitutional: no weight gain/no weight loss, no fatigue, no subjective hyperthermia, no subjective hypothermia Eyes: no blurry vision, no xerophthalmia ENT: no sore throat, no nodules palpated in throat, no dysphagia, no odynophagia, no hoarseness Cardiovascular: no CP/no SOB/no palpitations/no leg swelling Respiratory: no cough/no SOB/no wheezing Gastrointestinal: no N/no V/no D/no C/no acid reflux Musculoskeletal: no muscle aches/no joint aches Skin: no rashes, no hair loss Neurological: no tremors/no numbness/no tingling/no dizziness  I reviewed pt's medications, allergies, PMH, social hx, family hx, and changes were documented in the history of present illness. Otherwise, unchanged from my initial visit note.  PE: BP 108/62   Pulse 64   Ht  (1.956 m)   Wt 220 lb 6.4 oz (100 kg)   SpO2 96%   BMI 26.14 kg/m  Body mass index is 26.14 kg/m.  Wt Readings from Last 3 Encounters:  05/14/17 220 lb 6.4 oz (100 kg)  01/08/17 224 lb 12.8 oz (102 kg)  10/23/16 221 lb (100.2 kg)   Constitutional: overweight, in NAD Eyes:  PERRLA, EOMI, no exophthalmos ENT: moist mucous membranes, no thyromegaly, no cervical lymphadenopathy Cardiovascular: RRR, No MRG Respiratory: CTA B Gastrointestinal: abdomen soft, NT, ND, BS+ Musculoskeletal: no deformities, strength intact in all 4 Skin: moist, warm, no rashes Neurological: no tremor with outstretched hands, DTR normal in all 4   ASSESSMENT: 1. DM2, insulin-dependent, controlled, with complications  - PN - stable  - in 08/2015, we discussed about the CANVAS study with [employer] and the increased risk in amputations - most in men, HbA1c >8, PN or PVD.  2. PN 2/2 diabetes  3. HL  PLAN:  1.  Patient with well-controlled diabetes, on oral medication (metformin and SGLT2 inhibitor).  At last visit, sugars were still at goal, except slightly higher occasionally in the morning.  He was using insulin at lower doses and only sparsely, so we discussed that he can stop completely or only use it in days when he is less active and eating more.  He has not used insulin since last visit. - His sugars are still at goal in the morning and after dinner, the times when he normally checks, with very few spikes -Therefore, no need to change his regimen indefinitely no need to restart his insulin - I suggested to: Patient Instructions  Please continue: - Metformin 1000 mg 2x a day with meals - Farxiga 10 mg in am  Continue: - alpha-lipoic acid 600 mg 2x a day  Please return in 6 months with your sugar log.    - today, HbA1c is 6% (stable, great!) - continue checking sugars at different times of the day - check 1x a day, rotating checks - advised for yearly eye exams >> he is UTD - Return to clinic in 6 mo with sugar log    2. PN 2/2 DM  -He had more numbness at last visit, which persists -We started alpha lipoic acid at last visit >> feels that this is helping some -Continue B complex  3. HL - Reviewed latest lipid panel from 10/2016: LDL improved, triglycerides only slightly high - Continues the statin (Lipitor) without side effects.  [name], MD PhD [facility]

## 2017-05-26 ENCOUNTER — Other Ambulatory Visit: Payer: Self-pay | Admitting: Internal Medicine

## 2017-10-09 ENCOUNTER — Other Ambulatory Visit: Payer: Self-pay | Admitting: Internal Medicine

## 2017-10-24 ENCOUNTER — Other Ambulatory Visit: Payer: Self-pay | Admitting: Internal Medicine

## 2017-10-25 NOTE — Telephone Encounter (Signed)
sch OV 

## 2017-10-29 ENCOUNTER — Ambulatory Visit (INDEPENDENT_AMBULATORY_CARE_PROVIDER_SITE_OTHER): Payer: Managed Care, Other (non HMO) | Admitting: Internal Medicine

## 2017-10-29 ENCOUNTER — Other Ambulatory Visit (INDEPENDENT_AMBULATORY_CARE_PROVIDER_SITE_OTHER): Payer: Managed Care, Other (non HMO)

## 2017-10-29 ENCOUNTER — Encounter: Payer: Self-pay | Admitting: Internal Medicine

## 2017-10-29 VITALS — BP 110/72 | HR 64 | Temp 98.5°F | Ht 77.0 in | Wt 220.0 lb

## 2017-10-29 DIAGNOSIS — Z23 Encounter for immunization: Secondary | ICD-10-CM

## 2017-10-29 DIAGNOSIS — E1142 Type 2 diabetes mellitus with diabetic polyneuropathy: Secondary | ICD-10-CM

## 2017-10-29 DIAGNOSIS — E785 Hyperlipidemia, unspecified: Secondary | ICD-10-CM | POA: Diagnosis not present

## 2017-10-29 DIAGNOSIS — Z0000 Encounter for general adult medical examination without abnormal findings: Secondary | ICD-10-CM

## 2017-10-29 DIAGNOSIS — E559 Vitamin D deficiency, unspecified: Secondary | ICD-10-CM | POA: Diagnosis not present

## 2017-10-29 LAB — CBC WITH DIFFERENTIAL/PLATELET
BASOS PCT: 0.3 % (ref 0.0–3.0)
Basophils Absolute: 0 10*3/uL (ref 0.0–0.1)
EOS PCT: 2.7 % (ref 0.0–5.0)
Eosinophils Absolute: 0.2 10*3/uL (ref 0.0–0.7)
HCT: 45 % (ref 39.0–52.0)
Hemoglobin: 15.4 g/dL (ref 13.0–17.0)
Lymphocytes Relative: 19.3 % (ref 12.0–46.0)
Lymphs Abs: 1.6 10*3/uL (ref 0.7–4.0)
MCHC: 34.3 g/dL (ref 30.0–36.0)
MCV: 91.4 fl (ref 78.0–100.0)
MONO ABS: 0.7 10*3/uL (ref 0.1–1.0)
Monocytes Relative: 8.6 % (ref 3.0–12.0)
NEUTROS ABS: 5.6 10*3/uL (ref 1.4–7.7)
Neutrophils Relative %: 69.1 % (ref 43.0–77.0)
PLATELETS: 167 10*3/uL (ref 150.0–400.0)
RBC: 4.92 Mil/uL (ref 4.22–5.81)
RDW: 13.1 % (ref 11.5–15.5)
WBC: 8.2 10*3/uL (ref 4.0–10.5)

## 2017-10-29 LAB — BASIC METABOLIC PANEL
BUN: 17 mg/dL (ref 6–23)
CO2: 27 mEq/L (ref 19–32)
Calcium: 9.2 mg/dL (ref 8.4–10.5)
Chloride: 103 mEq/L (ref 96–112)
Creatinine, Ser: 0.98 mg/dL (ref 0.40–1.50)
GFR: 81.96 mL/min (ref >60.00)
Glucose, Bld: 137 mg/dL — ABNORMAL HIGH (ref 70–99)
POTASSIUM: 4.2 meq/L (ref 3.5–5.1)
SODIUM: 139 meq/L (ref 135–145)

## 2017-10-29 LAB — HEPATIC FUNCTION PANEL
ALBUMIN: 4.7 g/dL (ref 3.5–5.2)
ALT: 14 U/L (ref 0–53)
AST: 15 U/L (ref 0–37)
Alkaline Phosphatase: 83 U/L (ref 39–117)
BILIRUBIN DIRECT: 0.1 mg/dL (ref 0.0–0.3)
TOTAL PROTEIN: 7 g/dL (ref 6.0–8.3)
Total Bilirubin: 0.8 mg/dL (ref 0.2–1.2)

## 2017-10-29 LAB — LIPID PANEL
CHOLESTEROL: 134 mg/dL (ref 0–200)
HDL: 35.9 mg/dL — AB (ref >39.00)
LDL CALC: 65 mg/dL (ref 0–99)
NonHDL: 98.44
TRIGLYCERIDES: 169 mg/dL — AB (ref 0.0–149.0)
Total CHOL/HDL Ratio: 4
VLDL: 33.8 mg/dL (ref 0.0–40.0)

## 2017-10-29 LAB — URINALYSIS
Bilirubin Urine: NEGATIVE
HGB URINE DIPSTICK: NEGATIVE
Ketones, ur: NEGATIVE
Leukocytes, UA: NEGATIVE
Nitrite: NEGATIVE
PH: 5.5 (ref 5.0–8.0)
Specific Gravity, Urine: 1.02 (ref 1.000–1.030)
TOTAL PROTEIN, URINE-UPE24: NEGATIVE
Urobilinogen, UA: 0.2 (ref 0.0–1.0)

## 2017-10-29 LAB — TSH: TSH: 3.08 u[IU]/mL (ref 0.35–4.50)

## 2017-10-29 LAB — MICROALBUMIN / CREATININE URINE RATIO
Creatinine,U: 82 mg/dL
MICROALB UR: 1.3 mg/dL (ref 0.0–1.9)
Microalb Creat Ratio: 1.6 mg/g (ref 0.0–30.0)

## 2017-10-29 LAB — PSA: PSA: 1.57 ng/mL (ref 0.10–4.00)

## 2017-10-29 LAB — COLOGUARD: Cologuard: NEGATIVE

## 2017-10-29 NOTE — Progress Notes (Signed)
Subjective:  Patient ID: Clifford Alvarez, male    DOB: 1955-10-23  Age: 62 y.o. MRN: [id number]  CC: No chief complaint on file.   HPI [name] presents for a well exam  Outpatient Medications Prior to Visit  Medication Sig Dispense Refill  . atorvastatin (LIPITOR) 20 MG tablet TAKE 1 TABLET (20 MG TOTAL) BY MOUTH DAILY. KEEP APPOINTMENT FOR FURTHER REFILLS 90 tablet 0  . b complex vitamins tablet Take 1 tablet by mouth daily. 100 tablet 3  . Blood Glucose Monitoring Suppl (ONETOUCH VERIO) W/DEVICE KIT 1 each by Does not apply route daily. 1 kit 0  . [employer] D3 2000 units CAPS TAKE 1 CAPSULE BY MOUTH EVERY DAY 90 capsule 3  . dorzolamide-timolol (COSOPT) 22.3-6.8 MG/ML ophthalmic solution Place 1 drop into both eyes 2 (two) times daily.    Marland Kitchen FARXIGA 10 MG TABS tablet TAKE 10 MG BY MOUTH DAILY. 90 tablet 1  . latanoprost (XALATAN) 0.005 % ophthalmic solution Place 1 drop into both eyes at bedtime.     Marland Kitchen losartan (COZAAR) 25 MG tablet TAKE 1 TABLET (25 MG TOTAL) BY MOUTH DAILY. PATIENT NEEDS OFFICE VISIT BEFORE REFILLS WILL BE GIVEN 90 tablet 0  . metFORMIN (GLUCOPHAGE) 500 MG tablet TAKE 2 TABLETS BY MOUTH TWICE A DAY WITH A MEAL 360 tablet 2  . ONETOUCH DELICA LANCETS FINE MISC USE TO TEST BLOOD SUGAR 3 TIMES DAILY AS INSTRUCTED. 100 each 4  . ONETOUCH VERIO test strip USE TO TEST BLOOD SUGAR 3 TIMES DAILY AS INSTRUCTED 100 each 2  . BD PEN NEEDLE NANO U/F 32G X 4 MM MISC USE 1 EACH DAY AT BEDTIME 100 each 1  . Insulin Glargine (BASAGLAR KWIKPEN) 100 UNIT/ML SOPN Inject 0.12 mLs (12 Units total) into the skin at bedtime. 5 pen 0   Facility-Administered Medications Prior to Visit  Medication Dose Route Frequency Provider Last Rate Last Dose  . ketoconazole (NIZORAL) 2 % cream   Topical BID Plotnikov, Evie Lacks, MD        ROS: Review of Systems  Constitutional: Negative for appetite change, fatigue and unexpected weight change.  HENT: Negative for congestion, nosebleeds,  sneezing, sore throat and trouble swallowing.   Eyes: Negative for itching and visual disturbance.  Respiratory: Negative for cough.   Cardiovascular: Negative for chest pain, palpitations and leg swelling.  Gastrointestinal: Negative for abdominal distention, blood in stool, diarrhea and nausea.  Genitourinary: Negative for frequency and hematuria.  Musculoskeletal: Negative for back pain, gait problem, joint swelling and neck pain.  Skin: Negative for rash.  Neurological: Negative for dizziness, tremors, speech difficulty and weakness.  Psychiatric/Behavioral: Negative for agitation, dysphoric mood, sleep disturbance and suicidal ideas. The patient is not nervous/anxious.     Objective:  BP 110/72   Pulse 64   Temp 98.5 F (36.9 C) (Oral)   Ht 6' 5"  (1.956 m)   Wt 220 lb (99.8 kg)   SpO2 93%   BMI 26.09 kg/m   BP Readings from Last 3 Encounters:  10/29/17 110/72  05/14/17 108/62  01/08/17 104/78    Wt Readings from Last 3 Encounters:  10/29/17 220 lb (99.8 kg)  05/14/17 220 lb 6.4 oz (100 kg)  01/08/17 224 lb 12.8 oz (102 kg)    Physical Exam  Constitutional: He is oriented to person, place, and time. He appears well-developed. No distress.  NAD  HENT:  Mouth/Throat: Oropharynx is clear and moist.  Eyes: Pupils are equal, round, and reactive to  light. Conjunctivae are normal.  Neck: Normal range of motion. No JVD present. No thyromegaly present.  Cardiovascular: Normal rate, regular rhythm, normal heart sounds and intact distal pulses. Exam reveals no gallop and no friction rub.  No murmur heard. Pulmonary/Chest: Effort normal and breath sounds normal. No respiratory distress. He has no wheezes. He has no rales. He exhibits no tenderness.  Abdominal: Soft. Bowel sounds are normal. He exhibits no distension and no mass. There is no tenderness. There is no rebound and no guarding.  Genitourinary: Rectum normal and prostate normal. Rectal exam shows guaiac negative stool.   Musculoskeletal: Normal range of motion. He exhibits no edema or tenderness.  Lymphadenopathy:    He has no cervical adenopathy.  Neurological: He is alert and oriented to person, place, and time. He has normal reflexes. No cranial nerve deficit. He exhibits normal muscle tone. He displays a negative Romberg sign. Coordination and gait normal.  Skin: Skin is warm and dry. No rash noted.  Psychiatric: He has a normal mood and affect. His behavior is normal. Judgment and thought content normal.    Lab Results  Component Value Date   WBC 5.6 10/26/2016   HGB 15.4 10/26/2016   HCT 47.0 10/26/2016   PLT 163.0 10/26/2016   GLUCOSE 141 (H) 10/26/2016   CHOL 124 10/26/2016   TRIG 163.0 (H) 10/26/2016   HDL 33.00 (L) 10/26/2016   LDLDIRECT 70.0 04/13/2015   LDLCALC 58 10/26/2016   ALT 16 10/26/2016   AST 15 10/26/2016   NA 140 10/26/2016   K 4.5 10/26/2016   CL 104 10/26/2016   CREATININE 0.96 10/26/2016   BUN 20 10/26/2016   CO2 29 10/26/2016   TSH 3.90 10/26/2016   PSA 0.58 10/26/2016   HGBA1C 6.0 05/14/2017   MICROALBUR <0.7 10/26/2016    Patient was never admitted.  Assessment & Plan:   There are no diagnoses linked to this encounter.   No orders of the defined types were placed in this encounter.    Follow-up: No follow-ups on file.  Walker Kehr, MD

## 2017-10-29 NOTE — Assessment & Plan Note (Signed)
Lipitor 

## 2017-10-29 NOTE — Assessment & Plan Note (Addendum)
We discussed age appropriate health related issues, including available/recomended screening tests and vaccinations. We discussed a need for adhering to healthy diet and exercise. Labs were ordered to be later reviewed . All questions were answered. Cologuard Vaccines Card CT suggested

## 2017-10-29 NOTE — Patient Instructions (Signed)

## 2017-10-29 NOTE — Assessment & Plan Note (Signed)
Farxiga, Metformin Losartan, Lipitor

## 2017-10-29 NOTE — Assessment & Plan Note (Signed)
Vit D 

## 2017-10-29 NOTE — Addendum Note (Signed)
Addended by: Scarlett Presto on: 10/29/2017 09:50 AM   Modules accepted: Orders

## 2017-11-19 ENCOUNTER — Ambulatory Visit (INDEPENDENT_AMBULATORY_CARE_PROVIDER_SITE_OTHER): Payer: Managed Care, Other (non HMO) | Admitting: Internal Medicine

## 2017-11-19 ENCOUNTER — Encounter: Payer: Self-pay | Admitting: Internal Medicine

## 2017-11-19 VITALS — BP 120/70 | HR 67 | Ht 77.0 in | Wt 217.0 lb

## 2017-11-19 DIAGNOSIS — E785 Hyperlipidemia, unspecified: Secondary | ICD-10-CM | POA: Diagnosis not present

## 2017-11-19 DIAGNOSIS — G63 Polyneuropathy in diseases classified elsewhere: Secondary | ICD-10-CM | POA: Diagnosis not present

## 2017-11-19 DIAGNOSIS — E1142 Type 2 diabetes mellitus with diabetic polyneuropathy: Secondary | ICD-10-CM

## 2017-11-19 LAB — POCT GLYCOSYLATED HEMOGLOBIN (HGB A1C): HEMOGLOBIN A1C: 5.8 % — AB (ref 4.0–5.6)

## 2017-11-19 NOTE — Patient Instructions (Addendum)
Please continue: - Metformin 1000 mg 2x a day with meals - Farxiga 10 mg in am  If sugars are still controlled after the Holidays, you can decrease Farxiga dose to 5 mg daily.  Please return in 6 months with your sugar log.

## 2017-11-19 NOTE — Progress Notes (Signed)
Patient ID: Clifford Alvarez, male   DOB: 12/17/1955, 62 y.o.   MRN: [id number]  HPI: Clifford Alvarez is a 62 y.o.-year-old male, returning for f/u for DM2 dx 01/28/2013, previously insulin-dependent, now off insulin, controlled, with complications (Peripheral neuropathy). Last visit was 6 months ago.  Last hemoglobin A1c: Lab Results  Component Value Date   HGBA1C 6.0 05/14/2017   HGBA1C 6 01/08/2017   HGBA1C 6.3 10/26/2016   HGBA1C 5.7 08/07/2016   HGBA1C 6.0 04/17/2016   HGBA1C 6.0 12/13/2015   HGBA1C 5.9 08/12/2015   HGBA1C 5.9 04/13/2015   HGBA1C 5.8 12/15/2014   HGBA1C 5.9 08/17/2014   HGBA1C 6.1 05/11/2014   HGBA1C 6.3 02/11/2014   HGBA1C 6.1 11/10/2013   HGBA1C 6.5 08/08/2013   HGBA1C 6.9 (H) 05/06/2013   HGBA1C 12.1 (H) 01/28/2013   He is on: - Metformin 1000 mg 2x a day with meals - Jardiance 25 mg in am >> Farxiga 10 mg in a.m. He was previously on Basaglar 12 units at bedtime >> stopped 08/2016 >> restarted: 6-8 units 3x a day >> now off completely.  Pt checks his sugars 2x a day: - am: 107-145 >> 110s, 175 >> 110, 125-145 - 2h after b'fast: 146 >> 197 >> n/c  - before lunch:  102, 128 >> n/c - 2h after lunch: 1n/c >> 126 >> n/c - before dinner: n/c >> n/c - 2h after dinner:  110-140s >> 130-140 >> n/c - bedtime:  120-174 >> n/c - nighttime:  123-145, 170 >> n/c Lowest sugar was  110 >> 110. He  has hypoglycemia awareness in the 70s. Highest sugar was 175 - 2-3x >> 175.  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  Pt's meals are: - Breakfast: bagel, oatmeal, granola, eggs - Lunch: meat + 2-3 vegetables; soup; salad; sandwich - Dinner: meat + 2-3 vegetables; pasta; [place]; seafood - Snacks: 1 a day >> nuts, popcorn, dark chocolate No regular exercise.  -No CKD, last BUN/creatinine:  Lab Results  Component Value Date   BUN 17 10/29/2017   CREATININE 0.98 10/29/2017   Lab Results  Component Value Date   GFRNONAA >89 04/13/2015   No MAU: Lab  Results  Component Value Date   MICRALBCREAT 1.6 10/29/2017   MICRALBCREAT 0.8 10/26/2016   MICRALBCREAT 0.6 04/13/2015   MICRALBCREAT 0.7 05/11/2014   MICRALBCREAT 0.3 03/12/2013  On losartan  -+ HL. Last set of lipids (fasting): Lab Results  Component Value Date   CHOL 134 10/29/2017   HDL 35.90 (L) 10/29/2017   LDLCALC 65 10/29/2017   LDLDIRECT 70.0 04/13/2015   TRIG 169.0 (H) 10/29/2017   CHOLHDL 4 10/29/2017  On Lipitor. - last eye exam was in 07/2017 at [facility]: No DR, + glaucoma (controlled). Dr. Edilia Bo.  No blurry vision. - He does have numbness and tingling in his feet.   ROS: Constitutional: no weight gain/no weight loss, no fatigue, no subjective hyperthermia, no subjective hypothermia Eyes: no blurry vision, no xerophthalmia ENT: no sore throat, no nodules palpated in neck, no dysphagia, no odynophagia, no hoarseness Cardiovascular: no CP/no SOB/no palpitations/no leg swelling Respiratory: no cough/no SOB/no wheezing Gastrointestinal: no N/no V/no D/no C/no acid reflux Musculoskeletal: no muscle aches/no joint aches Skin: no rashes, no hair loss Neurological: no tremors/+ numbness/+ tingling/no dizziness  I reviewed pt's medications, allergies, PMH, social hx, family hx, and changes were documented in the history of present illness. Otherwise, unchanged from my initial visit note.  Past Medical History:  Diagnosis Date  .  Diabetes mellitus without complication ([place])   . Glaucoma   . Glaucoma   . Heart murmur    History of childhood heart murmur (Aortic)   Past Surgical History:  Procedure Laterality Date  . COLONOSCOPY  2006   Dr. Etha Stambaugh Gong   Social History   Socioeconomic History  . Marital status: Married    Spouse name: Clifford Alvarez  . Number of children: 2  . Years of education: Not on file  . Highest education level: Not on file  Occupational History  . 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 Financial  resource strain: Not on file  . Food insecurity:    Worry: Not on file    Inability: Not on file  . Transportation needs:    Medical: Not on file    Non-medical: Not on file  Tobacco Use  . Smoking status: Never Smoker  . Smokeless tobacco: Never Used  Substance and Sexual Activity  . Alcohol use: No  . Drug use: No  . Sexual activity: Not on file  Lifestyle  . Physical activity:    Days per week: Not on file    Minutes per session: Not on file  . Stress: Not on file  Relationships  . Social connections:    Talks on phone: Not on file    Gets together: Not on file    Attends religious service: Not on file    Active member of club or organization: Not on file    Attends meetings of clubs or organizations: Not on file    Relationship status: Not on file  . Intimate partner violence:    Fear of current or ex partner: Not on file    Emotionally abused: Not on file    Physically abused: Not on file    Forced sexual activity: Not on file  Other Topics Concern  . Not on file  Social History Narrative   Married 50 years   Wife - [name] 40   Daughter 54   Current Outpatient Medications on File Prior to Visit  Medication Sig Dispense Refill  . atorvastatin (LIPITOR) 20 MG tablet TAKE 1 TABLET (20 MG TOTAL) BY MOUTH DAILY. KEEP APPOINTMENT FOR FURTHER REFILLS 90 tablet 0  . b complex vitamins tablet Take 1 tablet by mouth daily. 100 tablet 3  . Blood Glucose Monitoring Suppl (ONETOUCH VERIO) W/DEVICE KIT 1 each by Does not apply route daily. 1 kit 0  . CVS D3 2000 units CAPS TAKE 1 CAPSULE BY MOUTH EVERY DAY 90 capsule 3  . dorzolamide-timolol (COSOPT) 22.3-6.8 MG/ML ophthalmic solution Place 1 drop into both eyes 2 (two) times daily.    Marland Kitchen FARXIGA 10 MG TABS tablet TAKE 10 MG BY MOUTH DAILY. 90 tablet 1  . latanoprost (XALATAN) 0.005 % ophthalmic solution Place 1 drop into both eyes at bedtime.     Marland Kitchen losartan (COZAAR) 25 MG tablet TAKE 1 TABLET (25 MG TOTAL) BY MOUTH  DAILY. PATIENT NEEDS OFFICE VISIT BEFORE REFILLS WILL BE GIVEN 90 tablet 0  . metFORMIN (GLUCOPHAGE) 500 MG tablet TAKE 2 TABLETS BY MOUTH TWICE A DAY WITH A MEAL 360 tablet 2  . ONETOUCH DELICA LANCETS FINE MISC USE TO TEST BLOOD SUGAR 3 TIMES DAILY AS INSTRUCTED. 100 each 4  . ONETOUCH VERIO test strip USE TO TEST BLOOD SUGAR 3 TIMES DAILY AS INSTRUCTED 100 each 2   Current Facility-Administered Medications on File Prior to Visit  Medication Dose Route Frequency Provider Last Rate Last Dose  . ketoconazole (NIZORAL) 2 % cream   Topical BID Plotnikov, Evie Lacks, MD       Allergies  Allergen Reactions  . Penicillins     As a child   Family History  Problem Relation Age of Onset  . Diabetes Mother   . Hypertension Mother   . Diabetes Father   . Heart disease Father   . Diabetes Brother   . Diabetes Sister     PE: Ht 6' 5"  (1.956 m)   Wt 217 lb (98.4 kg)   BMI 25.73 kg/m  Body mass index is 26.09 kg/m.  Wt Readings from Last 3 Encounters:  11/19/17 217 lb (98.4 kg)  10/29/17 220 lb (99.8 kg)  05/14/17 220 lb 6.4 oz (100 kg)   Constitutional: overweight, in NAD Eyes: PERRLA, EOMI, no exophthalmos ENT: moist mucous membranes, no thyromegaly, no cervical lymphadenopathy Cardiovascular: RRR, No MRG Respiratory: CTA B Gastrointestinal: abdomen soft, NT, ND, BS+ Musculoskeletal: no deformities, strength intact in all 4 Skin: moist, warm, no rashes Neurological: no tremor with outstretched hands, DTR normal in all 4   ASSESSMENT: 1. DM2, insulin-dependent, controlled, with complications  - PN - stable  - in 08/2015, we discussed about the CANVAS study with [employer] and the increased risk in amputations - most in men, HbA1c >8, PN or PVD.  2. PN 2/2 diabetes  3. HL  PLAN:  1. Patient with well-controlled diabetes, on oral medication with metformin and SGLT2 inhibitor, with sugars at goal at last visit except for very few hyperglycemic spikes.  We were able to stop  insulin in the recent past as his sugars improved.  HbA1c was excellent, at 6%. - At this visit, sugars remain at goal, so we do not need to restart his insulin.  We will continue metformin and Farxiga as he does not have any side effects from them.  However, I advised him that after the holidays, if the sugars remain controlled, he can decrease the [place] to 5 mg daily.  In the future, we may even be able to stop this. - I suggested to: Patient Instructions  Please continue: - Metformin 1000 mg 2x a day with meals - Farxiga 10 mg in am  Please return in 6 months with your sugar log.   - today, HbA1c is 5.8% (excellent, improved) - continue checking sugars at different times of the day - check 1x a day, rotating checks - advised for yearly eye exams >> he is UTD - UTD flu shot - Return to clinic in 6 mo with sugar log    2. PN 2/2 DM  -Persisting numbness -Continues on alpha lipoic acid and B complex.  He feels these are helping  3. HL - Reviewed latest lipid panel from last month: HDL low, triglycerides slightly high, LDL at goal Lab Results  Component Value Date   CHOL 134 10/29/2017   HDL 35.90 (L) 10/29/2017   LDLCALC 65 10/29/2017   LDLDIRECT 70.0 04/13/2015   TRIG 169.0 (H) 10/29/2017   CHOLHDL 4 10/29/2017  - Continues Lipitor without side effects.  Philemon Kingdom, MD PhD [facility] Endocrinology

## 2017-11-20 LAB — COLOGUARD

## 2017-12-12 ENCOUNTER — Encounter: Payer: Self-pay | Admitting: Internal Medicine

## 2018-01-07 ENCOUNTER — Ambulatory Visit (INDEPENDENT_AMBULATORY_CARE_PROVIDER_SITE_OTHER): Payer: Managed Care, Other (non HMO) | Admitting: Emergency Medicine

## 2018-01-07 DIAGNOSIS — Z23 Encounter for immunization: Secondary | ICD-10-CM | POA: Diagnosis not present

## 2018-01-17 ENCOUNTER — Other Ambulatory Visit: Payer: Self-pay | Admitting: Internal Medicine

## 2018-04-22 ENCOUNTER — Other Ambulatory Visit: Payer: Self-pay | Admitting: Internal Medicine

## 2018-05-16 ENCOUNTER — Other Ambulatory Visit: Payer: Self-pay

## 2018-05-20 ENCOUNTER — Encounter: Payer: Self-pay | Admitting: Internal Medicine

## 2018-05-20 ENCOUNTER — Ambulatory Visit (INDEPENDENT_AMBULATORY_CARE_PROVIDER_SITE_OTHER): Payer: Managed Care, Other (non HMO) | Admitting: Internal Medicine

## 2018-05-20 ENCOUNTER — Other Ambulatory Visit: Payer: Self-pay

## 2018-05-20 VITALS — BP 118/70 | HR 67 | Temp 98.6°F | Ht 77.0 in | Wt 216.0 lb

## 2018-05-20 DIAGNOSIS — E785 Hyperlipidemia, unspecified: Secondary | ICD-10-CM | POA: Diagnosis not present

## 2018-05-20 DIAGNOSIS — E1142 Type 2 diabetes mellitus with diabetic polyneuropathy: Secondary | ICD-10-CM

## 2018-05-20 LAB — POCT GLYCOSYLATED HEMOGLOBIN (HGB A1C): Hemoglobin A1C: 6.1 % — AB (ref 4.0–5.6)

## 2018-05-20 MED ORDER — METFORMIN HCL 500 MG PO TABS: ORAL_TABLET | ORAL | 3 refills | Status: DC

## 2018-05-20 MED ORDER — DAPAGLIFLOZIN PROPANEDIOL 5 MG PO TABS: 5.0000 mg | ORAL_TABLET | Freq: Every day | ORAL | 3 refills | Status: DC

## 2018-05-20 NOTE — Addendum Note (Signed)
Addended by: Darliss Ridgel I on: 05/20/2018 08:59 AM   Modules accepted: Orders

## 2018-05-20 NOTE — Patient Instructions (Signed)
Please continue: - Metformin 1000 mg 2x a day with meals (may try to stop the am dose) - Farxiga 10 mg in am  Please return in 6 months with your sugar log.

## 2018-05-20 NOTE — Telephone Encounter (Signed)
Please advise 

## 2018-05-20 NOTE — Progress Notes (Signed)
Patient ID: Clifford Alvarez, male   DOB: 01-02-1956, 63 y.o.   MRN: [id number]  HPI: Clifford Alvarez is a 63 y.o.-year-old male, returning for f/u for DM2 dx 01/28/2013, previously insulin-dependent, now off insulin, controlled, with complications (Peripheral neuropathy). Last visit was 7 months ago.  Since last visit, he was able to decrease the Farxiga dose and he did not feel that his sugars increase.  Reviewed HbA1c levels: Lab Results  Component Value Date   HGBA1C 5.8 (A) 11/19/2017   HGBA1C 6.0 05/14/2017   HGBA1C 6 01/08/2017   HGBA1C 6.3 10/26/2016   HGBA1C 5.7 08/07/2016   HGBA1C 6.0 04/17/2016   HGBA1C 6.0 12/13/2015   HGBA1C 5.9 08/12/2015   HGBA1C 5.9 04/13/2015   HGBA1C 5.8 12/15/2014   HGBA1C 5.9 08/17/2014   HGBA1C 6.1 05/11/2014   HGBA1C 6.3 02/11/2014   HGBA1C 6.1 11/10/2013   HGBA1C 6.5 08/08/2013   HGBA1C 6.9 (H) 05/06/2013   HGBA1C 12.1 (H) 01/28/2013   He is on: - Metformin 1000 mg 2x a day with meals - Jardiance 25 mg in am >> Farxiga 10 mg in a.m. >> 5 mg in am He was previously on Basaglar 12 units at bedtime >> stopped 08/2016 >> restarted: 6-8 units 3x a day >> now off completely.  Pt checks his sugars 1x day: - am: 110s, 175 >> 110, 125-145 >> 115-140, 160 - 2h after b'fast: 146 >> 197 >> n/c  - before lunch:  102, 128 >> n/c - 2h after lunch: 1n/c >> 126 >> n/c - before dinner: n/c >> n/c - 2h after dinner:  110-140s >> 130-140 >> n/c - bedtime:  120-174 >> n/c - nighttime:  123-145, 170 >> n/c Lowest sugar was  110 >> 110 >> 115. He  has hypoglycemia awareness in the 70s. Highest sugar was 175 - 2-3x >> 175 >> - 2 weeks ago: 160.  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  Pt's meals are: - Breakfast: bagel, oatmeal, granola, eggs - Lunch: meat + 2-3 vegetables; soup; salad; sandwich - Dinner: meat + 2-3 vegetables; pasta; [place]; seafood - Snacks: 1 a day >> nuts, popcorn, dark chocolate No regular exercise.  -No CKD, last  BUN/creatinine:  Lab Results  Component Value Date   BUN 17 10/29/2017   CREATININE 0.98 10/29/2017   Lab Results  Component Value Date   GFRNONAA >89 04/13/2015   No MAU: Lab Results  Component Value Date   MICRALBCREAT 1.6 10/29/2017   MICRALBCREAT 0.8 10/26/2016   MICRALBCREAT 0.6 04/13/2015   MICRALBCREAT 0.7 05/11/2014   MICRALBCREAT 0.3 03/12/2013  On losartan.  -+ HL. Last set of lipids (fasting): Lab Results  Component Value Date   CHOL 134 10/29/2017   HDL 35.90 (L) 10/29/2017   LDLCALC 65 10/29/2017   LDLDIRECT 70.0 04/13/2015   TRIG 169.0 (H) 10/29/2017   CHOLHDL 4 10/29/2017  On Lipitor. - last eye exam was in 01/2018 at [facility]: No DR, + controlled glaucoma. Dr. Edilia Bo.   - + numbness and tingling in his feet.   ROS: Constitutional: no weight gain/no weight loss, no fatigue, no subjective hyperthermia, no subjective hypothermia Eyes: no blurry vision, no xerophthalmia ENT: no sore throat, no nodules palpated in neck, no dysphagia, no odynophagia, no hoarseness Cardiovascular: no CP/no SOB/no palpitations/no leg swelling Respiratory: no cough/no SOB/no wheezing Gastrointestinal: no N/no V/no D/no C/no acid reflux Musculoskeletal: no muscle aches/no joint aches Skin: no rashes, no hair loss Neurological: no tremors/+ numbness/+ tingling/no  dizziness  I reviewed pt's medications, allergies, PMH, social hx, family hx, and changes were documented in the history of present illness. Otherwise, unchanged from my initial visit note.  Past Medical History:  Diagnosis Date  . Diabetes mellitus without complication ([place])   . Glaucoma   . Glaucoma   . Heart murmur    History of childhood heart murmur ([place])   Past Surgical History:  Procedure Laterality Date  . COLONOSCOPY  2006   Dr. Earnest Thalman Gong   Social History   Socioeconomic History  . Marital status: Married    Spouse name: Jackelyn Poling  . Number of children: 2  . Years of education: Not on file  .  Highest education level: Not on file  Occupational History  . 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 Financial resource strain: Not on file  . Food insecurity:    Worry: Not on file    Inability: Not on file  . Transportation needs:    Medical: Not on file    Non-medical: Not on file  Tobacco Use  . Smoking status: Never Smoker  . Smokeless tobacco: Never Used  Substance and Sexual Activity  . Alcohol use: No  . Drug use: No  . Sexual activity: Not on file  Lifestyle  . Physical activity:    Days per week: Not on file    Minutes per session: Not on file  . Stress: Not on file  Relationships  . Social connections:    Talks on phone: Not on file    Gets together: Not on file    Attends religious service: Not on file    Active member of club or organization: Not on file    Attends meetings of clubs or organizations: Not on file    Relationship status: Not on file  . Intimate partner violence:    Fear of current or ex partner: Not on file    Emotionally abused: Not on file    Physically abused: Not on file    Forced sexual activity: Not on file  Other Topics Concern  . Not on file  Social History Narrative   Married 35 years   Wife - [name] 18   Daughter 28   Current Outpatient Medications on File Prior to Visit  Medication Sig Dispense Refill  . atorvastatin (LIPITOR) 20 MG tablet TAKE 1 TABLET (20 MG TOTAL) BY MOUTH DAILY 90 tablet 3  . b complex vitamins tablet Take 1 tablet by mouth daily. 100 tablet 3  . Blood Glucose Monitoring Suppl (ONETOUCH VERIO) W/DEVICE KIT 1 each by Does not apply route daily. 1 kit 0  . CVS D3 2000 units CAPS TAKE 1 CAPSULE BY MOUTH EVERY DAY 90 capsule 3  . dorzolamide-timolol (COSOPT) 22.3-6.8 MG/ML ophthalmic solution Place 1 drop into both eyes 2 (two) times daily.    Marland Kitchen FARXIGA 10 MG TABS tablet TAKE 10 MG BY MOUTH DAILY. 90 tablet 1  . Lancets (ONETOUCH DELICA PLUS [id number]) MISC USE  TO TEST BLOOD SUGAR 3 TIMES DAILY AS INSTRUCTED. 100 each 4  . latanoprost (XALATAN) 0.005 % ophthalmic solution Place 1 drop into both eyes at bedtime.     Marland Kitchen losartan (COZAAR) 25 MG tablet Take 1 tablet (25 mg total) by mouth daily. 90 tablet 3  . metFORMIN (GLUCOPHAGE) 500 MG tablet TAKE 2 TABLETS BY MOUTH TWICE A DAY WITH A MEAL 360 tablet 2  . ONETOUCH VERIO  test strip USE TO TEST BLOOD SUGAR 3 TIMES DAILY AS INSTRUCTED 100 each 2   Current Facility-Administered Medications on File Prior to Visit  Medication Dose Route Frequency Provider Last Rate Last Dose  . ketoconazole (NIZORAL) 2 % cream   Topical BID Plotnikov, Evie Lacks, MD       Allergies  Allergen Reactions  . Penicillins     As a child   Family History  Problem Relation Age of Onset  . Diabetes Mother   . Hypertension Mother   . Diabetes Father   . Heart disease Father   . Diabetes Brother   . Diabetes Sister     PE: BP 118/70   Pulse 67   Temp 98.6 F (37 C)   Ht 6' 5"  (1.956 m)   Wt 216 lb (98 kg)   SpO2 95%   BMI 25.61 kg/m  Body mass index is 25.61 kg/m.  Wt Readings from Last 3 Encounters:  05/20/18 216 lb (98 kg)  11/19/17 217 lb (98.4 kg)  10/29/17 220 lb (99.8 kg)   Constitutional: Slightly overweight, in NAD Eyes: PERRLA, EOMI, no exophthalmos ENT: moist mucous membranes, no thyromegaly, no cervical lymphadenopathy Cardiovascular: RRR, No MRG Respiratory: CTA B Gastrointestinal: abdomen soft, NT, ND, BS+ Musculoskeletal: no deformities, strength intact in all 4 Skin: moist, warm, no rashes Neurological: no tremor with outstretched hands, DTR normal in all 4  ASSESSMENT: 1. DM2, insulin-independent, controlled, with complications  - PN - stable  - in 08/2015, we discussed about the CANVAS study with [employer] and the increased risk in amputations - most in men, HbA1c >8, PN or PVD.  2. PN 2/2 diabetes  3. HL  PLAN:  1. Patient with well-controlled diabetes, on oral medication with  metformin and SGLT 2 inhibitor, with sugars at goal at last visits except for very few hyperglycemic spikes.  We were able to stop insulin in the past as his sugars improved.  HbA1c was excellent at last visit, at 5.8%.  We continued metformin at [facility] that she is tolerating this well, without any side effects.  We discussed about the possibility of decreasing Farxiga dose to 5 mg at last visit.  She was able to do so without an increase in blood sugars.  However, he is only checking sugars in the morning and I strongly advised him to rotate his sugar checks at all times of the day. -At this visit, based on the morning sugars they are at goal, only occasionally slightly higher depending on the dinner.  For now, there is no need to change his regimen but we did discuss that he may be able to decrease the metformin dose by stopping the a.m. dose.  For now, we will keep [place] and we discussed about the cardiovascular benefits of this class of medications. - I suggested to: Patient Instructions  Please continue: - Metformin 1000 mg 2x a day with meals (may try to stop the am dose) - Farxiga 10 mg in am  Please return in 6 months with your sugar log.   - today, HbA1c is 6.1% (higher, but still excellent) - continue checking sugars at different times of the day - check 1x a day, rotating checks - advised for yearly eye exams >> he is UTD - Return to clinic in 6 mo with sugar log    2. PN 2/2 DM  - Continues to have numbness, no pain -Continues on alpha-lipoic acid and B complex.  He feels that these are  helping.    3. HL - Reviewed latest lipid panel from 10/2017: LDL at goal, triglycerides slightly high, HDL low Lab Results  Component Value Date   CHOL 134 10/29/2017   HDL 35.90 (L) 10/29/2017   LDLCALC 65 10/29/2017   LDLDIRECT 70.0 04/13/2015   TRIG 169.0 (H) 10/29/2017   CHOLHDL 4 10/29/2017  - Continues Lipitor without side effects.  Philemon Kingdom, MD PhD [facility]  Endocrinology

## 2018-07-22 ENCOUNTER — Other Ambulatory Visit: Payer: Self-pay | Admitting: Internal Medicine

## 2018-07-22 NOTE — Telephone Encounter (Signed)
Please advise if refill is appropriate since this was dc on 05/20/18

## 2018-07-22 NOTE — Telephone Encounter (Signed)
Ok to refill 

## 2018-09-30 ENCOUNTER — Other Ambulatory Visit: Payer: Self-pay | Admitting: Internal Medicine

## 2018-10-18 ENCOUNTER — Other Ambulatory Visit: Payer: Self-pay | Admitting: Internal Medicine

## 2018-11-04 ENCOUNTER — Ambulatory Visit (INDEPENDENT_AMBULATORY_CARE_PROVIDER_SITE_OTHER): Payer: BC Managed Care – PPO | Admitting: Internal Medicine

## 2018-11-04 ENCOUNTER — Other Ambulatory Visit (INDEPENDENT_AMBULATORY_CARE_PROVIDER_SITE_OTHER): Payer: BC Managed Care – PPO

## 2018-11-04 ENCOUNTER — Other Ambulatory Visit: Payer: Self-pay

## 2018-11-04 ENCOUNTER — Encounter: Payer: Self-pay | Admitting: Internal Medicine

## 2018-11-04 VITALS — BP 118/74 | HR 62 | Temp 98.2°F | Ht 77.0 in | Wt 219.0 lb

## 2018-11-04 DIAGNOSIS — Z23 Encounter for immunization: Secondary | ICD-10-CM | POA: Diagnosis not present

## 2018-11-04 DIAGNOSIS — E559 Vitamin D deficiency, unspecified: Secondary | ICD-10-CM

## 2018-11-04 DIAGNOSIS — E1142 Type 2 diabetes mellitus with diabetic polyneuropathy: Secondary | ICD-10-CM

## 2018-11-04 DIAGNOSIS — Z0000 Encounter for general adult medical examination without abnormal findings: Secondary | ICD-10-CM

## 2018-11-04 DIAGNOSIS — Z125 Encounter for screening for malignant neoplasm of prostate: Secondary | ICD-10-CM | POA: Diagnosis not present

## 2018-11-04 DIAGNOSIS — E785 Hyperlipidemia, unspecified: Secondary | ICD-10-CM

## 2018-11-04 LAB — HEPATIC FUNCTION PANEL
ALT: 17 U/L (ref 0–53)
AST: 18 U/L (ref 0–37)
Albumin: 4.6 g/dL (ref 3.5–5.2)
Alkaline Phosphatase: 86 U/L (ref 39–117)
Bilirubin, Direct: 0.2 mg/dL (ref 0.0–0.3)
Total Bilirubin: 1.1 mg/dL (ref 0.2–1.2)
Total Protein: 6.9 g/dL (ref 6.0–8.3)

## 2018-11-04 LAB — LDL CHOLESTEROL, DIRECT: Direct LDL: 84 mg/dL

## 2018-11-04 LAB — BASIC METABOLIC PANEL
BUN: 20 mg/dL (ref 6–23)
CO2: 27 mEq/L (ref 19–32)
Calcium: 9.1 mg/dL (ref 8.4–10.5)
Chloride: 104 mEq/L (ref 96–112)
Creatinine, Ser: 0.96 mg/dL (ref 0.40–1.50)
GFR: 78.72 mL/min (ref >60.00)
Glucose, Bld: 133 mg/dL — ABNORMAL HIGH (ref 70–99)
Potassium: 4.3 mEq/L (ref 3.5–5.1)
Sodium: 139 mEq/L (ref 135–145)

## 2018-11-04 LAB — LIPID PANEL
Cholesterol: 151 mg/dL (ref 0–200)
HDL: 35.3 mg/dL — ABNORMAL LOW (ref >39.00)
NonHDL: 115.45
Total CHOL/HDL Ratio: 4
Triglycerides: 216 mg/dL — ABNORMAL HIGH (ref 0.0–149.0)
VLDL: 43.2 mg/dL — ABNORMAL HIGH (ref 0.0–40.0)

## 2018-11-04 LAB — CBC WITH DIFFERENTIAL/PLATELET
Basophils Absolute: 0 10*3/uL (ref 0.0–0.1)
Basophils Relative: 0.3 % (ref 0.0–3.0)
Eosinophils Absolute: 0.3 10*3/uL (ref 0.0–0.7)
Eosinophils Relative: 4.2 % (ref 0.0–5.0)
HCT: 46.7 % (ref 39.0–52.0)
Hemoglobin: 15.5 g/dL (ref 13.0–17.0)
Lymphocytes Relative: 25.9 % (ref 12.0–46.0)
Lymphs Abs: 1.8 10*3/uL (ref 0.7–4.0)
MCHC: 33.2 g/dL (ref 30.0–36.0)
MCV: 92 fl (ref 78.0–100.0)
Monocytes Absolute: 0.6 10*3/uL (ref 0.1–1.0)
Monocytes Relative: 8.8 % (ref 3.0–12.0)
Neutro Abs: 4.3 10*3/uL (ref 1.4–7.7)
Neutrophils Relative %: 60.8 % (ref 43.0–77.0)
Platelets: 184 10*3/uL (ref 150.0–400.0)
RBC: 5.08 Mil/uL (ref 4.22–5.81)
RDW: 13.6 % (ref 11.5–15.5)
WBC: 7 10*3/uL (ref 4.0–10.5)

## 2018-11-04 LAB — MICROALBUMIN / CREATININE URINE RATIO
Creatinine,U: 111.3 mg/dL
Microalb Creat Ratio: 0.6 mg/g (ref 0.0–30.0)
Microalb, Ur: <0.7 mg/dL (ref 0.0–1.9)

## 2018-11-04 LAB — TSH: TSH: 2.83 u[IU]/mL (ref 0.35–4.50)

## 2018-11-04 LAB — URINALYSIS
Bilirubin Urine: NEGATIVE
Hgb urine dipstick: NEGATIVE
Leukocytes,Ua: NEGATIVE
Nitrite: NEGATIVE
Specific Gravity, Urine: 1.025 (ref 1.000–1.030)
Total Protein, Urine: NEGATIVE
Urine Glucose: >=1000 — AB
Urobilinogen, UA: 0.2 (ref 0.0–1.0)
pH: 5.5 (ref 5.0–8.0)

## 2018-11-04 LAB — PSA: PSA: 0.64 ng/mL (ref 0.10–4.00)

## 2018-11-04 MED ORDER — LOSARTAN POTASSIUM 25 MG PO TABS: 25.0000 mg | ORAL_TABLET | Freq: Every day | ORAL | 3 refills | Status: DC

## 2018-11-04 MED ORDER — ATORVASTATIN CALCIUM 20 MG PO TABS: ORAL_TABLET | ORAL | 3 refills | Status: DC

## 2018-11-04 NOTE — Progress Notes (Signed)
Subjective:  Patient ID: Clifford Alvarez, male    DOB: 1955/01/20  Age: 63 y.o. MRN: [id number]  CC: No chief complaint on file.   HPI Clifford Alvarez presents for a well exam  Outpatient Medications Prior to Visit  Medication Sig Dispense Refill  . atorvastatin (LIPITOR) 20 MG tablet TAKE 1 TABLET (20 MG TOTAL) BY MOUTH DAILY 90 tablet 3  . b complex vitamins tablet Take 1 tablet by mouth daily. 100 tablet 3  . Blood Glucose Monitoring Suppl (ONETOUCH VERIO) W/DEVICE KIT 1 each by Does not apply route daily. 1 kit 0  . [facility]S D3 50 MCG (2000 UT) CAPS TAKE 1 CAPSULE BY MOUTH EVERY DAY 90 capsule 3  . dorzolamide-timolol (COSOPT) 22.3-6.8 MG/ML ophthalmic solution Place 1 drop into both eyes 2 (two) times daily.    Marland Kitchen FARXIGA 10 MG TABS tablet TAKE 10 MG BY MOUTH DAILY. 90 tablet 1  . Lancets (ONETOUCH DELICA PLUS [id number]) MISC USE TO TEST BLOOD SUGAR 3 TIMES DAILY AS INSTRUCTED. 100 each 4  . latanoprost (XALATAN) 0.005 % ophthalmic solution Place 1 drop into both eyes at bedtime.     Marland Kitchen losartan (COZAAR) 25 MG tablet Take 1 tablet (25 mg total) by mouth daily. 90 tablet 3  . metFORMIN (GLUCOPHAGE) 500 MG tablet TAKE 2 TABLETS BY MOUTH TWICE A DAY WITH A MEAL 360 tablet 2  . ONETOUCH VERIO test strip USE TO TEST BLOOD SUGAR 3 TIMES DAILY AS INSTRUCTED 100 each 2  . dapagliflozin propanediol (FARXIGA) 5 MG TABS tablet Take 5 mg by mouth daily. 90 tablet 3   Facility-Administered Medications Prior to Visit  Medication Dose Route Frequency Provider Last Rate Last Dose  . ketoconazole (NIZORAL) 2 % cream   Topical BID Francys Bolin, Evie Lacks, MD        ROS: Review of Systems  Constitutional: Negative for appetite change, fatigue and unexpected weight change.  HENT: Negative for congestion, nosebleeds, sneezing, sore throat and trouble swallowing.   Eyes: Negative for itching and visual disturbance.  Respiratory: Negative for cough.   Cardiovascular: Negative for chest pain,  palpitations and leg swelling.  Gastrointestinal: Negative for abdominal distention, blood in stool, diarrhea and nausea.  Genitourinary: Negative for frequency and hematuria.  Musculoskeletal: Negative for arthralgias, back pain, gait problem, joint swelling and neck pain.  Skin: Negative for rash.  Neurological: Negative for dizziness, tremors, speech difficulty and weakness.  Psychiatric/Behavioral: Negative for agitation, dysphoric mood, sleep disturbance and suicidal ideas. The patient is not nervous/anxious.     Objective:  BP 118/74 (BP Location: Left Arm, Patient Position: Sitting, Cuff Size: Large)   Pulse 62   Temp 98.2 F (36.8 C) (Oral)   Ht 6' 5"  (1.956 m)   Wt 219 lb (99.3 kg)   SpO2 99%   BMI 25.97 kg/m   BP Readings from Last 3 Encounters:  11/04/18 118/74  05/20/18 118/70  11/19/17 120/70    Wt Readings from Last 3 Encounters:  11/04/18 219 lb (99.3 kg)  05/20/18 216 lb (98 kg)  11/19/17 217 lb (98.4 kg)    Physical Exam Constitutional:      General: He is not in acute distress.    Appearance: He is well-developed.     Comments: NAD  Eyes:     Conjunctiva/sclera: Conjunctivae normal.     Pupils: Pupils are equal, round, and reactive to light.  Neck:     Musculoskeletal: Normal range of motion.     Thyroid: No  thyromegaly.     Vascular: No JVD.  Cardiovascular:     Rate and Rhythm: Normal rate and regular rhythm.     Heart sounds: Normal heart sounds. No murmur. No friction rub. No gallop.   Pulmonary:     Effort: Pulmonary effort is normal. No respiratory distress.     Breath sounds: Normal breath sounds. No wheezing or rales.  Chest:     Chest wall: No tenderness.  Abdominal:     General: Bowel sounds are normal. There is no distension.     Palpations: Abdomen is soft. There is no mass.     Tenderness: There is no abdominal tenderness. There is no guarding or rebound.  Genitourinary:    Prostate: Normal.     Rectum: Guaiac result negative.   Musculoskeletal: Normal range of motion.        General: No tenderness.  Lymphadenopathy:     Cervical: No cervical adenopathy.  Skin:    General: Skin is warm and dry.     Findings: No rash.  Neurological:     Mental Status: He is alert and oriented to person, place, and time.     Cranial Nerves: No cranial nerve deficit.     Motor: No abnormal muscle tone.     Coordination: Coordination normal.     Gait: Gait normal.     Deep Tendon Reflexes: Reflexes are normal and symmetric.  Psychiatric:        Behavior: Behavior normal.        Thought Content: Thought content normal.        Judgment: Judgment normal.   no bruit B  Lab Results  Component Value Date   WBC 8.2 10/29/2017   HGB 15.4 10/29/2017   HCT 45.0 10/29/2017   PLT 167.0 10/29/2017   GLUCOSE 137 (H) 10/29/2017   CHOL 134 10/29/2017   TRIG 169.0 (H) 10/29/2017   HDL 35.90 (L) 10/29/2017   LDLDIRECT 70.0 04/13/2015   LDLCALC 65 10/29/2017   ALT 14 10/29/2017   AST 15 10/29/2017   NA 139 10/29/2017   K 4.2 10/29/2017   CL 103 10/29/2017   CREATININE 0.98 10/29/2017   BUN 17 10/29/2017   CO2 27 10/29/2017   TSH 3.08 10/29/2017   PSA 1.57 10/29/2017   HGBA1C 6.1 (A) 05/20/2018   MICROALBUR 1.3 10/29/2017    Patient was never admitted.  Assessment & Plan:   There are no diagnoses linked to this encounter.   No orders of the defined types were placed in this encounter.    Follow-up: No follow-ups on file.  Walker Kehr, MD

## 2018-11-04 NOTE — Assessment & Plan Note (Signed)
Labs A1c in 2 weeks

## 2018-11-04 NOTE — Assessment & Plan Note (Signed)
Lipitor 

## 2018-11-04 NOTE — Patient Instructions (Signed)

## 2018-11-04 NOTE — Addendum Note (Signed)
Addended by: Karren Cobble on: 11/04/2018 08:44 AM   Modules accepted: Orders

## 2018-11-04 NOTE — Assessment & Plan Note (Signed)
We discussed age appropriate health related issues, including available/recomended screening tests and vaccinations. We discussed a need for adhering to healthy diet and exercise. Labs were ordered to be later reviewed . All questions were answered. Cologuard 2019 Vaccines Card CT suggested again Eye exam q 12 mo

## 2018-11-04 NOTE — Assessment & Plan Note (Signed)
Vit D 

## 2018-11-05 ENCOUNTER — Other Ambulatory Visit: Payer: Self-pay | Admitting: Internal Medicine

## 2018-11-05 DIAGNOSIS — E785 Hyperlipidemia, unspecified: Secondary | ICD-10-CM

## 2018-11-07 ENCOUNTER — Other Ambulatory Visit: Payer: Self-pay

## 2018-11-11 ENCOUNTER — Ambulatory Visit (INDEPENDENT_AMBULATORY_CARE_PROVIDER_SITE_OTHER): Payer: BC Managed Care – PPO | Admitting: Internal Medicine

## 2018-11-11 ENCOUNTER — Encounter: Payer: Self-pay | Admitting: Internal Medicine

## 2018-11-11 ENCOUNTER — Other Ambulatory Visit: Payer: Self-pay

## 2018-11-11 VITALS — BP 118/80 | HR 78 | Ht 77.0 in | Wt 218.0 lb

## 2018-11-11 DIAGNOSIS — G63 Polyneuropathy in diseases classified elsewhere: Secondary | ICD-10-CM

## 2018-11-11 DIAGNOSIS — E785 Hyperlipidemia, unspecified: Secondary | ICD-10-CM

## 2018-11-11 DIAGNOSIS — E1142 Type 2 diabetes mellitus with diabetic polyneuropathy: Secondary | ICD-10-CM

## 2018-11-11 LAB — POCT GLYCOSYLATED HEMOGLOBIN (HGB A1C): Hemoglobin A1C: 6 % — AB (ref 4.0–5.6)

## 2018-11-11 MED ORDER — CANAGLIFLOZIN 100 MG PO TABS: 100.0000 mg | ORAL_TABLET | Freq: Every day | ORAL | 3 refills | Status: DC

## 2018-11-11 NOTE — Progress Notes (Signed)
Patient ID: Clifford Alvarez, male   DOB: 1955/04/15, 63 y.o.   MRN: [id number]  HPI: Clifford Alvarez is a 63 y.o.-year-old male, returning for f/u for DM2 dx 01/28/2013, previously insulin-dependent, now off insulin, controlled, with complications (Peripheral neuropathy). Last visit was 6 months ago. He just changed his insurance.  Reviewed HbA1c levels: Lab Results  Component Value Date   HGBA1C 6.1 (A) 05/20/2018   HGBA1C 5.8 (A) 11/19/2017   HGBA1C 6.0 05/14/2017   HGBA1C 6 01/08/2017   HGBA1C 6.3 10/26/2016   HGBA1C 5.7 08/07/2016   HGBA1C 6.0 04/17/2016   HGBA1C 6.0 12/13/2015   HGBA1C 5.9 08/12/2015   HGBA1C 5.9 04/13/2015   HGBA1C 5.8 12/15/2014   HGBA1C 5.9 08/17/2014   HGBA1C 6.1 05/11/2014   HGBA1C 6.3 02/11/2014   HGBA1C 6.1 11/10/2013   HGBA1C 6.5 08/08/2013   HGBA1C 6.9 (H) 05/06/2013   HGBA1C 12.1 (H) 01/28/2013   He is on: - Metformin 1000 mg 2x a day with meals - Jardiance 25 mg in am >> Farxiga 10 mg in a.m. - this is not covered by his insurance, but Anastasio Auerbach is He was previously on Basaglar 12 units at bedtime >> stopped 08/2016 >> restarted: 6-8 units 3x a day >> now off completely.  Pt checks his sugars once a day - am: 110s, 175 >> 110, 125-145 >> 115-140, 160 >> 120, 130-160 - 2h after b'fast: 146 >> 197 >> n/c  - before lunch:  102, 128 >> n/c - 2h after lunch: 1n/c >> 126 >> n/c - before dinner: n/c >> n/c - 2h after dinner:  110-140s >> 130-140 >> n/c - bedtime:  120-174 >> n/c - nighttime:  123-145, 170 >> n/c Lowest sugar was  110 >> 110 >> 115 >> 120. He  has hypoglycemia awareness in the 70s. Highest sugar was 175 - 2-3x >> 175 >> 160 >> 170.  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  Pt's meals are: - Breakfast: bagel, oatmeal, granola, eggs - Lunch: meat + 2-3 vegetables; soup; salad; sandwich - Dinner: meat + 2-3 vegetables; pasta; [place]; seafood - Snacks: 1 a day >> nuts, popcorn, dark chocolate  -No CKD, last  BUN/creatinine:  Lab Results  Component Value Date   BUN 20 11/04/2018   CREATININE 0.96 11/04/2018   Lab Results  Component Value Date   GFRNONAA >89 04/13/2015   No MAU: Lab Results  Component Value Date   MICRALBCREAT 0.6 11/04/2018   MICRALBCREAT 1.6 10/29/2017   MICRALBCREAT 0.8 10/26/2016   MICRALBCREAT 0.6 04/13/2015   MICRALBCREAT 0.7 05/11/2014   MICRALBCREAT 0.3 03/12/2013  On losartan.  -+ HL; reviewed lipids: Lab Results  Component Value Date   CHOL 151 11/04/2018   HDL 35.30 (L) 11/04/2018   LDLCALC 65 10/29/2017   LDLDIRECT 84.0 11/04/2018   TRIG 216.0 (H) 11/04/2018   CHOLHDL 4 11/04/2018  On Lipitor. - last eye exam was in 01/2018 at [facility] + glaucoma-stable. Dr. Edilia Bo.   -+ Numbness, but no tingling in his feet  ROS: Constitutional: no weight gain/no weight loss, no fatigue, no subjective hyperthermia, no subjective hypothermia Eyes: no blurry vision, no xerophthalmia ENT: no sore throat, no nodules palpated in neck, no dysphagia, no odynophagia, no hoarseness Cardiovascular: no CP/no SOB/no palpitations/no leg swelling Respiratory: no cough/no SOB/no wheezing Gastrointestinal: no N/no V/no D/no C/no acid reflux Musculoskeletal: no muscle aches/no joint aches Skin: no rashes, no hair loss Neurological: no tremors/+ numbness/no tingling/no dizziness  I reviewed pt's  medications, allergies, PMH, social hx, family hx, and changes were documented in the history of present illness. Otherwise, unchanged from my initial visit note.  Past Medical History:  Diagnosis Date  . Diabetes mellitus without complication ([place])   . Glaucoma   . Glaucoma   . Heart murmur    History of childhood heart murmur (Aortic)   Past Surgical History:  Procedure Laterality Date  . COLONOSCOPY  2006   Dr. Maddalyn Lutze Gong   Social History   Socioeconomic History  . Marital status: Married    Spouse name: Jackelyn Poling  . Number of children: 2  . Years of education: Not on file   . Highest education level: Not on file  Occupational History  . 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 Financial resource strain: Not on file  . Food insecurity    Worry: Not on file    Inability: Not on file  . Transportation needs    Medical: Not on file    Non-medical: Not on file  Tobacco Use  . Smoking status: Never Smoker  . Smokeless tobacco: Never Used  Substance and Sexual Activity  . Alcohol use: No  . Drug use: No  . Sexual activity: Not on file  Lifestyle  . Physical activity    Days per week: Not on file    Minutes per session: Not on file  . Stress: Not on file  Relationships  . Social Herbalist on phone: Not on file    Gets together: Not on file    Attends religious service: Not on file    Active member of club or organization: Not on file    Attends meetings of clubs or organizations: Not on file    Relationship status: Not on file  . Intimate partner violence    Fear of current or ex partner: Not on file    Emotionally abused: Not on file    Physically abused: Not on file    Forced sexual activity: Not on file  Other Topics Concern  . Not on file  Social History Narrative   Married 15 years   Wife - [name] 40   Daughter 67   Current Outpatient Medications on File Prior to Visit  Medication Sig Dispense Refill  . atorvastatin (LIPITOR) 20 MG tablet TAKE 1 TABLET (20 MG TOTAL) BY MOUTH DAILY 90 tablet 3  . b complex vitamins tablet Take 1 tablet by mouth daily. 100 tablet 3  . Blood Glucose Monitoring Suppl (ONETOUCH VERIO) W/DEVICE KIT 1 each by Does not apply route daily. 1 kit 0  . CVS D3 50 MCG (2000 UT) CAPS TAKE 1 CAPSULE BY MOUTH EVERY DAY 90 capsule 3  . dorzolamide-timolol (COSOPT) 22.3-6.8 MG/ML ophthalmic solution Place 1 drop into both eyes 2 (two) times daily.    Marland Kitchen FARXIGA 10 MG TABS tablet TAKE 10 MG BY MOUTH DAILY. 90 tablet 1  . Lancets (ONETOUCH DELICA PLUS [id number]) MISC  USE TO TEST BLOOD SUGAR 3 TIMES DAILY AS INSTRUCTED. 100 each 4  . latanoprost (XALATAN) 0.005 % ophthalmic solution Place 1 drop into both eyes at bedtime.     Marland Kitchen losartan (COZAAR) 25 MG tablet Take 1 tablet (25 mg total) by mouth daily. 90 tablet 3  . metFORMIN (GLUCOPHAGE) 500 MG tablet TAKE 2 TABLETS BY MOUTH TWICE A DAY WITH A MEAL 360 tablet 2  . ONETOUCH VERIO test strip USE  TO TEST BLOOD SUGAR 3 TIMES DAILY AS INSTRUCTED 100 each 2   Current Facility-Administered Medications on File Prior to Visit  Medication Dose Route Frequency Provider Last Rate Last Dose  . ketoconazole (NIZORAL) 2 % cream   Topical BID Plotnikov, Evie Lacks, MD       Allergies  Allergen Reactions  . Penicillins     As a child   Family History  Problem Relation Age of Onset  . Diabetes Mother   . Hypertension Mother   . Diabetes Father   . Heart disease Father   . Diabetes Brother   . Diabetes Sister     PE: BP 118/80   Pulse 78   Ht 6' 5" (1.956 m)   Wt 218 lb (98.9 kg)   SpO2 98%   BMI 25.85 kg/m  Body mass index is 25.85 kg/m.  Wt Readings from Last 3 Encounters:  11/11/18 218 lb (98.9 kg)  11/04/18 219 lb (99.3 kg)  05/20/18 216 lb (98 kg)   Constitutional: overweight, in NAD Eyes: PERRLA, EOMI, no exophthalmos ENT: moist mucous membranes, no thyromegaly, no cervical lymphadenopathy Cardiovascular: RRR, No MRG Respiratory: CTA B Gastrointestinal: abdomen soft, NT, ND, BS+ Musculoskeletal: no deformities, strength intact in all 4 Skin: moist, warm, no rashes Neurological: no tremor with outstretched hands, DTR normal in all 4  ASSESSMENT: 1. DM2, insulin-independent, controlled, with complications  - PN -stable  2. PN 2/2 diabetes  3. HL  PLAN:  1. Patient with well-controlled diabetes on oral medication regimen: Metformin and SGLT2 inhibitor, with sugars usually at goal except for very few hyperglycemic spikes.  We were able to stop insulin in the past due to improvement in his  sugars.  At last visit, HbA1c slightly higher at 6.1%, but still well within goal range.  He is tolerating well Metformin and Farxiga.  We discussed at last visit about possibly decreasing Metformin to only 1 tablet a day and in the future.  She did not try this.  I also advised him to decrease Farxiga to to 5 mg daily but he remains on 10 mg daily. -At this visit, sugars are higher in the morning, but he does not check later in the day so it is unclear whether his meter is not measuring his sugars correctly or his sugars are much lower later in the day.  I advised him to start checking sugars later in the day, rotating check times and let me know about these in approximately 2 weeks.  Given a log to fill out and sent back to me.  In the meantime, per insurance preference, we need to change his SGLT2 inhibitor from [place], to [place].  I advised him that this is a stronger medication so his sugars may improve after this change.  For now, we will continue Metformin twice a day but if sugars remain higher in the morning, we may need to move the entire dose at night. - I suggested to: Patient Instructions  Please  continue: - Metformin 1000 mg 2x a day with meals  Start: - Invokana 100 mg daily before breakfast instead of Farxiga  Please check sugars at different times of the day for the next 2 weeks and send me a picture of your log.  Please return in 6 months with your sugar log.   - we checked his HbA1c: 6.0% (lower) - advised to check sugars at different times of the day - 1x a day, rotating check times - advised for yearly  eye exams >> he is UTD - UTD with flu shot - return to clinic in 6 months    2. PN 2/2 DM  -He has numbness but no pain -He continues on alpha-lipoic acid and B complex and feels that these are helping  3. HL -Reviewed his lipid panel from earlier this month: LDL higher, but still at goal, triglycerides high, HDL low Lab Results  Component Value Date   CHOL 151  11/04/2018   HDL 35.30 (L) 11/04/2018   LDLCALC 65 10/29/2017   LDLDIRECT 84.0 11/04/2018   TRIG 216.0 (H) 11/04/2018   CHOLHDL 4 11/04/2018  -Continues Lipitor without side effects  Philemon Kingdom, MD PhD [facility] Endocrinology

## 2018-11-11 NOTE — Patient Instructions (Addendum)
Please  continue: - Metformin 1000 mg 2x a day with meals  Start: - Invokana 100 mg daily before breakfast instead of Farxiga  Please check sugars at different times of the day for the next 2 weeks and send me a picture of your log.  Please return in 6 months with your sugar log.

## 2018-11-14 ENCOUNTER — Encounter: Payer: BC Managed Care – PPO | Admitting: Internal Medicine

## 2018-11-26 ENCOUNTER — Other Ambulatory Visit: Payer: Self-pay | Admitting: Internal Medicine

## 2018-11-26 ENCOUNTER — Ambulatory Visit
Admission: RE | Admit: 2018-11-26 | Discharge: 2018-11-26 | Disposition: A | Discharge status: Home / Self Care | Payer: Self-pay | Source: Ambulatory Visit | Attending: Internal Medicine | Admitting: Internal Medicine

## 2018-11-26 ENCOUNTER — Other Ambulatory Visit: Payer: Self-pay

## 2018-11-26 ENCOUNTER — Encounter: Payer: Self-pay | Admitting: Internal Medicine

## 2018-11-26 ENCOUNTER — Telehealth: Payer: Self-pay | Admitting: *Deleted

## 2018-11-26 DIAGNOSIS — I2510 Atherosclerotic heart disease of native coronary artery without angina pectoris: Secondary | ICD-10-CM | POA: Insufficient documentation

## 2018-11-26 DIAGNOSIS — I712 Thoracic aortic aneurysm, without rupture: Secondary | ICD-10-CM

## 2018-11-26 DIAGNOSIS — E785 Hyperlipidemia, unspecified: Secondary | ICD-10-CM

## 2018-11-26 DIAGNOSIS — I7121 Aneurysm of the ascending aorta, without rupture: Secondary | ICD-10-CM | POA: Insufficient documentation

## 2018-11-26 NOTE — Telephone Encounter (Signed)
-----   Message from Sueanne Margarita, MD sent at 11/26/2018  1:15 PM EST ----- Patient has severe ascending aortic aneurysm on chest CT.  Please get in with me next week virtual or in person.   Traci

## 2018-11-26 NOTE — Telephone Encounter (Signed)
Spoke with pt and went over recommendation.  Scheduled pt to see Dr. Radford Pax 12/1 at 2:40pm. Pt verbalized understanding and was in agreement with this plan.

## 2018-12-02 ENCOUNTER — Telehealth: Payer: Self-pay | Admitting: Cardiology

## 2018-12-02 NOTE — Telephone Encounter (Signed)
I spoke to the patient who is calling because he is experiencing "chest tightness" and back pain.  He is not SOB, nor does pain radiate elsewhere in the body.  He has an appointment with Dr Radford Pax on 12/1, but was informed that if pain persists, he knows to go to the ED.  He verbalized understanding.

## 2018-12-02 NOTE — Telephone Encounter (Signed)
New Message  Pt c/o of Chest Pain: STAT if CP now or developed within 24 hours  1. Are you having CP right now? Yes  2. Are you experiencing any other symptoms (ex. SOB, nausea, vomiting, sweating)? Back Pain, Chest Tightness,   3. How long have you been experiencing CP? Late Yesterday  4. Is your CP continuous or coming and going? Coming and Going   5. Have you taken Nitroglycerin? No ?

## 2018-12-02 NOTE — Telephone Encounter (Signed)
I spoke to the patient and informed him that we are still under CoVid precautions and his wife would not be allowed to come up to the office for visit.  He verbalized understanding.

## 2018-12-03 ENCOUNTER — Ambulatory Visit (INDEPENDENT_AMBULATORY_CARE_PROVIDER_SITE_OTHER): Payer: BC Managed Care – PPO | Admitting: Cardiology

## 2018-12-03 ENCOUNTER — Encounter: Payer: Self-pay | Admitting: Cardiology

## 2018-12-03 ENCOUNTER — Other Ambulatory Visit: Payer: Self-pay

## 2018-12-03 VITALS — BP 144/88 | HR 74 | Ht 77.0 in | Wt 218.0 lb

## 2018-12-03 DIAGNOSIS — E7800 Pure hypercholesterolemia, unspecified: Secondary | ICD-10-CM | POA: Diagnosis not present

## 2018-12-03 DIAGNOSIS — I712 Thoracic aortic aneurysm, without rupture: Secondary | ICD-10-CM | POA: Diagnosis not present

## 2018-12-03 DIAGNOSIS — I10 Essential (primary) hypertension: Secondary | ICD-10-CM

## 2018-12-03 DIAGNOSIS — I7121 Aneurysm of the ascending aorta, without rupture: Secondary | ICD-10-CM

## 2018-12-03 DIAGNOSIS — I2510 Atherosclerotic heart disease of native coronary artery without angina pectoris: Secondary | ICD-10-CM | POA: Diagnosis not present

## 2018-12-03 DIAGNOSIS — R072 Precordial pain: Secondary | ICD-10-CM

## 2018-12-03 MED ORDER — ASPIRIN EC 81 MG PO TBEC: 81.0000 mg | DELAYED_RELEASE_TABLET | Freq: Every day | ORAL | 3 refills | Status: DC

## 2018-12-03 MED ORDER — METOPROLOL TARTRATE 100 MG PO TABS: 100.0000 mg | ORAL_TABLET | Freq: Once | ORAL | 0 refills | Status: DC

## 2018-12-03 MED ORDER — ATORVASTATIN CALCIUM 40 MG PO TABS: 40.0000 mg | ORAL_TABLET | Freq: Every day | ORAL | 3 refills | Status: DC

## 2018-12-03 NOTE — Progress Notes (Signed)
Cardiology Office Note    Date:  12/05/2018   ID:  Clifford Alvarez, DOB 1955-05-29, MRN [id number]  PCP:  Cassandria Anger, MD  Cardiologist:  Fransico Him, MD   Chief Complaint  Patient presents with  . New Patient (Initial Visit)    Coronary artery calcifications and aortic aneurysm    History of Present Illness:  Clifford Alvarez is a 63 y.o. male who is being seen today for the evaluation of coronary artery calcifications at the request of Plotnikov, Evie Lacks, MD.  This is a 63yo male with a hx of DM who is referred for evaluation of coronary calcium. Due to his cardiac risk factors (DM, and father with CAD), his PCP ordered a coronary calcium score to assess cardiac risk.   He denied any cardiac sx at that time.  His coronary calcium score was 11 which was 31st percentile for age and sec matched controls.  There was also noted to be a severely dilated ascending aortic aneurysm measuring 5.4cm in diameter.  He has no family hx of Aortic aneursyms that he is aware of.  He was told that he had a heart murmur in the past and he thinks someone mentioned aortic stenosis.  He has never had CP but since getting the results of his scan he has had some pressure and tightness across his chest.  He is not sure if it is just nerves from getting his dx or if something is wrong. He denies any SOB, DOE, PND, orthopnea, LE edema, dizziness, palpitations or syncope. He is compliant with his meds and is tolerating meds with no SE.    Past Medical History:  Diagnosis Date  . Ascending aortic aneurysm (HCC)    5.4cm ascending aorta by Chest CT 12/2018  . Coronary artery calcification seen on CAT scan    calcium score 11 12/2018  . Diabetes mellitus without complication ([place])   . Glaucoma   . Heart murmur    History of childhood heart murmur (Aortic)    Past Surgical History:  Procedure Laterality Date  . COLONOSCOPY  2006   Dr. Cristina Gong    Current Medications: Current Meds   Medication Sig  . b complex vitamins tablet Take 1 tablet by mouth daily.  . Blood Glucose Monitoring Suppl (ONETOUCH VERIO) W/DEVICE KIT 1 each by Does not apply route daily.  . canagliflozin (INVOKANA) 100 MG TABS tablet Take 1 tablet (100 mg total) by mouth daily before breakfast.  . [employer] D3 50 MCG (2000 UT) CAPS TAKE 1 CAPSULE BY MOUTH EVERY DAY  . dorzolamide-timolol (COSOPT) 22.3-6.8 MG/ML ophthalmic solution Place 1 drop into both eyes 2 (two) times daily.  . Lancets (ONETOUCH DELICA PLUS [id number]) MISC USE TO TEST BLOOD SUGAR 3 TIMES DAILY AS INSTRUCTED.  Marland Kitchen latanoprost (XALATAN) 0.005 % ophthalmic solution Place 1 drop into both eyes at bedtime.   Marland Kitchen losartan (COZAAR) 25 MG tablet Take 1 tablet (25 mg total) by mouth daily.  . metFORMIN (GLUCOPHAGE) 500 MG tablet TAKE 2 TABLETS BY MOUTH TWICE A DAY WITH A MEAL  . ONETOUCH VERIO test strip USE TO TEST BLOOD SUGAR 3 TIMES DAILY AS INSTRUCTED  . [DISCONTINUED] atorvastatin (LIPITOR) 20 MG tablet TAKE 1 TABLET (20 MG TOTAL) BY MOUTH DAILY   Current Facility-Administered Medications for the 12/03/18 encounter (Office Visit) with Sueanne Margarita, MD  Medication  . ketoconazole (NIZORAL) 2 % cream    Allergies:   Penicillins   Social History  Socioeconomic History  . Marital status: Married    Spouse name: Clifford Alvarez  . Number of children: 2  . Years of education: Not on file  . Highest education level: Not on file  Occupational History  . 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 Financial resource strain: Not on file  . Food insecurity    Worry: Not on file    Inability: Not on file  . Transportation needs    Medical: Not on file    Non-medical: Not on file  Tobacco Use  . Smoking status: Never Smoker  . Smokeless tobacco: Never Used  Substance and Sexual Activity  . Alcohol use: No  . Drug use: No  . Sexual activity: Not on file  Lifestyle  . Physical activity    Days per week:  Not on file    Minutes per session: Not on file  . Stress: Not on file  Relationships  . Social Herbalist on phone: Not on file    Gets together: Not on file    Attends religious service: Not on file    Active member of club or organization: Not on file    Attends meetings of clubs or organizations: Not on file    Relationship status: Not on file  Other Topics Concern  . Not on file  Social History Narrative   Married 66 years   Wife - Clifford Alvarez   Son 45   Daughter 96     Family History:  The patient's family history includes Diabetes in his brother, father, mother, and sister; Heart disease in his father; Hypertension in his mother.   ROS:   Please see the history of present illness.    ROS All other systems reviewed and are negative.  No flowsheet data found.     PHYSICAL EXAM:   VS:  BP (!) 144/88   Pulse 74   Ht 6' 5"  (1.956 m)   Wt 218 lb (98.9 kg)   SpO2 98%   BMI 25.85 kg/m    GEN: Well nourished, well developed, in no acute distress  HEENT: normal  Neck: no JVD, carotid bruits, or masses Cardiac: RRR; no murmurs, rubs, or gallops,no edema.  Intact distal pulses bilaterally.  Respiratory:  clear to auscultation bilaterally, normal work of breathing GI: soft, nontender, nondistended, + BS MS: no deformity or atrophy  Skin: warm and dry, no rash Neuro:  Alert and Oriented x 3, Strength and sensation are intact Psych: euthymic mood, full affect  Wt Readings from Last 3 Encounters:  12/03/18 218 lb (98.9 kg)  11/11/18 218 lb (98.9 kg)  11/04/18 219 lb (99.3 kg)      Studies/Labs Reviewed:   EKG:  EKG is ordered today.  The ekg ordered today demonstrates NSR with no ST/T changes  Recent Labs: 11/04/2018: ALT 17; BUN 20; Creatinine, Ser 0.96; Hemoglobin 15.5; Platelets 184.0; Potassium 4.3; Sodium 139; TSH 2.83   Lipid Panel    Component Value Date/Time   CHOL 151 11/04/2018 0827   TRIG 216.0 (H) 11/04/2018 0827   HDL 35.30 (L) 11/04/2018  0827   CHOLHDL 4 11/04/2018 0827   VLDL 43.2 (H) 11/04/2018 0827   LDLCALC 65 10/29/2017 0844   LDLDIRECT 84.0 11/04/2018 0827    Additional studies/ records that were reviewed today include:  Coronary calcium score    ASSESSMENT:    1. Coronary artery calcification seen on CAT scan  2. Essential hypertension   3. Pure hypercholesterolemia   4. Ascending aortic aneurysm ([place])   5. Precordial pain      PLAN:  In order of problems listed above:  1.  Coronary artery calcifications on CT -his calcium score is low but elevated at 11 -start ASA 45m daily -increase to full dose statin -he had not had any CP until this past week and is very active -I will get a coronary CTA to assess degree of CAD  2.  HTN -BP borderline controlled on exam but he says that this is very high for him and usually his SBP is around 1122mg -continue Losartan 2555maily -Creatinine stable at 0.96 a few weeks ago  3.  HLD -LDL goal < 70 -LDL was 84 a few weeks ago -increase Atorvastatin to 39m28mily  4.  Ascending aortic aneurysm -severely dilated at 5.4cm -his BP is well controlled -continue statin -refer ASAP to Dr. BartCyndia Bent aortic aneurysm -check 2D echo to assess for Bicuspid AV that can been seen with aortic aneursym (especially since he has had a heart murmur in the past and told he may problems with his AV) -I have encouraged him to let his siblings and children know that they should be screened for aneurysms  I have spent a total of 40 minutes with patient reviewing Chest CT, calcium score , telemetry, EKGs, labs and examining patient as well as establishing an assessment and plan that was discussed with the patient.  > 50% of time was spent in direct patient care and explaining results of studies to his wife and patient and lengthy conversation about treatment.     Medication Adjustments/Labs and Tests Ordered: Current medicines are reviewed at length with the patient today.   Concerns regarding medicines are outlined above.  Medication changes, Labs and Tests ordered today are listed in the Patient Instructions below.  Patient Instructions  Medication Instructions:  Your physician has recommended you make the following change in your medication:  1) Start aspirin 81 mg daily  2) Increase atorvastatin to 40 mg daily   *If you need a refill on your cardiac medications before your next appointment, please call your pharmacy*  Lab Work: Fasting lipids and ALT in 6 weeks.   If you have labs (blood work) drawn today and your tests are completely normal, you will receive your results only by: . MyMarland Kitchenhart Message (if you have MyChart) OR . A paper copy in the mail If you have any lab test that is abnormal or we need to change your treatment, we will call you to review the results.  Testing/Procedures: Your physician has requested that you have an echocardiogram. Echocardiography is a painless test that uses sound waves to create images of your heart. It provides your doctor with information about the size and shape of your heart and how well your heart's chambers and valves are working. This procedure takes approximately one hour. There are no restrictions for this procedure.  Cardiac CT Angiography (CTA), is a special type of CT scan that uses a computer to produce multi-dimensional views of major blood vessels throughout the body. In CT angiography, a contrast material is injected through an IV to help visualize the blood vessels  Follow-Up: [facility] and your [facility] needs are our priority.  As part of our continuing mission to provide you with exceptional heart care, we have created designated Provider Care Teams.  These Care Teams include your primary Cardiologist (physician) and  Advanced Practice Providers (APPs -  Physician Assistants and Nurse Practitioners) who all work together to provide you with the care you need, when you need it.  Your next  appointment:   3 month(s)  The format for your next appointment:   In Person  Provider:   Fransico Him, MD  Other Instructions  Your cardiac CT will be scheduled at:   [facility] [address] [place], [place] [address] [phone]   Please arrive at the [facility] main entrance of [facility] 30-45 minutes prior to test start time. Proceed to the [facility] Radiology Department (first floor) to check-in and test prep.   Please follow these instructions carefully (unless otherwise directed):  Hold all erectile dysfunction medications at least 3 days (72 hrs) prior to test.  On the Night Before the Test: . Be sure to Drink plenty of water. . Do not consume any caffeinated/decaffeinated beverages or chocolate 12 hours prior to your test. . Do not take any antihistamines 12 hours prior to your test.  On the Day of the Test: . Drink plenty of water. Do not drink any water within one hour of the test. . Do not eat any food 4 hours prior to the test. . You may take your regular medications prior to the test.  . Take metoprolol (Lopressor) two hours prior to test. . HOLD Furosemide/Hydrochlorothiazide morning of the test.      After the Test: . Drink plenty of water. . After receiving IV contrast, you may experience a mild flushed feeling. This is normal. . On occasion, you may experience a mild rash up to 24 hours after the test. This is not dangerous. If this occurs, you can take Benadryl 25 mg and increase your fluid intake. . If you experience trouble breathing, this can be serious. If it is severe call 911 IMMEDIATELY. If it is mild, please call our office. . If you take any of these medications: Glipizide/Metformin, Avandament, Glucavance, please do not take 48 hours after completing test unless otherwise instructed.   Once we have confirmed authorization from your insurance company, we will call you to set up a date and time for your test.    For non-scheduling related questions, please contact the cardiac imaging nurse navigator should you have any questions/concerns: Marchia Bond, RN Navigator Cardiac Imaging [facility] Services [phone] Office      Signed, Fransico Him, MD  12/05/2018 2:31 PM    [address], [place], [place]  [address] Phone: [phone]; Fax: [phone]

## 2018-12-03 NOTE — Patient Instructions (Addendum)
Medication Instructions:  Your physician has recommended you make the following change in your medication:  1) Start aspirin 81 mg daily  2) Increase atorvastatin to 40 mg daily   *If you need a refill on your cardiac medications before your next appointment, please call your pharmacy*  Lab Work: Fasting lipids and ALT in 6 weeks.   If you have labs (blood work) drawn today and your tests are completely normal, you will receive your results only by: Marland Kitchen MyChart Message (if you have MyChart) OR . A paper copy in the mail If you have any lab test that is abnormal or we need to change your treatment, we will call you to review the results.  Testing/Procedures: Your physician has requested that you have an echocardiogram. Echocardiography is a painless test that uses sound waves to create images of your heart. It provides your doctor with information about the size and shape of your heart and how well your heart's chambers and valves are working. This procedure takes approximately one hour. There are no restrictions for this procedure.  Cardiac CT Angiography (CTA), is a special type of CT scan that uses a computer to produce multi-dimensional views of major blood vessels throughout the body. In CT angiography, a contrast material is injected through an IV to help visualize the blood vessels  Follow-Up: At [facility], you and your health needs are our priority.  As part of our continuing mission to provide you with exceptional heart care, we have created designated Provider Care Teams.  These Care Teams include your primary Cardiologist (physician) and Advanced Practice Providers (APPs -  Physician Assistants and Nurse Practitioners) who all work together to provide you with the care you need, when you need it.  Your next appointment:   3 month(s)  The format for your next appointment:   In Person  Provider:   Fransico Him, MD  Other Instructions  Your cardiac CT will be scheduled  at:   [facility] [address] [place], [place] [address] [phone]   Please arrive at the [facility] main entrance of [facility] 30-45 minutes prior to test start time. Proceed to the [facility] Radiology Department (first floor) to check-in and test prep.   Please follow these instructions carefully (unless otherwise directed):  Hold all erectile dysfunction medications at least 3 days (72 hrs) prior to test.  On the Night Before the Test: . Be sure to Drink plenty of water. . Do not consume any caffeinated/decaffeinated beverages or chocolate 12 hours prior to your test. . Do not take any antihistamines 12 hours prior to your test.  On the Day of the Test: . Drink plenty of water. Do not drink any water within one hour of the test. . Do not eat any food 4 hours prior to the test. . You may take your regular medications prior to the test.  . Take metoprolol (Lopressor) two hours prior to test. . HOLD Furosemide/Hydrochlorothiazide morning of the test.      After the Test: . Drink plenty of water. . After receiving IV contrast, you may experience a mild flushed feeling. This is normal. . On occasion, you may experience a mild rash up to 24 hours after the test. This is not dangerous. If this occurs, you can take Benadryl 25 mg and increase your fluid intake. . If you experience trouble breathing, this can be serious. If it is severe call 911 IMMEDIATELY. If it is mild, please  call our office. . If you take any of these medications: Glipizide/Metformin, Avandament, Glucavance, please do not take 48 hours after completing test unless otherwise instructed.   Once we have confirmed authorization from your insurance company, we will call you to set up a date and time for your test.   For non-scheduling related questions, please contact the cardiac imaging nurse navigator should you have any questions/concerns: Marchia Bond, RN Navigator Cardiac  Imaging [facility] [phone] Office

## 2018-12-05 ENCOUNTER — Encounter: Payer: Self-pay | Admitting: Cardiology

## 2018-12-05 DIAGNOSIS — I2510 Atherosclerotic heart disease of native coronary artery without angina pectoris: Secondary | ICD-10-CM | POA: Insufficient documentation

## 2018-12-06 ENCOUNTER — Telehealth (HOSPITAL_COMMUNITY): Payer: Self-pay | Admitting: Emergency Medicine

## 2018-12-06 ENCOUNTER — Other Ambulatory Visit: Payer: Self-pay | Admitting: Emergency Medicine

## 2018-12-06 ENCOUNTER — Other Ambulatory Visit (HOSPITAL_COMMUNITY): Payer: Self-pay | Admitting: Emergency Medicine

## 2018-12-06 DIAGNOSIS — I712 Thoracic aortic aneurysm, without rupture, unspecified: Secondary | ICD-10-CM

## 2018-12-06 NOTE — Telephone Encounter (Signed)
Reaching out to patient to offer assistance regarding upcoming cardiac imaging study; pt verbalizes understanding of appt date/time, parking situation and where to check in, pre-test NPO status and medications ordered, and verified current allergies; name and call back number provided for further questions should they arise Josanne Boerema RN Navigator Cardiac Imaging [facility] [phone] office [phone] cell 

## 2018-12-08 ENCOUNTER — Other Ambulatory Visit: Payer: Self-pay | Admitting: Internal Medicine

## 2018-12-09 ENCOUNTER — Other Ambulatory Visit: Payer: Self-pay

## 2018-12-09 ENCOUNTER — Ambulatory Visit (HOSPITAL_COMMUNITY)
Admission: RE | Admit: 2018-12-09 | Discharge: 2018-12-09 | Disposition: A | Discharge status: Home / Self Care | Payer: BC Managed Care – PPO | Source: Ambulatory Visit | Attending: Cardiology | Admitting: Cardiology

## 2018-12-09 DIAGNOSIS — I712 Thoracic aortic aneurysm, without rupture, unspecified: Secondary | ICD-10-CM

## 2018-12-09 DIAGNOSIS — R072 Precordial pain: Secondary | ICD-10-CM | POA: Diagnosis not present

## 2018-12-09 MED ORDER — IOHEXOL 350 MG/ML SOLN
100.0000 mL | Freq: Once | INTRAVENOUS | Status: AC | PRN
  Administered 2018-12-09: 100 mL via INTRAVENOUS

## 2018-12-09 MED ORDER — NITROGLYCERIN 0.4 MG SL SUBL
SUBLINGUAL_TABLET | SUBLINGUAL | Status: AC
  Filled 2018-12-09: qty 2

## 2018-12-09 MED ORDER — NITROGLYCERIN 0.4 MG SL SUBL
0.8000 mg | SUBLINGUAL_TABLET | Freq: Once | SUBLINGUAL | Status: AC
  Administered 2018-12-09: 0.8 mg via SUBLINGUAL

## 2018-12-09 NOTE — Progress Notes (Signed)
CT scan completed. Tolerated well. D/C home walking, awake and alert. In no distress. 

## 2018-12-11 ENCOUNTER — Institutional Professional Consult (permissible substitution) (INDEPENDENT_AMBULATORY_CARE_PROVIDER_SITE_OTHER): Payer: BC Managed Care – PPO | Admitting: Surgery

## 2018-12-11 ENCOUNTER — Other Ambulatory Visit: Payer: Self-pay

## 2018-12-11 ENCOUNTER — Ambulatory Visit (HOSPITAL_COMMUNITY): Payer: BC Managed Care – PPO | Attending: Internal Medicine

## 2018-12-11 ENCOUNTER — Encounter: Payer: Self-pay | Admitting: Cardiology

## 2018-12-11 VITALS — BP 150/86 | HR 76 | Temp 97.7°F | Resp 20 | Ht 77.0 in | Wt 217.0 lb

## 2018-12-11 DIAGNOSIS — R072 Precordial pain: Secondary | ICD-10-CM | POA: Diagnosis not present

## 2018-12-11 DIAGNOSIS — I7121 Aneurysm of the ascending aorta, without rupture: Secondary | ICD-10-CM

## 2018-12-11 DIAGNOSIS — E7800 Pure hypercholesterolemia, unspecified: Secondary | ICD-10-CM

## 2018-12-11 DIAGNOSIS — I712 Thoracic aortic aneurysm, without rupture: Secondary | ICD-10-CM | POA: Insufficient documentation

## 2018-12-11 DIAGNOSIS — I10 Essential (primary) hypertension: Secondary | ICD-10-CM | POA: Diagnosis not present

## 2018-12-11 DIAGNOSIS — I2510 Atherosclerotic heart disease of native coronary artery without angina pectoris: Secondary | ICD-10-CM

## 2018-12-11 DIAGNOSIS — Q231 Congenital insufficiency of aortic valve: Secondary | ICD-10-CM | POA: Insufficient documentation

## 2018-12-12 ENCOUNTER — Encounter: Payer: BC Managed Care – PPO | Admitting: Surgery

## 2018-12-12 NOTE — Progress Notes (Signed)
Patient ID: Clifford Alvarez, male   DOB: June 27, 1955, 63 y.o.   MRN: [id number]  [facility]SULTATION REPORT  Referring Provider is Sueanne Margarita, MD Primary Cardiologist is Fransico Him, MD PCP is Plotnikov, Evie Lacks, MD  Chief Complaint  Patient presents with   Thoracic Aortic Aneurysm    surgical eval, CTA C/A/P, Cardiac CT 12/09/18, ECHO 12/11/18    HPI:  The patient is a 63 year old gentleman with a history of type 2 diabetes, history of a childhood heart murmur, and a family history of coronary artery disease who underwent a calcium scoring cardiac CT to assess his cardiac risk.  He has had no symptoms suggestive of cardiac disease.  His calcium score was 11 which was in the 31st percentile for age and sex.  This also showed a 5.4 cm ascending aortic aneurysm.  There is no family history of connective tissue disorder,  aortic aneurysm disease, or aortic dissection but he does note that there were members of his extended family that were 7 feet tall.  He had never had chest pain before but since getting the results of his CT scan he has had some left-sided pressure and tightness in his chest.  He has not had any shortness of breath lower extremity edema or dizziness.  He saw Dr. Radford Pax for cardiology evaluation and underwent a 2D echocardiogram which showed a bicuspid aortic valve with fusion of the right and left coronary cusp with a calcified raphae.  There is no aortic insufficiency.  The gradient across the aortic valve was not measured.  The aortic root measured 44 mm and the ascending aorta measured 53 mm.  There is no evidence of coarctation of aorta or aortic dissection.  Left ventricular ejection fraction was 57%.  There was trivial MR and no mitral stenosis.  A gated cardiac CTA showed enlargement of the aortic sinuses at 40 to 44 mm, and ST J of 47 mm, and an ascending aorta of 53 mm.  The proximal  aortic arch measured about 35 to 36 mm with the majority of the aortic arch being 30 x 29 mm.  The descending thoracic aorta measured 27 x 29 mm.  There was normal coronary origin with left dominance.  There were no significant coronary stenoses noted.  The left main was a very short artery that immediately gave rise to the LAD and left circumflex.  The patient is here today with his wife. 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 Two adult children.  Past Medical History:  Diagnosis Date   Ascending aortic aneurysm (HCC)    5.4cm ascending aorta by Chest CT 12/2018   Bicuspid aortic valve    no AS or AI on echo 12/2018   CAD (coronary artery disease), native coronary artery    minimal CAD of the pLAD and diagonal with 0-24% stenosis   Coronary artery calcification seen on CAT scan    calcium score 11 12/2018   Diabetes mellitus without complication (HCC)    Glaucoma    Heart murmur    History of childhood heart murmur (Aortic)    Past Surgical History:  Procedure Laterality Date   COLONOSCOPY  2006   Dr. Cristina Gong    Family History  Problem Relation Age of Onset   Diabetes Mother    Hypertension Mother    Diabetes Father    Heart disease Father    Diabetes Brother  Diabetes Sister     Social History   Socioeconomic History   Marital status: Married    Spouse name: Debbie   Number of children: 2   Years of education: Not on file   Highest education level: Not on file  Occupational History   Occupation: Self employed - Teacher, English as a foreign language    Comment: Programmer, multimedia  Tobacco Use   Smoking status: Never Smoker   Smokeless tobacco: Never Used  Substance and Sexual Activity   Alcohol use: No   Drug use: No   Sexual activity: Not on file  Other Topics Concern   Not on file  Social History Narrative   Married 55 years   Wife - [name] 28   Daughter 80   Social Determinants of Radio broadcast assistant Strain:     Difficulty of Paying Living Expenses: Not on file  Food Insecurity:    Worried About Charity fundraiser in the Last Year: Not on file   [employer] of Food in the Last Year: Not on file  Transportation Needs:    Film/video editor (Medical): Not on file   Lack of Transportation (Non-Medical): Not on file  Physical Activity:    Days of Exercise per Week: Not on file   Minutes of Exercise per Session: Not on file  Stress:    Feeling of Stress : Not on file  Social Connections:    Frequency of Communication with Friends and Family: Not on file   Frequency of Social Gatherings with Friends and Family: Not on file   Attends Religious Services: Not on file   Active Member of Clubs or Organizations: Not on file   Attends Archivist Meetings: Not on file   Marital Status: Not on file  Intimate Partner Violence:    Fear of Current or Ex-Partner: Not on file   Emotionally Abused: Not on file   Physically Abused: Not on file   Sexually Abused: Not on file    Current Outpatient Medications  Medication Sig Dispense Refill   Alpha-Lipoic Acid 600 MG TABS Take 2 capsules by mouth 2 (two) times daily.     aspirin EC 81 MG tablet Take 1 tablet (81 mg total) by mouth daily. 90 tablet 3   atorvastatin (LIPITOR) 40 MG tablet Take 1 tablet (40 mg total) by mouth daily at 6 PM. 90 tablet 3   b complex vitamins tablet Take 1 tablet by mouth daily. 100 tablet 3   Blood Glucose Monitoring Suppl (ONETOUCH VERIO) W/DEVICE KIT 1 each by Does not apply route daily. 1 kit 0   canagliflozin (INVOKANA) 100 MG TABS tablet Take 1 tablet (100 mg total) by mouth daily before breakfast. 90 tablet 3   [employer] D3 50 MCG (2000 UT) CAPS TAKE 1 CAPSULE BY MOUTH EVERY DAY 90 capsule 3   dorzolamide-timolol (COSOPT) 22.3-6.8 MG/ML ophthalmic solution Place 1 drop into both eyes 2 (two) times daily.     Lancets (ONETOUCH DELICA PLUS [id number]) MISC USE TO TEST BLOOD SUGAR 3 TIMES DAILY AS  INSTRUCTED. 100 each 4   latanoprost (XALATAN) 0.005 % ophthalmic solution Place 1 drop into both eyes at bedtime.      losartan (COZAAR) 25 MG tablet Take 1 tablet (25 mg total) by mouth daily. 90 tablet 3   metFORMIN (GLUCOPHAGE) 500 MG tablet TAKE 2 TABLETS BY MOUTH TWICE A DAY WITH A MEAL 360 tablet 2   ONETOUCH VERIO test strip USE TO TEST  BLOOD SUGAR 3 TIMES DAILY AS INSTRUCTED 100 each 2   metoprolol tartrate (LOPRESSOR) 100 MG tablet Take 1 tablet (100 mg total) by mouth once for 1 dose. Take 2 hours prior to CT. 1 tablet 0   Current Facility-Administered Medications  Medication Dose Route Frequency Provider Last Rate Last Admin   ketoconazole (NIZORAL) 2 % cream   Topical BID Plotnikov, Evie Lacks, MD        Allergies  Allergen Reactions   Penicillins     As a child      Review of Systems:   General:  normal appetite, normal energy, no weight gain, no weight loss, no fever  Cardiac:    + chest pain with exertion, + chest pain at rest, no SOB with no exertion, no resting SOB, no PND, no orthopnea, no palpitations, no arrhythmia, no atrial fibrillation, no LE edema, no dizzy spells, no syncope  Respiratory:  no shortness of breath, no home oxygen, no productive cough, no dry cough, no bronchitis, no wheezing, no hemoptysis, no asthma, no pain with inspiration or cough, no sleep apnea, no CPAP at night  GI:   no difficulty swallowing, no reflux, no frequent heartburn, no hiatal hernia, no abdominal pain, no constipation, no diarrhea, no hematochezia, no hematemesis, no melena  GU:   no dysuria,  no frequency, no urinary tract infection, no hematuria, no enlarged prostate, no kidney stones, no kidney disease  Vascular:  no pain suggestive of claudication, no pain in feet, no leg cramps, no varicose veins, no DVT, no non-healing foot ulcer  Neuro:   no stroke, no TIA's, no seizures, no headaches, no temporary blindness one eye,  no slurred speech, no peripheral neuropathy, no  chronic pain, no instability of gait, no memory/cognitive dysfunction  Musculoskeletal: no arthritis, no joint swelling, no myalgias, no difficulty walking, normal mobility   Skin:   no rash, no itching, no skin infections, no pressure sores or ulcerations  Psych:   no anxiety, no depression, no nervousness, no unusual recent stress  Eyes:   no blurry vision, no floaters, no recent vision changes, + wears glasses  ENT:   no hearing loss, no loose or painful teeth, no dentures, last saw dentist this year  Hematologic:  no easy bruising, no abnormal bleeding, no clotting disorder, no frequent epistaxis  Endocrine:  + diabetes           Physical Exam:   BP (!) 150/86    Pulse 76    Temp 97.7 F (36.5 C) (Skin)    Resp 20    Ht _0  (1.956 m)    Wt 217 lb (98.4 kg)    SpO2 98% Comment: RA   BMI 25.73 kg/m   General:  Fit and well-appearing  HEENT:  Unremarkable, NCAT, PERLA, EOMI,   Neck:   no JVD, no bruits, no adenopathy   Chest:   clear to auscultation, symmetrical breath sounds, no wheezes, no rhonchi   CV:   RRR, grade ll/VI crescendo/decrescendo murmur heard best at RSB,  no diastolic murmur  Abdomen:  soft, non-tender, no masses   Extremities:  warm, well-perfused, pulses palpable in feet, no LE edema  Rectal/GU  Deferred  Neuro:   Grossly non-focal and symmetrical throughout  Skin:   Clean and dry, no rashes, no breakdown   Diagnostic Tests:  ECHOCARDIOGRAM REPORT       Patient Name:   Clifford Alvarez Date of Exam: 12/11/2018 Medical Rec #:  [id number]  Height:       77.0 in Accession #:    [id number]           Weight:       218.0 lb Date of Birth:  [birth year]             BSA:          2.32 m Patient Age:    77 years             BP:           154/142 mmHg Patient Gender: M                    HR:           65 bpm. Exam Location:  [place]  Procedure: 2D Echo, 3D Echo, Cardiac Doppler and Color Doppler  Indications:    I71.2 Ascending Aortic  Aneurysm   History:        Patient has no prior history of Echocardiogram examinations.                 Risk Factors:Hypertension, HLD and Diabetes.   Sonographer:    Marygrace Drought RCS Referring Phys: [place]    1. Bicuspid aortic valve with raphe between right and left coronary cusps (Sievers Type 1.) Raphe is mildly calcified.  2. The aortic valve is bicuspid. Aortic valve regurgitation is not visualized. No evidence of aortic valve sclerosis or stenosis.  3. There is mild to moderate dilatation of the aortic root measuring 44 mm and severe dilation of the ascending aorta measuring 53 mm. Ascending aorta personally measured leading edge to leading edge. No evidence of coarctation of the aorta.  4. Left ventricular ejection fraction, by calculated 3D EF, is 57%. The left ventricle has normal function. Mildly increased left ventricular posterior wall thickness. There is mildly increased left ventricular hypertrophy.  5. The left ventricle has no regional wall motion abnormalities.  6. Global right ventricle has normal systolic function.The right ventricular size is normal. No increase in right ventricular wall thickness.  7. Left atrial size was normal.  8. Right atrial size was normal.  9. The mitral valve is normal in structure. Trivial mitral valve regurgitation. No evidence of mitral stenosis. 10. The tricuspid valve is normal in structure. Tricuspid valve regurgitation is trivial. 11. The pulmonic valve was normal in structure. Pulmonic valve regurgitation is not visualized.  FINDINGS  Left Ventricle: Left ventricular ejection fraction, by visual estimation, is 57%. The left ventricle has normal function. The left ventricle has no regional wall motion abnormalities. Mildly increased left ventricular posterior wall thickness. There is  mildly increased left ventricular hypertrophy. Eccentric left ventricular hypertrophy. Left ventricular diastolic parameters  were normal.  Right Ventricle: The right ventricular size is normal. No increase in right ventricular wall thickness. Global RV systolic function is has normal systolic function.  Left Atrium: Left atrial size was normal in size.  Right Atrium: Right atrial size was normal in size  Pericardium: There is no evidence of pericardial effusion.  Mitral Valve: The mitral valve is normal in structure. No evidence of mitral valve stenosis by observation. Trivial mitral valve regurgitation.  Tricuspid Valve: The tricuspid valve is normal in structure. Tricuspid valve regurgitation is trivial.  Aortic Valve: The aortic valve is bicuspid. Aortic valve regurgitation is not visualized. The aortic valve is structurally normal, with no evidence of sclerosis or stenosis. Bicuspid aortic valve with raphe between right and  left coronary cusps (Sievers  Type 1.) Raphe is mildly calcified.  Pulmonic Valve: The pulmonic valve was normal in structure. Pulmonic valve regurgitation is not visualized.  Aorta: The aortic root, ascending aorta and aortic arch are all structurally normal, with no evidence of dilitation or obstruction. There is mild to moderate dilatation of the aortic root and of the ascending aorta measuring 44 mm.  Venous: The inferior vena cava was not well visualized.  IAS/Shunts: No atrial level shunt detected by color flow Doppler. There is no evidence of a patent foramen ovale. No ventricular septal defect is seen or detected. There is no evidence of an atrial septal defect.    LEFT VENTRICLE PLAX 2D LVIDd:         5.09 cm  Diastology LVIDs:         3.70 cm  LV e' lateral:   13.40 cm/s LV PW:         1.22 cm  LV E/e' lateral: 5.7 LV IVS:        0.88 cm  LV e' medial:    10.00 cm/s LVOT diam:     2.80 cm  LV E/e' medial:  7.6 LV SV:         65 ml LV SV Index:   28.02 LVOT Area:     6.16 cm                           3D Volume EF:                         3D EF:        57  %                         LV EDV:       132 ml                         LV ESV:       57 ml                         LV SV:        75 ml  RIGHT VENTRICLE RV Basal diam:  3.91 cm RV S prime:     13.60 cm/s TAPSE (M-mode): 2.5 cm RVSP:           20.3 mmHg  LEFT ATRIUM             Index       RIGHT ATRIUM           Index LA diam:        3.70 cm 1.59 cm/m  RA Pressure: 3.00 mmHg LA Vol (A2C):   54.5 ml 23.49 ml/m RA Area:     17.90 cm LA Vol (A4C):   39.5 ml 17.02 ml/m RA Volume:   41.30 ml  17.80 ml/m LA Biplane Vol: 48.9 ml 21.08 ml/m  AORTIC VALVE LVOT Vmax:   82.80 cm/s LVOT Vmean:  56.800 cm/s LVOT VTI:    0.192 m   AORTA Ao Root diam: 4.40 cm Ao Asc diam:  5.30 cm  MITRAL VALVE                        TRICUSPID VALVE MV Area (PHT):  TR Peak grad:   17.3 mmHg MV PHT:                             TR Vmax:        208.00 cm/s MV Decel Time: 155 msec             Estimated RAP:  3.00 mmHg MV E velocity: 76.20 cm/s 103 cm/s  RVSP:           20.3 mmHg MV A velocity: 57.70 cm/s 70.3 cm/s MV E/A ratio:  1.32       1.5       SHUNTS                                     Systemic VTI:  0.19 m                                     Systemic Diam: 2.80 cm    Cherlynn Kaiser MD Electronically signed by Cherlynn Kaiser MD Signature Date/Time: 12/11/2018/11:28:45 AM    ADDENDUM REPORT: 12/09/2018 21:34  CLINICAL DATA:  63 year old male with DM, HTN and ascending aortic aneurysm.  EXAM: Cardiac TAVR CT  TECHNIQUE: The patient was scanned on a [employer]. A 120 kV retrospective scan was triggered in the descending thoracic aorta at 111 HU's. Gantry rotation speed was 250 msecs and collimation was .6 mm. 100 mg of PO Metoprolol and 0.8 mg of SL NTG were given. The 3D data set was reconstructed in 5% intervals of the R-R cycle. Systolic and diastolic phases were analyzed on a dedicated work station using MPR, MIP and VRT modes. The patient  received 80 cc of contrast.  FINDINGS: Aortic Valve: Bileaflet aortic valve with non-separated right and left coronary leaflets.  Aorta: There is aneurysmal dilatation of the aortic root including aortic sinuses, sinotubular junction, ascending aortic aneurysm with maximum diameter 53 mm. Normal size of the aortic arch and descending aorta. There is mild diffuse atherosclerotic plaque and calcifications and no dissection.  Sinotubular Junction: 47 x 44 mm  Ascending Thoracic Aorta: 53 x 52 mm  Proximal portion of the aortic arch: 36 x 35 mm  Aortic Arch: 30 x 29 mm  Descending Thoracic Aorta: 29 x 27 mm  Sinus of Valsalva Measurements:  Non-coronary: 44 mm  Right -coronary: 40 mm  Left -coronary: 41 mm  Coronary Arteries:  Normal coronary origin.  Left dominance.  Left main is a very short artery that immediately gives rise to LAD and LCX arteries.  LAD is a large vessel that gives rise to two diagonal arteries and has minimal calcified plaque in the proximal portion with stenosis 0-25%.  D1 is fairly small with minimal ostial calcified plaque with stenosis 0-25%.  D2 is very small.  LCX is a large dominant artery that gives rise to one large OM1 branch, PDA and a small PLA. There is minimal non-obstructive plaque.  RCA is a small non-dominant artery with no significant plaque.  Other findings:  Normal pulmonary vein drainage into the left atrium.  Normal left atrial appendage without a thrombus.  Moderately dilated pulmonary artery measuring 36 mm consistent with pulmonary hypertension.  IMPRESSION: 1. Calcium score previously reported 11 that represents 60th percentile for age /sex.  2. Normal  coronary origin. Left dominance. Minimal CAD in the proximal LAD and ostial portion of D1. CAD RADS 1.  3. There is aneurysmal dilatation of the aortic root including aortic sinuses, sinotubular junction, ascending aortic aneurysm  with maximum diameter 53 mm. Normal size of the aortic arch and descending aorta. There is mild diffuse atherosclerotic plaque and calcifications and no dissection.  4. Moderately dilated pulmonary artery measuring 36 mm consistent with pulmonary hypertension.   Electronically Signed   By: Ena Dawley   On: 12/09/2018 21:34   Addended by Dorothy Spark, MD on 12/09/2018 9:38 PM    Study Result  EXAM: OVER-READ INTERPRETATION  CT CHEST  The following report is an over-read performed by radiologist Dr. Vinnie Langton of [facility] [place] on 12/09/2018. This over-read does not include interpretation of cardiac or coronary anatomy or pathology. The coronary calcium score/coronary CTA interpretation by the cardiologist is attached.  COMPARISON:  None.  FINDINGS: Extracardiac findings will be described separately under dictation for contemporaneously obtained CTA chest, abdomen and pelvis.  IMPRESSION: 1. Please see separate dictation for contemporaneously obtained CTA chest, abdomen and pelvis for full description of relevant extracardiac findings.  Electronically Signed: By: Vinnie Langton M.D. On: 12/09/2018 13:22        Impression:  This 63 year old gentleman has a Sievers type I bicuspid aortic valve with a 5.3 cm fusiform ascending aortic aneurysm and an aortic sinus diameter of 41 to 44 mm.  His sinotubular junction is 47 x 44 mm.  There is no significant aortic stenosis or aortic insufficiency but his valve leaflets do appear thickened on echocardiogram and gated cardiac CTA with calcification of the raphae between the left and right cusp.  I think his aortic aneurysm should be repaired at this size given his bicuspid aortic valve and because he is only 68 and a low surgical risk.  This will minimize his risk of acute aortic dissection.  The options for surgery are replacing his aortic valve along with the aorta using a bioprosthetic valved  graft or performing valve sparing aortic root replacement.  There is some thickening and calcification of the valve leaflets which is a negative prognostic sign for long-term durability after valve sparing root replacement with a bicuspid valve.  I told him that the risk of significant structural valve deterioration requiring reintervention over the next 10 years after valve sparing root replacement with a bicuspid aortic valve is probably 20%.  I think a reasonable alternative is to replace his valve with a pericardial valved graft which will likely have better long-term durability and still obviate the need for oral anticoagulation.  I think this would also facilitate transcatheter aortic valve-in-valve replacement in the future if it was ever needed for structural valve deterioration.  TAVR in the future after valve sparing root replacement for a bicuspid valve may or may not be an option depending on the anatomy and TAVR is not currently a recommended treatment option for bicuspid aortic valve disease due to anatomical constraints.  I reviewed the echo and CTA images with the patient and his wife and answered all their questions.  We discussed the options and surgical procedures in some detail as well as the expected hospital course and recovery.  They are going to think about our discussion today and decide if they want to pursue a second opinion somewhere else or proceed with surgery here.  I told them that I did not think that surgery needed to be performed urgently but should  be performed in the next few months to minimize his risk of aortic dissection.  I gave them my contact number here and told him that I would be happy to see them again to discuss it further or call them to answer any questions that come up.   Plan:  The patient will call to schedule [place] procedure if he decides to proceed with surgery here.  I spent 60 minutes performing this consultation and > 50% of this time was spent face  to face counseling and coordinating the care of this patient's bicuspid aortic valve and ascending aortic aneurysm.   Gaye Pollack, MD 12/11/2018

## 2018-12-30 ENCOUNTER — Encounter: Payer: Self-pay | Admitting: Internal Medicine

## 2019-01-01 ENCOUNTER — Telehealth: Payer: Self-pay | Admitting: Cardiology

## 2019-01-01 NOTE — Telephone Encounter (Signed)
New message:   From Dr.Svensson concering patient imaging sent over night.

## 2019-01-01 NOTE — Telephone Encounter (Signed)
Suanne Marker from [facility] is calling to request CD images from [facility] echocardiogram for Dr. [name].

## 2019-01-07 DIAGNOSIS — Q231 Congenital insufficiency of aortic valve: Secondary | ICD-10-CM | POA: Diagnosis not present

## 2019-01-07 DIAGNOSIS — E8770 Fluid overload, unspecified: Secondary | ICD-10-CM | POA: Diagnosis not present

## 2019-01-07 DIAGNOSIS — Z7982 Long term (current) use of aspirin: Secondary | ICD-10-CM | POA: Diagnosis not present

## 2019-01-07 DIAGNOSIS — E119 Type 2 diabetes mellitus without complications: Secondary | ICD-10-CM | POA: Diagnosis not present

## 2019-01-07 DIAGNOSIS — J90 Pleural effusion, not elsewhere classified: Secondary | ICD-10-CM | POA: Diagnosis not present

## 2019-01-07 DIAGNOSIS — I2510 Atherosclerotic heart disease of native coronary artery without angina pectoris: Secondary | ICD-10-CM | POA: Diagnosis not present

## 2019-01-07 DIAGNOSIS — I491 Atrial premature depolarization: Secondary | ICD-10-CM | POA: Diagnosis not present

## 2019-01-07 DIAGNOSIS — Z794 Long term (current) use of insulin: Secondary | ICD-10-CM | POA: Diagnosis not present

## 2019-01-07 DIAGNOSIS — I358 Other nonrheumatic aortic valve disorders: Secondary | ICD-10-CM | POA: Diagnosis not present

## 2019-01-07 DIAGNOSIS — D689 Coagulation defect, unspecified: Secondary | ICD-10-CM | POA: Diagnosis not present

## 2019-01-07 DIAGNOSIS — Z01810 Encounter for preprocedural cardiovascular examination: Secondary | ICD-10-CM | POA: Diagnosis not present

## 2019-01-07 DIAGNOSIS — Z953 Presence of xenogenic heart valve: Secondary | ICD-10-CM | POA: Diagnosis not present

## 2019-01-07 DIAGNOSIS — E785 Hyperlipidemia, unspecified: Secondary | ICD-10-CM | POA: Diagnosis not present

## 2019-01-07 DIAGNOSIS — Z7984 Long term (current) use of oral hypoglycemic drugs: Secondary | ICD-10-CM | POA: Diagnosis not present

## 2019-01-07 DIAGNOSIS — I712 Thoracic aortic aneurysm, without rupture: Secondary | ICD-10-CM | POA: Diagnosis not present

## 2019-01-07 DIAGNOSIS — I359 Nonrheumatic aortic valve disorder, unspecified: Secondary | ICD-10-CM | POA: Diagnosis not present

## 2019-01-07 DIAGNOSIS — D696 Thrombocytopenia, unspecified: Secondary | ICD-10-CM | POA: Diagnosis not present

## 2019-01-07 DIAGNOSIS — I313 Pericardial effusion (noninflammatory): Secondary | ICD-10-CM | POA: Diagnosis not present

## 2019-01-07 DIAGNOSIS — I10 Essential (primary) hypertension: Secondary | ICD-10-CM | POA: Diagnosis not present

## 2019-01-07 DIAGNOSIS — Z20822 Contact with and (suspected) exposure to covid-19: Secondary | ICD-10-CM | POA: Diagnosis not present

## 2019-01-07 DIAGNOSIS — Z952 Presence of prosthetic heart valve: Secondary | ICD-10-CM | POA: Diagnosis not present

## 2019-01-07 DIAGNOSIS — E1165 Type 2 diabetes mellitus with hyperglycemia: Secondary | ICD-10-CM | POA: Diagnosis not present

## 2019-01-07 DIAGNOSIS — J9811 Atelectasis: Secondary | ICD-10-CM | POA: Diagnosis not present

## 2019-01-07 DIAGNOSIS — E1142 Type 2 diabetes mellitus with diabetic polyneuropathy: Secondary | ICD-10-CM | POA: Diagnosis not present

## 2019-01-07 DIAGNOSIS — R001 Bradycardia, unspecified: Secondary | ICD-10-CM | POA: Diagnosis not present

## 2019-01-07 DIAGNOSIS — Z9889 Other specified postprocedural states: Secondary | ICD-10-CM | POA: Diagnosis not present

## 2019-01-07 DIAGNOSIS — R9431 Abnormal electrocardiogram [ECG] [EKG]: Secondary | ICD-10-CM | POA: Diagnosis not present

## 2019-01-07 DIAGNOSIS — R0600 Dyspnea, unspecified: Secondary | ICD-10-CM | POA: Diagnosis not present

## 2019-01-07 DIAGNOSIS — E8779 Other fluid overload: Secondary | ICD-10-CM | POA: Diagnosis not present

## 2019-01-07 DIAGNOSIS — H409 Unspecified glaucoma: Secondary | ICD-10-CM | POA: Diagnosis not present

## 2019-01-08 DIAGNOSIS — Z01810 Encounter for preprocedural cardiovascular examination: Secondary | ICD-10-CM | POA: Diagnosis not present

## 2019-01-08 DIAGNOSIS — Q231 Congenital insufficiency of aortic valve: Secondary | ICD-10-CM | POA: Diagnosis not present

## 2019-01-08 DIAGNOSIS — I712 Thoracic aortic aneurysm, without rupture: Secondary | ICD-10-CM | POA: Diagnosis not present

## 2019-01-09 DIAGNOSIS — I712 Thoracic aortic aneurysm, without rupture: Secondary | ICD-10-CM | POA: Diagnosis not present

## 2019-01-10 DIAGNOSIS — Z9889 Other specified postprocedural states: Secondary | ICD-10-CM | POA: Diagnosis not present

## 2019-01-10 DIAGNOSIS — J9811 Atelectasis: Secondary | ICD-10-CM | POA: Diagnosis not present

## 2019-01-10 DIAGNOSIS — E8779 Other fluid overload: Secondary | ICD-10-CM | POA: Diagnosis not present

## 2019-01-10 DIAGNOSIS — J90 Pleural effusion, not elsewhere classified: Secondary | ICD-10-CM | POA: Diagnosis not present

## 2019-01-10 DIAGNOSIS — R001 Bradycardia, unspecified: Secondary | ICD-10-CM | POA: Diagnosis not present

## 2019-01-10 DIAGNOSIS — I313 Pericardial effusion (noninflammatory): Secondary | ICD-10-CM | POA: Diagnosis not present

## 2019-01-10 DIAGNOSIS — I358 Other nonrheumatic aortic valve disorders: Secondary | ICD-10-CM | POA: Diagnosis not present

## 2019-01-10 DIAGNOSIS — I712 Thoracic aortic aneurysm, without rupture: Secondary | ICD-10-CM | POA: Diagnosis not present

## 2019-01-10 DIAGNOSIS — R9431 Abnormal electrocardiogram [ECG] [EKG]: Secondary | ICD-10-CM | POA: Diagnosis not present

## 2019-01-10 DIAGNOSIS — I10 Essential (primary) hypertension: Secondary | ICD-10-CM | POA: Diagnosis not present

## 2019-01-10 HISTORY — PX: BENTALL PROCEDURE: SHX5058

## 2019-01-11 DIAGNOSIS — Z9889 Other specified postprocedural states: Secondary | ICD-10-CM | POA: Diagnosis not present

## 2019-01-11 DIAGNOSIS — J90 Pleural effusion, not elsewhere classified: Secondary | ICD-10-CM | POA: Diagnosis not present

## 2019-01-11 DIAGNOSIS — J9811 Atelectasis: Secondary | ICD-10-CM | POA: Diagnosis not present

## 2019-01-11 DIAGNOSIS — I712 Thoracic aortic aneurysm, without rupture: Secondary | ICD-10-CM | POA: Diagnosis not present

## 2019-01-11 DIAGNOSIS — E8779 Other fluid overload: Secondary | ICD-10-CM | POA: Diagnosis not present

## 2019-01-11 DIAGNOSIS — I10 Essential (primary) hypertension: Secondary | ICD-10-CM | POA: Diagnosis not present

## 2019-01-12 DIAGNOSIS — E119 Type 2 diabetes mellitus without complications: Secondary | ICD-10-CM | POA: Insufficient documentation

## 2019-01-12 DIAGNOSIS — E8770 Fluid overload, unspecified: Secondary | ICD-10-CM | POA: Insufficient documentation

## 2019-01-12 DIAGNOSIS — D72829 Elevated white blood cell count, unspecified: Secondary | ICD-10-CM | POA: Insufficient documentation

## 2019-01-12 DIAGNOSIS — G8918 Other acute postprocedural pain: Secondary | ICD-10-CM | POA: Insufficient documentation

## 2019-01-12 DIAGNOSIS — J9811 Atelectasis: Secondary | ICD-10-CM | POA: Insufficient documentation

## 2019-01-14 ENCOUNTER — Other Ambulatory Visit: Payer: BC Managed Care – PPO

## 2019-01-14 DIAGNOSIS — I3139 Other pericardial effusion (noninflammatory): Secondary | ICD-10-CM | POA: Insufficient documentation

## 2019-01-17 DIAGNOSIS — D72829 Elevated white blood cell count, unspecified: Secondary | ICD-10-CM | POA: Diagnosis not present

## 2019-01-17 DIAGNOSIS — Z09 Encounter for follow-up examination after completed treatment for conditions other than malignant neoplasm: Secondary | ICD-10-CM | POA: Diagnosis not present

## 2019-01-17 DIAGNOSIS — E8779 Other fluid overload: Secondary | ICD-10-CM | POA: Diagnosis not present

## 2019-01-17 DIAGNOSIS — I10 Essential (primary) hypertension: Secondary | ICD-10-CM | POA: Diagnosis not present

## 2019-01-17 DIAGNOSIS — R9431 Abnormal electrocardiogram [ECG] [EKG]: Secondary | ICD-10-CM | POA: Diagnosis not present

## 2019-01-17 DIAGNOSIS — J90 Pleural effusion, not elsewhere classified: Secondary | ICD-10-CM | POA: Diagnosis not present

## 2019-01-17 DIAGNOSIS — I313 Pericardial effusion (noninflammatory): Secondary | ICD-10-CM | POA: Diagnosis not present

## 2019-01-17 DIAGNOSIS — I712 Thoracic aortic aneurysm, without rupture: Secondary | ICD-10-CM | POA: Diagnosis not present

## 2019-01-23 ENCOUNTER — Telehealth (HOSPITAL_COMMUNITY): Payer: Self-pay

## 2019-01-23 NOTE — Telephone Encounter (Signed)
Attempted to contact patient to schedule new patient appointment for [facility]. No answer, left message for patient to contact office to schedule appt.

## 2019-01-23 NOTE — Telephone Encounter (Signed)
Pt returned call and has been scheduled for 01/24/2019.

## 2019-01-24 ENCOUNTER — Ambulatory Visit (HOSPITAL_COMMUNITY)
Admission: RE | Admit: 2019-01-24 | Discharge: 2019-01-24 | Disposition: A | Discharge status: Home / Self Care | Payer: BC Managed Care – PPO | Source: Ambulatory Visit | Attending: Internal Medicine | Admitting: Internal Medicine

## 2019-01-24 ENCOUNTER — Encounter (HOSPITAL_COMMUNITY): Payer: Self-pay | Admitting: Internal Medicine

## 2019-01-24 ENCOUNTER — Other Ambulatory Visit: Payer: Self-pay

## 2019-01-24 VITALS — BP 126/78 | HR 82 | Wt 205.4 lb

## 2019-01-24 DIAGNOSIS — I7121 Aneurysm of the ascending aorta, without rupture: Secondary | ICD-10-CM

## 2019-01-24 DIAGNOSIS — Z8249 Family history of ischemic heart disease and other diseases of the circulatory system: Secondary | ICD-10-CM | POA: Diagnosis not present

## 2019-01-24 DIAGNOSIS — I313 Pericardial effusion (noninflammatory): Secondary | ICD-10-CM | POA: Insufficient documentation

## 2019-01-24 DIAGNOSIS — J90 Pleural effusion, not elsewhere classified: Secondary | ICD-10-CM | POA: Diagnosis not present

## 2019-01-24 DIAGNOSIS — E119 Type 2 diabetes mellitus without complications: Secondary | ICD-10-CM | POA: Insufficient documentation

## 2019-01-24 DIAGNOSIS — Z7984 Long term (current) use of oral hypoglycemic drugs: Secondary | ICD-10-CM | POA: Diagnosis not present

## 2019-01-24 DIAGNOSIS — Q231 Congenital insufficiency of aortic valve: Secondary | ICD-10-CM | POA: Diagnosis not present

## 2019-01-24 DIAGNOSIS — I351 Nonrheumatic aortic (valve) insufficiency: Secondary | ICD-10-CM | POA: Diagnosis not present

## 2019-01-24 DIAGNOSIS — Z833 Family history of diabetes mellitus: Secondary | ICD-10-CM | POA: Diagnosis not present

## 2019-01-24 DIAGNOSIS — Z7982 Long term (current) use of aspirin: Secondary | ICD-10-CM | POA: Insufficient documentation

## 2019-01-24 DIAGNOSIS — I10 Essential (primary) hypertension: Secondary | ICD-10-CM | POA: Insufficient documentation

## 2019-01-24 DIAGNOSIS — Z79899 Other long term (current) drug therapy: Secondary | ICD-10-CM | POA: Diagnosis not present

## 2019-01-24 DIAGNOSIS — I2510 Atherosclerotic heart disease of native coronary artery without angina pectoris: Secondary | ICD-10-CM | POA: Insufficient documentation

## 2019-01-24 DIAGNOSIS — H409 Unspecified glaucoma: Secondary | ICD-10-CM | POA: Diagnosis not present

## 2019-01-24 DIAGNOSIS — I25119 Atherosclerotic heart disease of native coronary artery with unspecified angina pectoris: Secondary | ICD-10-CM

## 2019-01-24 DIAGNOSIS — Z953 Presence of xenogenic heart valve: Secondary | ICD-10-CM | POA: Diagnosis not present

## 2019-01-24 DIAGNOSIS — I712 Thoracic aortic aneurysm, without rupture: Secondary | ICD-10-CM | POA: Insufficient documentation

## 2019-01-24 DIAGNOSIS — Z880 Allergy status to penicillin: Secondary | ICD-10-CM | POA: Insufficient documentation

## 2019-01-24 LAB — COMPREHENSIVE METABOLIC PANEL
ALT: 29 U/L (ref 0–44)
AST: 22 U/L (ref 15–41)
Albumin: 3.3 g/dL — ABNORMAL LOW (ref 3.5–5.0)
Alkaline Phosphatase: 81 U/L (ref 38–126)
Anion gap: 10 (ref 5–15)
BUN: 12 mg/dL (ref 8–23)
CO2: 24 mmol/L (ref 22–32)
Calcium: 9.1 mg/dL (ref 8.9–10.3)
Chloride: 103 mmol/L (ref 98–111)
Creatinine, Ser: 1 mg/dL (ref 0.61–1.24)
GFR calc Af Amer: >60 mL/min (ref >60)
GFR calc non Af Amer: >60 mL/min (ref >60)
Glucose, Bld: 120 mg/dL — ABNORMAL HIGH (ref 70–99)
Potassium: 4.7 mmol/L (ref 3.5–5.1)
Sodium: 137 mmol/L (ref 135–145)
Total Bilirubin: 0.6 mg/dL (ref 0.3–1.2)
Total Protein: 7.1 g/dL (ref 6.5–8.1)

## 2019-01-24 LAB — CBC
HCT: 37.1 % — ABNORMAL LOW (ref 39.0–52.0)
Hemoglobin: 11.5 g/dL — ABNORMAL LOW (ref 13.0–17.0)
MCH: 29.7 pg (ref 26.0–34.0)
MCHC: 31 g/dL (ref 30.0–36.0)
MCV: 95.9 fL (ref 80.0–100.0)
Platelets: 513 10*3/uL — ABNORMAL HIGH (ref 150–400)
RBC: 3.87 MIL/uL — ABNORMAL LOW (ref 4.22–5.81)
RDW: 12.8 % (ref 11.5–15.5)
WBC: 10.6 10*3/uL — ABNORMAL HIGH (ref 4.0–10.5)
nRBC: 0 % (ref 0.0–0.2)

## 2019-01-24 NOTE — Patient Instructions (Signed)
Routine lab work today. Will notify you of abnormal results  Your provider requests you have a chest x ray today.  You are scheduled for an echocardiogram next week.  Follow up in 4 weeks.

## 2019-01-24 NOTE — Progress Notes (Signed)
Advanced [facility] Failure Clinic Note   Referring Physician: PCP: Cassandria Anger, MD   HPI:  Clifford Alvarez is a 64 y/o male with h/o HTN, DM2 who was recently found to have bicuspid aortic valve with dilated aortic root. Underwent Bentall aortic root replacement with bioprosthetic AVR on 01/10/19 at the [facility].  Pre-op cardiac CTA. No significant CAD Calcium score 11  Post-op course relatively uncomplicated. Had moderate pericardia effusion on post-op echo and started on colchicine.   Now doing well. Doing ADLs without problem. No CP or SOB. No edema, fevers or chills. Has left ulnar neuropathy.   Past Medical History:  Diagnosis Date  . Ascending aortic aneurysm (HCC)    5.4cm ascending aorta by Chest CT 12/2018  . Bicuspid aortic valve    no AS or AI on echo 12/2018  . CAD (coronary artery disease), native coronary artery    minimal CAD of the pLAD and diagonal with 0-24% stenosis  . Coronary artery calcification seen on CAT scan    calcium score 11 12/2018  . Diabetes mellitus without complication ([place])   . Glaucoma   . Heart murmur    History of childhood heart murmur (Aortic)    Current Outpatient Medications  Medication Sig Dispense Refill  . aspirin EC 81 MG tablet Take 1 tablet (81 mg total) by mouth daily. 90 tablet 3  . atorvastatin (LIPITOR) 40 MG tablet Take 1 tablet (40 mg total) by mouth daily at 6 PM. 90 tablet 3  . colchicine 0.6 MG tablet Take 0.6 mg by mouth daily.    . CVS D3 50 MCG (2000 UT) CAPS TAKE 1 CAPSULE BY MOUTH EVERY DAY 90 capsule 3  . dorzolamide-timolol (COSOPT) 22.3-6.8 MG/ML ophthalmic solution Place 1 drop into both eyes 2 (two) times daily.    . ferrous sulfate 325 (65 FE) MG EC tablet Take 325 mg by mouth 2 (two) times daily.    Marland Kitchen latanoprost (XALATAN) 0.005 % ophthalmic solution Place 1 drop into both eyes at bedtime.     . metFORMIN (GLUCOPHAGE) 500 MG tablet TAKE 2 TABLETS BY MOUTH TWICE A DAY WITH A MEAL 360 tablet 2  .  metoprolol tartrate (LOPRESSOR) 25 MG tablet Take 25 mg by mouth 2 (two) times daily.    . sitaGLIPtin (JANUVIA) 100 MG tablet Take 100 mg by mouth daily.     Current Facility-Administered Medications  Medication Dose Route Frequency Provider Last Rate Last Admin  . ketoconazole (NIZORAL) 2 % cream   Topical BID Plotnikov, Evie Lacks, MD        Allergies  Allergen Reactions  . Penicillins     As a child      Social History   Socioeconomic History  . Marital status: Married    Spouse name: Jackelyn Poling  . Number of children: 2  . Years of education: Not on file  . Highest education level: Not on file  Occupational History  . 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 Smoking status: Never Smoker  . Smokeless tobacco: Never Used  Substance and Sexual Activity  . Alcohol use: No  . Drug use: No  . Sexual activity: Not on file  Other Topics Concern  . Not on file  Social History Narrative   Married 65 years   Wife - [name] 53   Daughter 72   Social Determinants of Radio broadcast assistant Strain:   . Difficulty  of Paying Living Expenses: Not on file  Food Insecurity:   . Worried About Programme researcher, broadcasting/film/video in the Last Year: Not on file  . Ran Out of Food in the Last Year: Not on file  Transportation Needs:   . Lack of Transportation (Medical): Not on file  . Lack of Transportation (Non-Medical): Not on file  Physical Activity:   . Days of Exercise per Week: Not on file  . Minutes of Exercise per Session: Not on file  Stress:   . Feeling of Stress : Not on file  Social Connections:   . Frequency of Communication with Friends and Family: Not on file  . Frequency of Social Gatherings with Friends and Family: Not on file  . Attends Religious Services: Not on file  . Active Member of Clubs or Organizations: Not on file  . Attends Banker Meetings: Not on file  . Marital Status: Not on file  Intimate Partner  Violence:   . Fear of Current or Ex-Partner: Not on file  . Emotionally Abused: Not on file  . Physically Abused: Not on file  . Sexually Abused: Not on file      Family History  Problem Relation Age of Onset  . Diabetes Mother   . Hypertension Mother   . Diabetes Father   . Heart disease Father   . Diabetes Brother   . Diabetes Sister     Vitals:   01/24/19 1032  BP: 126/78  Pulse: 82  SpO2: 98%  Weight: 93.2 kg (205 lb 6 oz)     PHYSICAL EXAM: General:  Well appearing. Mildly pale No respiratory difficulty HEENT: normal Neck: supple. no JVD. Carotids 2+ bilat; no bruits. No lymphadenopathy or thyromegaly appreciated. Cor: Sternum wellh-ealed  PMI nondisplaced. Regular rate & rhythm. No rubs, gallops or murmurs. Lungs: clear Abdomen: soft, nontender, nondistended. No hepatosplenomegaly. No bruits or masses. Good bowel sounds. Extremities: no cyanosis, clubbing, rash, edema Neuro: alert & oriented x 3, cranial nerves grossly intact. moves all 4 extremities w/o difficulty. Affect pleasant.  ECG: NSR 77 LVH with diffuse TWI. Personally reviewed  ASSESSMENT & PLAN:  1. Bicuspid aortic valve with dilated aortic root.  - s/p Bentall aortic root replacement with bioprosthetic AVR on 01/10/19 at the [facility] - doing well. Sternum looks good.  - check CXR and repeat echo to f/u on post-op effusion - refer CR when COVID permits - encourage activity  2. Post-op pericardial effusion - recheck echo - continue colchicine  Arvilla Meres, MD 01/24/19

## 2019-01-29 ENCOUNTER — Ambulatory Visit (HOSPITAL_COMMUNITY)
Admission: RE | Admit: 2019-01-29 | Discharge: 2019-01-29 | Disposition: A | Discharge status: Home / Self Care | Payer: BC Managed Care – PPO | Source: Ambulatory Visit | Attending: Internal Medicine | Admitting: Internal Medicine

## 2019-01-29 ENCOUNTER — Other Ambulatory Visit: Payer: Self-pay

## 2019-01-29 DIAGNOSIS — Z952 Presence of prosthetic heart valve: Secondary | ICD-10-CM | POA: Insufficient documentation

## 2019-01-29 DIAGNOSIS — I119 Hypertensive heart disease without heart failure: Secondary | ICD-10-CM | POA: Diagnosis not present

## 2019-01-29 DIAGNOSIS — I313 Pericardial effusion (noninflammatory): Secondary | ICD-10-CM | POA: Insufficient documentation

## 2019-01-29 DIAGNOSIS — I351 Nonrheumatic aortic (valve) insufficiency: Secondary | ICD-10-CM

## 2019-01-29 DIAGNOSIS — I2510 Atherosclerotic heart disease of native coronary artery without angina pectoris: Secondary | ICD-10-CM | POA: Insufficient documentation

## 2019-01-29 DIAGNOSIS — E119 Type 2 diabetes mellitus without complications: Secondary | ICD-10-CM | POA: Diagnosis not present

## 2019-01-29 NOTE — Progress Notes (Signed)
  Echocardiogram 2D Echocardiogram has been performed.  Gerda Diss 01/29/2019, 3:21 PM

## 2019-02-05 ENCOUNTER — Encounter (HOSPITAL_COMMUNITY): Payer: Self-pay

## 2019-02-05 ENCOUNTER — Encounter (HOSPITAL_COMMUNITY): Payer: BC Managed Care – PPO | Admitting: Internal Medicine

## 2019-02-05 ENCOUNTER — Other Ambulatory Visit (HOSPITAL_COMMUNITY): Payer: Self-pay | Admitting: *Deleted

## 2019-02-05 MED ORDER — METOPROLOL TARTRATE 25 MG PO TABS: 25.0000 mg | ORAL_TABLET | Freq: Two times a day (BID) | ORAL | 3 refills | Status: DC

## 2019-02-06 ENCOUNTER — Encounter: Payer: Self-pay | Admitting: Cardiology

## 2019-02-09 ENCOUNTER — Other Ambulatory Visit: Payer: Self-pay | Admitting: Internal Medicine

## 2019-02-10 ENCOUNTER — Other Ambulatory Visit: Payer: Self-pay

## 2019-02-10 ENCOUNTER — Encounter (HOSPITAL_COMMUNITY): Payer: Self-pay

## 2019-02-10 ENCOUNTER — Encounter (HOSPITAL_COMMUNITY): Payer: BC Managed Care – PPO | Admitting: Internal Medicine

## 2019-02-12 ENCOUNTER — Encounter: Payer: Self-pay | Admitting: Internal Medicine

## 2019-02-12 ENCOUNTER — Other Ambulatory Visit: Payer: Self-pay

## 2019-02-12 ENCOUNTER — Ambulatory Visit (INDEPENDENT_AMBULATORY_CARE_PROVIDER_SITE_OTHER): Payer: BC Managed Care – PPO | Admitting: Internal Medicine

## 2019-02-12 VITALS — BP 122/70 | HR 71 | Ht 77.0 in | Wt 204.0 lb

## 2019-02-12 DIAGNOSIS — G63 Polyneuropathy in diseases classified elsewhere: Secondary | ICD-10-CM | POA: Diagnosis not present

## 2019-02-12 DIAGNOSIS — E785 Hyperlipidemia, unspecified: Secondary | ICD-10-CM

## 2019-02-12 DIAGNOSIS — E1142 Type 2 diabetes mellitus with diabetic polyneuropathy: Secondary | ICD-10-CM | POA: Diagnosis not present

## 2019-02-12 LAB — POCT GLYCOSYLATED HEMOGLOBIN (HGB A1C): Hemoglobin A1C: 6 % — AB (ref 4.0–5.6)

## 2019-02-12 MED ORDER — FARXIGA 5 MG PO TABS: 5.0000 mg | ORAL_TABLET | Freq: Every day | ORAL | 3 refills | Status: DC

## 2019-02-12 NOTE — Progress Notes (Signed)
Patient ID: Clifford Alvarez, male   DOB: 05-24-1955, 64 y.o.   MRN: [id number]  This visit occurred during the SARS-CoV-2 public health emergency.  Safety protocols were in place, including screening questions prior to the visit, additional usage of staff PPE, and extensive cleaning of exam room while observing appropriate contact time as indicated for disinfecting solutions.   HPI: Clifford Alvarez is a 64 y.o.-year-old male, returning for f/u for DM2 dx 01/28/2013, previously insulin-dependent, now off insulin, controlled, with complications (Peripheral neuropathy). Last visit was 3 months ago. He changed his insurance before last visit.  Since last visit, he was found to have a thoracic aortic aneurysm and had surgery for this at [facility] on 01/10/2019.  He contacted me for high blood sugars.  I recommended to restart a low-dose insulin but he wanted to try increase Invokana dose 1st.  This worked on improving his sugars, but he was switched to [place] at [facility] until he comes to see me.  Reviewed HbA1c levels: Lab Results  Component Value Date   HGBA1C 6.0 (A) 11/11/2018   HGBA1C 6.1 (A) 05/20/2018   HGBA1C 5.8 (A) 11/19/2017   HGBA1C 6.0 05/14/2017   HGBA1C 6 01/08/2017   HGBA1C 6.3 10/26/2016   HGBA1C 5.7 08/07/2016   HGBA1C 6.0 04/17/2016   HGBA1C 6.0 12/13/2015   HGBA1C 5.9 08/12/2015   HGBA1C 5.9 04/13/2015   HGBA1C 5.8 12/15/2014   HGBA1C 5.9 08/17/2014   HGBA1C 6.1 05/11/2014   HGBA1C 6.3 02/11/2014   HGBA1C 6.1 11/10/2013   HGBA1C 6.5 08/08/2013   HGBA1C 6.9 (H) 05/06/2013   HGBA1C 12.1 (H) 01/28/2013   He is on: - Metformin 1000 mg 2x a day with meals - Jardiance 25 mg in am >> Farxiga 10 mg in a.m. >> Invokana 100,  then 300 mg daily - changed 11/2018 per insurance preference >> but stopped 2-3 weeks ago  - he was switched to Januvia 100 by the cardiologist at [facility] He was previously on Basaglar 12 units at bedtime >> stopped  08/2016 >> restarted: 6-8 units 3x a day >> now off completely.  Pt checks his sugars once a day-improved after his surgery: - am:  115-140, 160 >> 120, 130-160 >> 119-144, 155 - 2h after b'fast: 146 >> 197 >> n/c  - before lunch:  102, 128 >> n/c - 2h after lunch: 1n/c >> 126 >> n/c - before dinner: n/c >> n/c >> 124-142 - 2h after dinner:  110-140s >> 130-140 >> n/c - bedtime:  120-174 >> n/c - nighttime:  123-145, 170 >> n/c Lowest sugar was 115 >> 120 >> 119.  He  has hypoglycemia awareness in the seventies. Highest sugar was 160 >> 170 >> 168.  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  Pt's meals are: - Breakfast: bagel, oatmeal, granola, eggs - Lunch: meat + 2-3 vegetables; soup; salad; sandwich - Dinner: meat + 2-3 vegetables; pasta; [place]; seafood - Snacks: 1 a day >> nuts, popcorn, dark chocolate  -No CKD, last BUN/creatinine:  Lab Results  Component Value Date   BUN 12 01/24/2019   CREATININE 1.00 01/24/2019   Lab Results  Component Value Date   GFRNONAA >60 01/24/2019   GFRNONAA >89 04/13/2015   No MAU: Lab Results  Component Value Date   MICRALBCREAT 0.6 11/04/2018   MICRALBCREAT 1.6 10/29/2017   MICRALBCREAT 0.8 10/26/2016   MICRALBCREAT 0.6 04/13/2015   MICRALBCREAT 0.7 05/11/2014   MICRALBCREAT 0.3 03/12/2013  On losartan.  -+  HL; reviewed lipids: Lab Results  Component Value Date   CHOL 151 11/04/2018   HDL 35.30 (L) 11/04/2018   LDLCALC 65 10/29/2017   LDLDIRECT 84.0 11/04/2018   TRIG 216.0 (H) 11/04/2018   CHOLHDL 4 11/04/2018  On Lipitor 40. - last eye exam was in 01/2018 at [facility]: + Glaucoma, no DR. Dr. Edilia Bo.   -He has numbness but no tingling in his feet  ROS: Constitutional: no weight gain/+ weight loss, no fatigue, no subjective hyperthermia, no subjective hypothermia Eyes: no blurry vision, no xerophthalmia ENT: no sore throat, no nodules palpated in neck, no dysphagia, no odynophagia, no hoarseness Cardiovascular: no CP/no  SOB/no palpitations/no leg swelling Respiratory: no cough/no SOB/no wheezing Gastrointestinal: no N/no V/no D/no C/no acid reflux Musculoskeletal: no muscle aches/no joint aches Skin: no rashes, no hair loss Neurological: no tremors/+ numbness/no tingling/no dizziness  I reviewed pt's medications, allergies, PMH, social hx, family hx, and changes were documented in the history of present illness. Otherwise, unchanged from my initial visit note.  Past Medical History:  Diagnosis Date  . Ascending aortic aneurysm (HCC)    5.4cm ascending aorta by Chest CT 12/2018, s/p thoracic aortic aneurysm repair with Biobentall 17mm Perimount Valve, 56mm graft  . Bicuspid aortic valve    no AS or AI on echo 12/2018  . CAD (coronary artery disease), native coronary artery    minimal CAD of the pLAD and diagonal with 0-24% stenosis  . Coronary artery calcification seen on CAT scan    calcium score 11 12/2018  . Diabetes mellitus without complication ([place])   . Glaucoma   . Heart murmur    History of childhood heart murmur (Aortic)   Past Surgical History:  Procedure Laterality Date  . BENTALL PROCEDURE  01/10/2019   at [facility]  . COLONOSCOPY  2006   Dr. Edison Wollschlager Gong   Social History   Socioeconomic History  . Marital status: Married    Spouse name: Jackelyn Poling  . Number of children: 2  . Years of education: Not on file  . Highest education level: Not on file  Occupational History  . 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 Smoking status: Never Smoker  . Smokeless tobacco: Never Used  Substance and Sexual Activity  . Alcohol use: No  . Drug use: No  . Sexual activity: Not on file  Other Topics Concern  . Not on file  Social History Narrative   Married 2 years   Wife - [name] 39   Daughter 68   Social Determinants of Radio broadcast assistant Strain:   . Difficulty of Paying Living Expenses: Not on file  Food Insecurity:   .  Worried About Charity fundraiser in the Last Year: Not on file  . Ran Out of Food in the Last Year: Not on file  Transportation Needs:   . Lack of Transportation (Medical): Not on file  . Lack of Transportation (Non-Medical): Not on file  Physical Activity:   . Days of Exercise per Week: Not on file  . Minutes of Exercise per Session: Not on file  Stress:   . Feeling of Stress : Not on file  Social Connections:   . Frequency of Communication with Friends and Family: Not on file  . Frequency of Social Gatherings with Friends and Family: Not on file  . Attends Religious Services: Not on file  . Active Member of Clubs or  Organizations: Not on file  . Attends Banker Meetings: Not on file  . Marital Status: Not on file  Intimate Partner Violence:   . Fear of Current or Ex-Partner: Not on file  . Emotionally Abused: Not on file  . Physically Abused: Not on file  . Sexually Abused: Not on file   Current Outpatient Medications on File Prior to Visit  Medication Sig Dispense Refill  . aspirin EC 81 MG tablet Take 1 tablet (81 mg total) by mouth daily. 90 tablet 3  . atorvastatin (LIPITOR) 40 MG tablet Take 1 tablet (40 mg total) by mouth daily at 6 PM. 90 tablet 3  . colchicine 0.6 MG tablet Take 0.6 mg by mouth daily.    . CVS D3 50 MCG (2000 UT) CAPS TAKE 1 CAPSULE BY MOUTH EVERY DAY 90 capsule 3  . dorzolamide-timolol (COSOPT) 22.3-6.8 MG/ML ophthalmic solution Place 1 drop into both eyes 2 (two) times daily.    . ferrous sulfate 325 (65 FE) MG EC tablet Take 325 mg by mouth 2 (two) times daily.    Marland Kitchen latanoprost (XALATAN) 0.005 % ophthalmic solution Place 1 drop into both eyes at bedtime.     . metFORMIN (GLUCOPHAGE) 500 MG tablet TAKE 2 TABLETS BY MOUTH TWICE A DAY WITH A MEAL 360 tablet 2  . metoprolol tartrate (LOPRESSOR) 25 MG tablet Take 1 tablet (25 mg total) by mouth 2 (two) times daily. 60 tablet 3  . sitaGLIPtin (JANUVIA) 100 MG tablet Take 100 mg by mouth daily.      Current Facility-Administered Medications on File Prior to Visit  Medication Dose Route Frequency Provider Last Rate Last Admin  . ketoconazole (NIZORAL) 2 % cream   Topical BID Plotnikov, Georgina Quint, MD       Allergies  Allergen Reactions  . Penicillins     As a child   Family History  Problem Relation Age of Onset  . Diabetes Mother   . Hypertension Mother   . Diabetes Father   . Heart disease Father   . Diabetes Brother   . Diabetes Sister     PE: BP 122/70   Pulse 71   Ht 6\' 5"  (1.956 m)   Wt 204 lb (92.5 kg)   SpO2 98%   BMI 24.19 kg/m  Body mass index is 24.19 kg/m.  Wt Readings from Last 3 Encounters:  02/12/19 204 lb (92.5 kg)  01/24/19 205 lb 6 oz (93.2 kg)  12/11/18 217 lb (98.4 kg)   Constitutional: overweight, in NAD Eyes: PERRLA, EOMI, no exophthalmos ENT: moist mucous membranes, no thyromegaly, no cervical lymphadenopathy Cardiovascular: RRR, No MRG Respiratory: CTA B Gastrointestinal: abdomen soft, NT, ND, BS+ Musculoskeletal: no deformities, strength intact in all 4 Skin: moist, warm, no rashes Neurological: no tremor with outstretched hands, DTR normal in all 4  ASSESSMENT: 1. DM2, insulin-independent, controlled, with complications  - PN -stable  2. PN 2/2 diabetes  3. HL  PLAN:  1. Patient with controlled diabetes on oral medication regimen with Metformin and SGLT 2 inhibitor.  His sugars are usually at goal except for few hyperglycemic spikes.  At last visit HbA1c was 6%, excellent.  However, afterwards, his sugars worsened and, in preparation for his aortic aneurysm, we discussed about restarting a low-dose insulin.  However, he wanted to try increasing the dose of his SGLT2 inhibitor 1st.  He started Invokana 300 mg daily before breakfast, however, cardiologist at [facility] clinic took him off the SGLT 2 inhibitor and  switch him to Januvia until he comes to see me today. -His sugars appear to have improved after his surgery and they are  after close to goal.  However, we discussed about positive cardiovascular benefit of SGLT2 inhibitors and will switch to low-dose Farxiga at this visit.  -Since last visit, he lost ~15 pounds!  He mentions that this happened during his hospitalization. - I suggested to: Patient Instructions  Please  continue: - Metformin 1000 mg 2x a day with meals  Please start: - Farxiga 5 mg daily before breakfast  When you finish Januvia.  Please return in 4 months with your sugar log.   - we checked his HbA1c: 6.0% (stable) - advised to check sugars at different times of the day - 1x a day, rotating check times - advised for yearly eye exams >> he is not UTD but has it scheduled - return to clinic in 4 months   2. PN 2/2 DM  -He has numbness but no pain -He continues on alpha-lipoic acid and B complex-feels that these are helping  3. HL -Reviewed latest lipid panel from 11/2018: We will higher than goal (lower than 70), triglycerides high, HDL low Lab Results  Component Value Date   CHOL 151 11/04/2018   HDL 35.30 (L) 11/04/2018   LDLCALC 65 10/29/2017   LDLDIRECT 84.0 11/04/2018   TRIG 216.0 (H) 11/04/2018   CHOLHDL 4 11/04/2018  -Continues on Lipitor 40 without side effects  [name], MD PhD [facility]

## 2019-02-12 NOTE — Addendum Note (Signed)
Addended by: Darliss Ridgel I on: 02/12/2019 09:52 AM   Modules accepted: Orders

## 2019-02-12 NOTE — Patient Instructions (Signed)
Please  continue: - Metformin 1000 mg 2x a day with meals  Please start: - Farxiga 5 mg daily before breakfast  When you finish Januvia.  Please return in 4 months with your sugar log.

## 2019-02-12 NOTE — Telephone Encounter (Signed)
Pts bp 122/70 heart rate 71

## 2019-02-17 NOTE — Telephone Encounter (Signed)
25 bid

## 2019-02-21 ENCOUNTER — Other Ambulatory Visit: Payer: Self-pay | Admitting: Internal Medicine

## 2019-02-26 ENCOUNTER — Telehealth (HOSPITAL_COMMUNITY): Payer: Self-pay

## 2019-02-26 NOTE — Telephone Encounter (Signed)

## 2019-02-27 ENCOUNTER — Encounter (HOSPITAL_COMMUNITY): Payer: Self-pay | Admitting: Internal Medicine

## 2019-02-27 ENCOUNTER — Other Ambulatory Visit: Payer: Self-pay

## 2019-02-27 ENCOUNTER — Ambulatory Visit (HOSPITAL_COMMUNITY)
Admission: RE | Admit: 2019-02-27 | Discharge: 2019-02-27 | Disposition: A | Discharge status: Home / Self Care | Payer: BC Managed Care – PPO | Source: Ambulatory Visit | Attending: Internal Medicine | Admitting: Internal Medicine

## 2019-02-27 VITALS — BP 100/58 | HR 67 | Wt 201.2 lb

## 2019-02-27 DIAGNOSIS — Z833 Family history of diabetes mellitus: Secondary | ICD-10-CM | POA: Insufficient documentation

## 2019-02-27 DIAGNOSIS — I3139 Other pericardial effusion (noninflammatory): Secondary | ICD-10-CM

## 2019-02-27 DIAGNOSIS — Q231 Congenital insufficiency of aortic valve: Secondary | ICD-10-CM

## 2019-02-27 DIAGNOSIS — Z953 Presence of xenogenic heart valve: Secondary | ICD-10-CM | POA: Diagnosis not present

## 2019-02-27 DIAGNOSIS — H409 Unspecified glaucoma: Secondary | ICD-10-CM | POA: Insufficient documentation

## 2019-02-27 DIAGNOSIS — Z7984 Long term (current) use of oral hypoglycemic drugs: Secondary | ICD-10-CM | POA: Diagnosis not present

## 2019-02-27 DIAGNOSIS — I313 Pericardial effusion (noninflammatory): Secondary | ICD-10-CM

## 2019-02-27 DIAGNOSIS — I10 Essential (primary) hypertension: Secondary | ICD-10-CM | POA: Diagnosis not present

## 2019-02-27 DIAGNOSIS — Z8249 Family history of ischemic heart disease and other diseases of the circulatory system: Secondary | ICD-10-CM | POA: Diagnosis not present

## 2019-02-27 DIAGNOSIS — I712 Thoracic aortic aneurysm, without rupture: Secondary | ICD-10-CM

## 2019-02-27 DIAGNOSIS — Z880 Allergy status to penicillin: Secondary | ICD-10-CM | POA: Diagnosis not present

## 2019-02-27 DIAGNOSIS — I2510 Atherosclerotic heart disease of native coronary artery without angina pectoris: Secondary | ICD-10-CM | POA: Diagnosis not present

## 2019-02-27 DIAGNOSIS — E119 Type 2 diabetes mellitus without complications: Secondary | ICD-10-CM | POA: Diagnosis not present

## 2019-02-27 DIAGNOSIS — I7121 Aneurysm of the ascending aorta, without rupture: Secondary | ICD-10-CM

## 2019-02-27 DIAGNOSIS — Z7982 Long term (current) use of aspirin: Secondary | ICD-10-CM | POA: Diagnosis not present

## 2019-02-27 DIAGNOSIS — Z79899 Other long term (current) drug therapy: Secondary | ICD-10-CM | POA: Diagnosis not present

## 2019-02-27 MED ORDER — METOPROLOL TARTRATE 25 MG PO TABS: 12.5000 mg | ORAL_TABLET | Freq: Two times a day (BID) | ORAL | 2 refills | Status: DC

## 2019-02-27 NOTE — Patient Instructions (Signed)
DECREASE Lopressor to 12.5mg  (1/2 tab) twice a day  Your physician recommends that you schedule a follow-up appointment in: 2 months with Dr Gala Romney  April Garage code [id number]  Please call office at [phone] option [phone] if you have any questions or concerns.   At the [facility], you and your health needs are our priority. As part of our continuing mission to provide you with exceptional heart care, we have created designated Provider Care Teams. These Care Teams include your primary Cardiologist (physician) and Advanced Practice Providers (APPs- Physician Assistants and Nurse Practitioners) who all work together to provide you with the care you need, when you need it.   You may see any of the following providers on your designated Care Team at your next follow up: Marland Kitchen Dr Arvilla Meres . Dr Marca Ancona . Tonye Becket, NP . Robbie Lis, PA . Karle Plumber, PharmD   Please be sure to bring in all your medications bottles to every appointment.

## 2019-02-27 NOTE — Progress Notes (Signed)
Advanced Heart Failure Clinic Note   Referring Physician: PCP: Cassandria Anger, MD   HPI:  Clifford Alvarez is a 64 y/o male with h/o HTN, DM2 who was recently found to have bicuspid aortic valve with dilated aortic root. Underwent Bentall aortic root replacement with bioprosthetic AVR on 01/10/19 at the [facility].  Pre-op cardiac CTA. No significant CAD Calcium score 11  Post-op course relatively uncomplicated. Had moderate pericardia effusion on post-op echo and started on colchicine.   Now doing well. Walking on TM 1.5 miles. 3.37mph. No incline.  3x/day/ 7 days per week. No CP, SOB or edema.    Past Medical History:  Diagnosis Date  . Ascending aortic aneurysm (HCC)    5.4cm ascending aorta by Chest CT 12/2018, s/p thoracic aortic aneurysm repair with Biobentall 92mm Perimount Valve, 74mm graft  . Bicuspid aortic valve    no AS or AI on echo 12/2018  . CAD (coronary artery disease), native coronary artery    minimal CAD of the pLAD and diagonal with 0-24% stenosis  . Coronary artery calcification seen on CAT scan    calcium score 11 12/2018  . Diabetes mellitus without complication ([place])   . Glaucoma   . Heart murmur    History of childhood heart murmur (Aortic)    Current Outpatient Medications  Medication Sig Dispense Refill  . Alpha-Lipoic Acid 300 MG CAPS Take 300 mg by mouth 2 (two) times daily.     Marland Kitchen aspirin EC 81 MG tablet Take 1 tablet (81 mg total) by mouth daily. 90 tablet 3  . atorvastatin (LIPITOR) 40 MG tablet Take 1 tablet (40 mg total) by mouth daily at 6 PM. 90 tablet 3  . B Complex-C (SUPER B COMPLEX/VITAMIN C) TABS 1 tablet by mouth daily    . CVS D3 50 MCG (2000 UT) CAPS TAKE 1 CAPSULE BY MOUTH EVERY DAY 90 capsule 3  . dapagliflozin propanediol (FARXIGA) 5 MG TABS tablet Take 5 mg by mouth daily. 90 tablet 3  . dorzolamide-timolol (COSOPT) 22.3-6.8 MG/ML ophthalmic solution Place 1 drop into both eyes 2 (two) times daily.    . ferrous sulfate 325 (65  FE) MG EC tablet Take 325 mg by mouth 2 (two) times daily.    Marland Kitchen glucose blood (ONETOUCH VERIO) test strip Use to test blood sugar one time daily as instructed. 100 strip 11  . latanoprost (XALATAN) 0.005 % ophthalmic solution Place 1 drop into both eyes at bedtime.     . metFORMIN (GLUCOPHAGE) 500 MG tablet TAKE 2 TABLETS BY MOUTH TWICE A DAY WITH A MEAL 360 tablet 2  . metoprolol tartrate (LOPRESSOR) 25 MG tablet Take 1 tablet (25 mg total) by mouth 2 (two) times daily. 60 tablet 3   Current Facility-Administered Medications  Medication Dose Route Frequency Provider Last Rate Last Admin  . ketoconazole (NIZORAL) 2 % cream   Topical BID Plotnikov, Evie Lacks, MD        Allergies  Allergen Reactions  . Penicillins     As a child      Social History   Socioeconomic History  . Marital status: Married    Spouse name: Jackelyn Poling  . Number of children: 2  . Years of education: Not on file  . Highest education level: Not on file  Occupational History  . 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 Smoking status: Never Smoker  . Smokeless tobacco: Never Used  Substance and Sexual  Activity  . Alcohol use: No  . Drug use: No  . Sexual activity: Not on file  Other Topics Concern  . Not on file  Social History Narrative   Married 38 years   Wife - Barbarann Ehlers 12   Daughter 20   Social Determinants of Corporate investment banker Strain:   . Difficulty of Paying Living Expenses: Not on file  Food Insecurity:   . Worried About Programme researcher, broadcasting/film/video in the Last Year: Not on file  . Ran Out of Food in the Last Year: Not on file  Transportation Needs:   . Lack of Transportation (Medical): Not on file  . Lack of Transportation (Non-Medical): Not on file  Physical Activity:   . Days of Exercise per Week: Not on file  . Minutes of Exercise per Session: Not on file  Stress:   . Feeling of Stress : Not on file  Social Connections:   . Frequency of  Communication with Friends and Family: Not on file  . Frequency of Social Gatherings with Friends and Family: Not on file  . Attends Religious Services: Not on file  . Active Member of Clubs or Organizations: Not on file  . Attends Banker Meetings: Not on file  . Marital Status: Not on file  Intimate Partner Violence:   . Fear of Current or Ex-Partner: Not on file  . Emotionally Abused: Not on file  . Physically Abused: Not on file  . Sexually Abused: Not on file      Family History  Problem Relation Age of Onset  . Diabetes Mother   . Hypertension Mother   . Diabetes Father   . Heart disease Father   . Diabetes Brother   . Diabetes Sister     Vitals:   02/27/19 1203  BP: (!) 100/58  Pulse: 67  SpO2: 97%  Weight: 91.3 kg (201 lb 3.2 oz)     PHYSICAL EXAM: General:  Well appearing. No resp difficulty HEENT: normal Neck: supple. no JVD. Carotids 2+ bilat; no bruits. No lymphadenopathy or thryomegaly appreciated. Cor: Sternal scar well healed PMI nondisplaced. Regular rate & rhythm. Soft SEM at RUSB Lungs: clear Abdomen: soft, nontender, nondistended. No hepatosplenomegaly. No bruits or masses. Good bowel sounds. Extremities: no cyanosis, clubbing, rash, edema Neuro: alert & orientedx3, cranial nerves grossly intact. moves all 4 extremities w/o difficulty. Affect pleasant   ECG: NSR 77 LVH with diffuse TWI. Personally reviewed  ASSESSMENT & PLAN:  1. Bicuspid aortic valve with dilated aortic root.  - s/p Bentall aortic root replacement with bioprosthetic AVR on 01/10/19 at the [facility] - doing very well. NYHA I  - can cut lopressor back to 12.5 bid - discussed CR but would like to avoid for now with COVID risk. Discussed exercise prescription  - discussed SBE prophylaxis  2. Post-op pericardial effusion - resolved on recent echo. Personally reviewed with him  Arvilla Meres, MD 02/27/19

## 2019-03-05 ENCOUNTER — Ambulatory Visit: Payer: BC Managed Care – PPO | Admitting: Cardiology

## 2019-03-06 ENCOUNTER — Encounter (HOSPITAL_COMMUNITY): Payer: Self-pay

## 2019-03-07 ENCOUNTER — Other Ambulatory Visit: Payer: Self-pay | Admitting: Internal Medicine

## 2019-03-07 DIAGNOSIS — D509 Iron deficiency anemia, unspecified: Secondary | ICD-10-CM

## 2019-03-07 DIAGNOSIS — E785 Hyperlipidemia, unspecified: Secondary | ICD-10-CM

## 2019-03-07 DIAGNOSIS — E1142 Type 2 diabetes mellitus with diabetic polyneuropathy: Secondary | ICD-10-CM

## 2019-04-30 ENCOUNTER — Other Ambulatory Visit: Payer: Self-pay

## 2019-04-30 ENCOUNTER — Ambulatory Visit (HOSPITAL_COMMUNITY)
Admission: RE | Admit: 2019-04-30 | Discharge: 2019-04-30 | Disposition: A | Discharge status: Home / Self Care | Payer: BC Managed Care – PPO | Source: Ambulatory Visit | Attending: Internal Medicine | Admitting: Internal Medicine

## 2019-04-30 ENCOUNTER — Encounter (HOSPITAL_COMMUNITY): Payer: Self-pay | Admitting: Internal Medicine

## 2019-04-30 VITALS — BP 132/74 | HR 57 | Wt 208.2 lb

## 2019-04-30 DIAGNOSIS — Z7984 Long term (current) use of oral hypoglycemic drugs: Secondary | ICD-10-CM | POA: Diagnosis not present

## 2019-04-30 DIAGNOSIS — Z7982 Long term (current) use of aspirin: Secondary | ICD-10-CM | POA: Insufficient documentation

## 2019-04-30 DIAGNOSIS — I712 Thoracic aortic aneurysm, without rupture, unspecified: Secondary | ICD-10-CM

## 2019-04-30 DIAGNOSIS — Z8249 Family history of ischemic heart disease and other diseases of the circulatory system: Secondary | ICD-10-CM | POA: Diagnosis not present

## 2019-04-30 DIAGNOSIS — Z880 Allergy status to penicillin: Secondary | ICD-10-CM | POA: Diagnosis not present

## 2019-04-30 DIAGNOSIS — H409 Unspecified glaucoma: Secondary | ICD-10-CM | POA: Insufficient documentation

## 2019-04-30 DIAGNOSIS — E119 Type 2 diabetes mellitus without complications: Secondary | ICD-10-CM | POA: Insufficient documentation

## 2019-04-30 DIAGNOSIS — Z79899 Other long term (current) drug therapy: Secondary | ICD-10-CM | POA: Diagnosis not present

## 2019-04-30 DIAGNOSIS — Z833 Family history of diabetes mellitus: Secondary | ICD-10-CM | POA: Diagnosis not present

## 2019-04-30 DIAGNOSIS — Z953 Presence of xenogenic heart valve: Secondary | ICD-10-CM | POA: Insufficient documentation

## 2019-04-30 DIAGNOSIS — Q231 Congenital insufficiency of aortic valve: Secondary | ICD-10-CM | POA: Insufficient documentation

## 2019-04-30 DIAGNOSIS — I10 Essential (primary) hypertension: Secondary | ICD-10-CM | POA: Insufficient documentation

## 2019-04-30 DIAGNOSIS — I2510 Atherosclerotic heart disease of native coronary artery without angina pectoris: Secondary | ICD-10-CM | POA: Insufficient documentation

## 2019-04-30 NOTE — Addendum Note (Signed)
Encounter addended by: Marisa Hua, RN on: 04/30/2019 9:41 AM  Actions taken: Order list changed, Medication long-term status modified, Clinical Note Signed

## 2019-04-30 NOTE — Patient Instructions (Signed)
STOP IRON  STOP LOPRESSOR  Your physician wants you to follow-up in: 6 months.  You will receive a reminder letter in the mail two months in advance. If you don't receive a letter, please call our office to schedule the follow-up appointment.  Please call office at [phone] option [phone] if you have any questions or concerns.   At the [facility], you and your health needs are our priority. As part of our continuing mission to provide you with exceptional heart care, we have created designated Provider Care Teams. These Care Teams include your primary Cardiologist (physician) and Advanced Practice Providers (APPs- Physician Assistants and Nurse Practitioners) who all work together to provide you with the care you need, when you need it.   You may see any of the following providers on your designated Care Team at your next follow up: Marland Kitchen Dr Arvilla Meres . Dr Marca Ancona . Tonye Becket, NP . Robbie Lis, PA . Karle Plumber, PharmD   Please be sure to bring in all your medications bottles to every appointment.

## 2019-04-30 NOTE — Progress Notes (Signed)
[facility] Note   Referring Physician: PCP: Cassandria Anger, MD   HPI:  Clifford Alvarez is a 64 y/o male with h/o HTN, DM2 who was recently found to have bicuspid aortic valve with dilated aortic root. Underwent Bentall aortic root replacement with bioprosthetic AVR on 01/10/19 at the [facility].  Pre-op cardiac CTA. No significant CAD Calcium score 11  Post-op course relatively uncomplicated. Had moderate pericardia effusion on post-op echo and started on colchicine.   Here for routine follow-up. Doing great. Very active walking 3-5 miles per day. No CP or SOB. BP running a bit higher than previously. BP typically 110-125 with occasional readings in 140-150s.    Past Medical History:  Diagnosis Date  . Ascending aortic aneurysm (HCC)    5.4cm ascending aorta by Chest CT 12/2018, s/p thoracic aortic aneurysm repair with Biobentall 73mm Perimount Valve, 53mm graft  . Bicuspid aortic valve    no AS or AI on echo 12/2018  . CAD (coronary artery disease), native coronary artery    minimal CAD of the pLAD and diagonal with 0-24% stenosis  . Coronary artery calcification seen on CAT scan    calcium score 11 12/2018  . Diabetes mellitus without complication ([place])   . Glaucoma   . Heart murmur    History of childhood heart murmur (Aortic)    Current Outpatient Medications  Medication Sig Dispense Refill  . Alpha-Lipoic Acid 300 MG CAPS Take 300 mg by mouth 2 (two) times daily.     Marland Kitchen aspirin EC 81 MG tablet Take 1 tablet (81 mg total) by mouth daily. 90 tablet 3  . atorvastatin (LIPITOR) 40 MG tablet Take 1 tablet (40 mg total) by mouth daily at 6 PM. 90 tablet 3  . B Complex-C (SUPER B COMPLEX/VITAMIN C) TABS 1 tablet by mouth daily    . CVS D3 50 MCG (2000 UT) CAPS TAKE 1 CAPSULE BY MOUTH EVERY DAY 90 capsule 3  . dapagliflozin propanediol (FARXIGA) 5 MG TABS tablet Take 5 mg by mouth daily. 90 tablet 3  . dorzolamide-timolol (COSOPT) 22.3-6.8 MG/ML ophthalmic  solution Place 1 drop into both eyes 2 (two) times daily.    . ferrous sulfate 325 (65 FE) MG EC tablet Take 325 mg by mouth 2 (two) times daily.    Marland Kitchen glucose blood (ONETOUCH VERIO) test strip Use to test blood sugar one time daily as instructed. 100 strip 11  . latanoprost (XALATAN) 0.005 % ophthalmic solution Place 1 drop into both eyes at bedtime.     . metFORMIN (GLUCOPHAGE) 500 MG tablet TAKE 2 TABLETS BY MOUTH TWICE A DAY WITH A MEAL 360 tablet 2  . metoprolol tartrate (LOPRESSOR) 25 MG tablet Take 0.5 tablets (12.5 mg total) by mouth 2 (two) times daily. 90 tablet 2   Current Facility-Administered Medications  Medication Dose Route Frequency Provider Last Rate Last Admin  . ketoconazole (NIZORAL) 2 % cream   Topical BID Plotnikov, Evie Lacks, MD        Allergies  Allergen Reactions  . Penicillins     As a child      Social History   Socioeconomic History  . Marital status: Married    Spouse name: Jackelyn Poling  . Number of children: 2  . Years of education: Not on file  . Highest education level: Not on file  Occupational History  . 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 Smoking status: Never Smoker  .  Smokeless tobacco: Never Used  Substance and Sexual Activity  . Alcohol use: No  . Drug use: No  . Sexual activity: Not on file  Other Topics Concern  . Not on file  Social History Narrative   Married 38 years   Wife - Barbarann Ehlers 70   Daughter 36   Social Determinants of Corporate investment banker Strain:   . Difficulty of Paying Living Expenses:   Food Insecurity:   . Worried About Programme researcher, broadcasting/film/video in the Last Year:   . Barista in the Last Year:   Transportation Needs:   . Freight forwarder (Medical):   Marland Kitchen Lack of Transportation (Non-Medical):   Physical Activity:   . Days of Exercise per Week:   . Minutes of Exercise per Session:   Stress:   . Feeling of Stress :   Social Connections:   . Frequency  of Communication with Friends and Family:   . Frequency of Social Gatherings with Friends and Family:   . Attends Religious Services:   . Active Member of Clubs or Organizations:   . Attends Banker Meetings:   Marland Kitchen Marital Status:   Intimate Partner Violence:   . Fear of Current or Ex-Partner:   . Emotionally Abused:   Marland Kitchen Physically Abused:   . Sexually Abused:       Family History  Problem Relation Age of Onset  . Diabetes Mother   . Hypertension Mother   . Diabetes Father   . Heart disease Father   . Diabetes Brother   . Diabetes Sister     Vitals:   04/30/19 0901  BP: 132/74  Pulse: (!) 57  SpO2: 98%  Weight: 94.4 kg (208 lb 3.2 oz)     PHYSICAL EXAM: General:  Well appearing. No resp difficulty HEENT: normal Neck: supple. no JVD. Carotids 2+ bilat; no bruits. No lymphadenopathy or thryomegaly appreciated. Cor: PMI nondisplaced. Regular brady. No rubs, gallops or murmurs. Lungs: clear Abdomen: soft, nontender, nondistended. No hepatosplenomegaly. No bruits or masses. Good bowel sounds. Extremities: no cyanosis, clubbing, rash, edema Neuro: alert & orientedx3, cranial nerves grossly intact. moves all 4 extremities w/o difficulty. Affect pleasant   ECG: S brady 55 LVH . Personally reviewed  ASSESSMENT & PLAN:  1. Bicuspid aortic valve with dilated aortic root.  - s/p Bentall aortic root replacement with bioprosthetic AVR on 01/10/19 at the [facility] - doing very well. NYHA I  - continues to do very well  - HR low. Can stop lopressor - discussed SBE prophylaxis - Discussed weight lifting program   2. Post-op pericardial effusion - resolved on recent echo. Personally reviewed with him  3. HTN - BP borderline high at times. Call me if persistently in the 140s  Arvilla Meres, MD 04/30/19

## 2019-05-02 ENCOUNTER — Other Ambulatory Visit (HOSPITAL_COMMUNITY): Payer: Self-pay | Admitting: Internal Medicine

## 2019-05-12 ENCOUNTER — Ambulatory Visit: Payer: BC Managed Care – PPO | Admitting: Internal Medicine

## 2019-06-30 ENCOUNTER — Telehealth: Payer: Self-pay | Admitting: Internal Medicine

## 2019-06-30 ENCOUNTER — Encounter: Payer: Self-pay | Admitting: Internal Medicine

## 2019-06-30 ENCOUNTER — Other Ambulatory Visit: Payer: Self-pay

## 2019-06-30 ENCOUNTER — Ambulatory Visit (INDEPENDENT_AMBULATORY_CARE_PROVIDER_SITE_OTHER): Payer: Medicare Other | Admitting: Internal Medicine

## 2019-06-30 VITALS — BP 122/78 | HR 65 | Ht 77.0 in | Wt 205.0 lb

## 2019-06-30 DIAGNOSIS — I25119 Atherosclerotic heart disease of native coronary artery with unspecified angina pectoris: Secondary | ICD-10-CM | POA: Diagnosis not present

## 2019-06-30 DIAGNOSIS — E1142 Type 2 diabetes mellitus with diabetic polyneuropathy: Secondary | ICD-10-CM | POA: Diagnosis not present

## 2019-06-30 DIAGNOSIS — E785 Hyperlipidemia, unspecified: Secondary | ICD-10-CM

## 2019-06-30 DIAGNOSIS — G63 Polyneuropathy in diseases classified elsewhere: Secondary | ICD-10-CM

## 2019-06-30 DIAGNOSIS — Z0000 Encounter for general adult medical examination without abnormal findings: Secondary | ICD-10-CM

## 2019-06-30 LAB — POCT GLYCOSYLATED HEMOGLOBIN (HGB A1C): Hemoglobin A1C: 6.4 % — AB (ref 4.0–5.6)

## 2019-06-30 MED ORDER — METFORMIN HCL 1000 MG PO TABS: ORAL_TABLET | ORAL | 3 refills | Status: DC

## 2019-06-30 NOTE — Telephone Encounter (Signed)
New message:   Pt would like lab orders put in for the week of the 5th since he will be out of town the next weeks before his appt. Please advise.

## 2019-06-30 NOTE — Patient Instructions (Addendum)
Please  continue: - Metformin 1000 mg 2x a day with meals - Farxiga 5 mg daily before breakfast   Check sugars later in the day, also, rotating check times.  Please return in 4-5 months with your sugar log.

## 2019-06-30 NOTE — Addendum Note (Signed)
Addended by: Darliss Ridgel I on: 06/30/2019 09:15 AM   Modules accepted: Orders

## 2019-06-30 NOTE — Progress Notes (Signed)
Patient ID: Clifford Alvarez, male   DOB: [birth year], 64 y.o.   MRN: [id number]  This visit occurred during the SARS-CoV-2 public health emergency.  Safety protocols were in place, including screening questions prior to the visit, additional usage of staff PPE, and extensive cleaning of exam room while observing appropriate contact time as indicated for disinfecting solutions.   HPI: Clifford Alvarez is a 64 y.o.-year-old male, returning for f/u for DM2 dx 01/28/2013, previously insulin-dependent, now off insulin, controlled, with complications (Peripheral neuropathy). Last visit was 4.5 months ago. He changed his insurance before last visit.  Before our last visit, he was found to have a thoracic aortic aneurysm and had surgery for this at [facility] clinic on 01/10/2019.  He recovered well after this.  He does not have any chest pain or shortness of breath.  He is able to walk 3 miles every day.  Reviewed HbA1c levels: Lab Results  Component Value Date   HGBA1C 6.0 (A) 02/12/2019   HGBA1C 6.0 (A) 11/11/2018   HGBA1C 6.1 (A) 05/20/2018   HGBA1C 5.8 (A) 11/19/2017   HGBA1C 6.0 05/14/2017   HGBA1C 6 01/08/2017   HGBA1C 6.3 10/26/2016   HGBA1C 5.7 08/07/2016   HGBA1C 6.0 04/17/2016   HGBA1C 6.0 12/13/2015   HGBA1C 5.9 08/12/2015   HGBA1C 5.9 04/13/2015   HGBA1C 5.8 12/15/2014   HGBA1C 5.9 08/17/2014   HGBA1C 6.1 05/11/2014   HGBA1C 6.3 02/11/2014   HGBA1C 6.1 11/10/2013   HGBA1C 6.5 08/08/2013   HGBA1C 6.9 (H) 05/06/2013   HGBA1C 12.1 (H) 01/28/2013   He is on: - Metformin 1000 mg 2x a day with meals - Jardiance 25 mg in am >> Farxiga 10 mg in a.m. >> Invokana 100,  then 300 mg daily - changed 11/2018 per insurance preference >> but stopped 2-3 weeks ago  - he was switched to Januvia 100 by the cardiologist at [facility] >> changed back to [place] 5 mg daily in 02/2019 He was previously on Basaglar 12 units at bedtime >> stopped 08/2016 >> restarted: 6-8 units 3x a day >>  now off completely.  Pt checks his sugars once a day: - am:  120, 130-160 >> 119-144, 155 >> 115-145 - 2h after b'fast: 146 >> 197 >> n/c  - before lunch:  102, 128 >> n/c - 2h after lunch: 1n/c >> 126 >> n/c - before dinner: n/c >> n/c >> 124-142 >> n/c - 2h after dinner:  110-140s >> 130-140 >> n/c - bedtime:  120-174 >> n/c - nighttime:  123-145, 170 >> n/c Lowest sugar was 115 >> 120 >> 119 >> 115.  He  has hypoglycemia awareness in the 70s. Highest sugar was 160 >> 170 >> 168 >> 160s.  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  Pt's meals are: - Breakfast: bagel, oatmeal, granola, eggs - Lunch: meat + 2-3 vegetables; soup; salad; sandwich - Dinner: meat + 2-3 vegetables; pasta; [place]; seafood - Snacks: 1 a day >> nuts, popcorn, dark chocolate  -No CKD, last BUN/creatinine:  Lab Results  Component Value Date   BUN 12 01/24/2019   CREATININE 1.00 01/24/2019   Lab Results  Component Value Date   GFRNONAA >60 01/24/2019   GFRNONAA >89 04/13/2015   No MAU: Lab Results  Component Value Date   MICRALBCREAT 0.6 11/04/2018   MICRALBCREAT 1.6 10/29/2017   MICRALBCREAT 0.8 10/26/2016   MICRALBCREAT 0.6 04/13/2015   MICRALBCREAT 0.7 05/11/2014   MICRALBCREAT 0.3 03/12/2013  On losartan.  -+  HL; reviewed lipids: Lab Results  Component Value Date   CHOL 151 11/04/2018   HDL 35.30 (L) 11/04/2018   LDLCALC 65 10/29/2017   LDLDIRECT 84.0 11/04/2018   TRIG 216.0 (H) 11/04/2018   CHOLHDL 4 11/04/2018  On Lipitor 40. - [facility] + Glaucoma, no DR. Dr. Lottie DawsonBond.   - + Numbness but no tingling in his feet  ROS: Constitutional: no weight gain/no weight loss, no fatigue, no subjective hyperthermia, no subjective hypothermia Eyes: no blurry vision, no xerophthalmia ENT: no sore throat, no nodules palpated in neck, no dysphagia, no odynophagia, no hoarseness Cardiovascular: no CP/no SOB/no palpitations/no leg swelling Respiratory: no cough/no  SOB/no wheezing Gastrointestinal: no N/no V/no D/no C/no acid reflux Musculoskeletal: no muscle aches/no joint aches Skin: no rashes, no hair loss Neurological: no tremors/+ numbness/no tingling/no dizziness  I reviewed pt's medications, allergies, PMH, social hx, family hx, and changes were documented in the history of present illness. Otherwise, unchanged from my initial visit note.  Past Medical History:  Diagnosis Date  . Ascending aortic aneurysm (HCC)    5.4cm ascending aorta by Chest CT 12/2018, s/p thoracic aortic aneurysm repair with Biobentall 25mm Perimount Valve, 30mm graft  . Bicuspid aortic valve    no AS or AI on echo 12/2018  . CAD (coronary artery disease), native coronary artery    minimal CAD of the pLAD and diagonal with 0-24% stenosis  . Coronary artery calcification seen on CAT scan    calcium score 11 12/2018  . Diabetes mellitus without complication (HCC)   . Glaucoma   . Heart murmur    History of childhood heart murmur (Aortic)   Past Surgical History:  Procedure Laterality Date  . BENTALL [facility]ED[facility]   [facility]  . COLONOSCOPY  2006   Dr. Matthias HughsBuccini   Social History   Socioeconomic History  . Marital status: Married    Spouse name: Clifford Alvarez  . Number of children: 2  . Years of education: Not on file  . Highest education level: Not on file  Occupational History  . 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 Smoking status: Never Smoker  . Smokeless tobacco: Never Used  Substance and Sexual Activity  . Alcohol use: No  . Drug use: No  . Sexual activity: Not on file  Other Topics Concern  . Not on file  Social History Narrative   Married 38 years   Wife - Clifford Alvarez   Son [id number]   Daughter [id number]   Social Determinants of Corporate investment bankerHealth   Financial Resource Strain:   . Difficulty of Paying Living Expenses:   Food Insecurity:   . Worried About Programme researcher, broadcasting/film/videounning Out of Food in the Last Year:   . Baristaan Out of Food in  the Last Year:   Transportation Needs:   . Freight forwarderLack of Transportation (Medical):   Marland Kitchen. Lack of Transportation (Non-Medical):   Physical Activity:   . Days of Exercise per Week:   . Minutes of Exercise per Session:   Stress:   . Feeling of Stress :   Social Connections:   . Frequency of Communication with Friends and Family:   . Frequency of Social Gatherings with Friends and Family:   . Attends Religious Services:   . Active Member of Clubs or Organizations:   . Attends BankerClub or Organization Meetings:   Marland Kitchen. Marital Status:   Intimate Partner Violence:   . Fear  of Current or Ex-Partner:   . Emotionally Abused:   Marland Kitchen Physically Abused:   . Sexually Abused:    Current Outpatient Medications on File Prior to Visit  Medication Sig Dispense Refill  . Alpha-Lipoic Acid 300 MG CAPS Take 300 mg by mouth 2 (two) times daily.     Marland Kitchen aspirin EC 81 MG tablet Take 1 tablet (81 mg total) by mouth daily. 90 tablet 3  . atorvastatin (LIPITOR) 40 MG tablet Take 1 tablet (40 mg total) by mouth daily at 6 PM. 90 tablet 3  . B Complex-C (SUPER B COMPLEX/VITAMIN C) TABS 1 tablet by mouth daily    . CVS D3 50 MCG (2000 UT) CAPS TAKE 1 CAPSULE BY MOUTH EVERY DAY 90 capsule 3  . dapagliflozin propanediol (FARXIGA) 5 MG TABS tablet Take 5 mg by mouth daily. 90 tablet 3  . dorzolamide-timolol (COSOPT) 22.3-6.8 MG/ML ophthalmic solution Place 1 drop into both eyes 2 (two) times daily.    Marland Kitchen glucose blood (ONETOUCH VERIO) test strip Use to test blood sugar one time daily as instructed. 100 strip 11  . latanoprost (XALATAN) 0.005 % ophthalmic solution Place 1 drop into both eyes at bedtime.     . metFORMIN (GLUCOPHAGE) 500 MG tablet TAKE 2 TABLETS BY MOUTH TWICE A DAY WITH A MEAL 360 tablet 2   Current Facility-Administered Medications on File Prior to Visit  Medication Dose Route Frequency Provider Last Rate Last Admin  . ketoconazole (NIZORAL) 2 % cream   Topical BID Plotnikov, Georgina Quint, MD       Allergies  Allergen  Reactions  . Penicillins     As a child   Family History  Problem Relation Age of Onset  . Diabetes Mother   . Hypertension Mother   . Diabetes Father   . Heart disease Father   . Diabetes Brother   . Diabetes Sister     PE: BP 122/78   Pulse 65   Ht 6\' 5"  (1.956 m)   Wt 205 lb (93 kg)   SpO2 95%   BMI 24.31 kg/m  Body mass index is 24.31 kg/m.  Wt Readings from Last 3 Encounters:  06/30/19 205 lb (93 kg)  04/30/19 208 lb 3.2 oz (94.4 kg)  02/27/19 201 lb 3.2 oz (91.3 kg)   Constitutional: overweight, in NAD Eyes: PERRLA, EOMI, no exophthalmos ENT: moist mucous membranes, no thyromegaly, no cervical lymphadenopathy Cardiovascular: RRR, No MRG Respiratory: CTA B Gastrointestinal: abdomen soft, NT, ND, BS+ Musculoskeletal: no deformities, strength intact in all 4 Skin: moist, warm, no rashes Neurological: no tremor with outstretched hands, DTR normal in all 4  ASSESSMENT: 1. DM2, insulin-independent, controlled, with complications  - PN -stable  2. PN 2/2 diabetes  3. HL  PLAN:  1. Patient with controlled diabetes on oral medication regimen with Metformin and SGLT2 inhibitor.  After his aortic aneurysm surgery, his Invokana was changed to a DPP 4 inhibitor by his cardiologist at [facility] clinic to bridge him until he was able to see me after the surgery.  At our last visit, we changed him to [facility] 5 mg daily.  Sugars were close to goal after his surgery, improved.  An HbA1c was excellent, at 6.0%, stable.  He also lost 15 pounds after the surgery!  He gained 4 pounds since last visit. -His sugars are at or slightly higher than goal in the morning, depending on whether he ate starches with dinner or not, but also occasionally he finds that he  has sugars in the low 140s without explanation.  Discussed about other factors that can influence his morning sugars.  I encouraged him to continue to walk, states this is greatly helping his blood sugars, also. - we checked his  HbA1c: 6.4% (higher) -He is only checking sugars in the morning but we discussed about the importance of rotating sugars throughout the day, especially since we need to troubleshoot why his HbA1c did increase since last visit -He tolerates well the low dose [place].  We will continue the current regimen for now - I suggested to: Patient Instructions  Please  continue: - Metformin 1000 mg 2x a day with meals - Farxiga 5 mg daily before breakfast   Please return in 4-5 months with your sugar log.   - advised to check sugars at different times of the day - 1x a day, rotating check times - advised for yearly eye exams >> he is UTD - return to clinic in 6 months  2. PN 2/2 DM  -+ Numbness but no pain -He is on alpha-lipoic acid and B complex.  He feels that these are helping.  3. HL -Reviewed latest lipid panel from 11/2018: LDL was higher, above goal, triglycerides also high, HDL low Lab Results  Component Value Date   CHOL 151 11/04/2018   HDL 35.30 (L) 11/04/2018   LDLCALC 65 10/29/2017   LDLDIRECT 84.0 11/04/2018   TRIG 216.0 (H) 11/04/2018   CHOLHDL 4 11/04/2018  -Continues Lipitor 40 without side effects.  [name], MD PhD [facility] Endocrinology

## 2019-07-02 NOTE — Telephone Encounter (Signed)
Ok Done Thx 

## 2019-07-04 NOTE — Telephone Encounter (Signed)
Left pt a detailed message informing him that labs have been ordered for him.

## 2019-07-09 ENCOUNTER — Other Ambulatory Visit (INDEPENDENT_AMBULATORY_CARE_PROVIDER_SITE_OTHER): Payer: Medicare Other

## 2019-07-09 ENCOUNTER — Other Ambulatory Visit: Payer: Self-pay

## 2019-07-09 DIAGNOSIS — Z0000 Encounter for general adult medical examination without abnormal findings: Secondary | ICD-10-CM

## 2019-07-09 DIAGNOSIS — G63 Polyneuropathy in diseases classified elsewhere: Secondary | ICD-10-CM | POA: Diagnosis not present

## 2019-07-09 DIAGNOSIS — E1142 Type 2 diabetes mellitus with diabetic polyneuropathy: Secondary | ICD-10-CM | POA: Diagnosis not present

## 2019-07-09 LAB — URINALYSIS
Bilirubin Urine: NEGATIVE
Hgb urine dipstick: NEGATIVE
Ketones, ur: NEGATIVE
Leukocytes,Ua: NEGATIVE
Nitrite: NEGATIVE
Specific Gravity, Urine: 1.02 (ref 1.000–1.030)
Total Protein, Urine: NEGATIVE
Urine Glucose: >=1000 — AB
Urobilinogen, UA: 0.2 (ref 0.0–1.0)
pH: 5.5 (ref 5.0–8.0)

## 2019-07-09 LAB — TSH: TSH: 3.25 u[IU]/mL (ref 0.35–4.50)

## 2019-07-09 LAB — PSA: PSA: 0.72 ng/mL (ref 0.10–4.00)

## 2019-07-09 LAB — CBC WITH DIFFERENTIAL/PLATELET
Basophils Absolute: 0 10*3/uL (ref 0.0–0.1)
Basophils Relative: 0.4 % (ref 0.0–3.0)
Eosinophils Absolute: 0.2 10*3/uL (ref 0.0–0.7)
Eosinophils Relative: 3.7 % (ref 0.0–5.0)
HCT: 44 % (ref 39.0–52.0)
Hemoglobin: 14.6 g/dL (ref 13.0–17.0)
Lymphocytes Relative: 32.6 % (ref 12.0–46.0)
Lymphs Abs: 2 10*3/uL (ref 0.7–4.0)
MCHC: 33.2 g/dL (ref 30.0–36.0)
MCV: 88.6 fl (ref 78.0–100.0)
Monocytes Absolute: 0.7 10*3/uL (ref 0.1–1.0)
Monocytes Relative: 10.5 % (ref 3.0–12.0)
Neutro Abs: 3.3 10*3/uL (ref 1.4–7.7)
Neutrophils Relative %: 52.8 % (ref 43.0–77.0)
Platelets: 143 10*3/uL — ABNORMAL LOW (ref 150.0–400.0)
RBC: 4.97 Mil/uL (ref 4.22–5.81)
RDW: 15.3 % (ref 11.5–15.5)
WBC: 6.2 10*3/uL (ref 4.0–10.5)

## 2019-07-09 LAB — LIPID PANEL
Cholesterol: 113 mg/dL (ref 0–200)
HDL: 37.2 mg/dL — ABNORMAL LOW (ref >39.00)
LDL Cholesterol: 56 mg/dL (ref 0–99)
NonHDL: 76.26
Total CHOL/HDL Ratio: 3
Triglycerides: 102 mg/dL (ref 0.0–149.0)
VLDL: 20.4 mg/dL (ref 0.0–40.0)

## 2019-07-09 LAB — BASIC METABOLIC PANEL
BUN: 22 mg/dL (ref 6–23)
CO2: 30 mEq/L (ref 19–32)
Calcium: 9.5 mg/dL (ref 8.4–10.5)
Chloride: 103 mEq/L (ref 96–112)
Creatinine, Ser: 1.06 mg/dL (ref 0.40–1.50)
GFR: 70.06 mL/min (ref >60.00)
Glucose, Bld: 132 mg/dL — ABNORMAL HIGH (ref 70–99)
Potassium: 5.6 mEq/L — ABNORMAL HIGH (ref 3.5–5.1)
Sodium: 138 mEq/L (ref 135–145)

## 2019-07-09 LAB — HEPATIC FUNCTION PANEL
ALT: 14 U/L (ref 0–53)
AST: 17 U/L (ref 0–37)
Albumin: 4.5 g/dL (ref 3.5–5.2)
Alkaline Phosphatase: 82 U/L (ref 39–117)
Bilirubin, Direct: 0.2 mg/dL (ref 0.0–0.3)
Total Bilirubin: 1 mg/dL (ref 0.2–1.2)
Total Protein: 6.8 g/dL (ref 6.0–8.3)

## 2019-07-09 LAB — HEMOGLOBIN A1C: Hgb A1c MFr Bld: 6.4 % (ref 4.6–6.5)

## 2019-07-09 LAB — MICROALBUMIN / CREATININE URINE RATIO
Creatinine,U: 130.7 mg/dL
Microalb Creat Ratio: 0.5 mg/g (ref 0.0–30.0)
Microalb, Ur: <0.7 mg/dL (ref 0.0–1.9)

## 2019-07-11 ENCOUNTER — Other Ambulatory Visit: Payer: Self-pay | Admitting: Internal Medicine

## 2019-07-11 DIAGNOSIS — E1142 Type 2 diabetes mellitus with diabetic polyneuropathy: Secondary | ICD-10-CM

## 2019-07-21 ENCOUNTER — Other Ambulatory Visit: Payer: Self-pay

## 2019-07-21 ENCOUNTER — Other Ambulatory Visit: Payer: Self-pay | Admitting: Internal Medicine

## 2019-07-21 ENCOUNTER — Ambulatory Visit (INDEPENDENT_AMBULATORY_CARE_PROVIDER_SITE_OTHER): Payer: Medicare Other | Admitting: Internal Medicine

## 2019-07-21 ENCOUNTER — Encounter: Payer: Self-pay | Admitting: Internal Medicine

## 2019-07-21 DIAGNOSIS — E7800 Pure hypercholesterolemia, unspecified: Secondary | ICD-10-CM

## 2019-07-21 DIAGNOSIS — E559 Vitamin D deficiency, unspecified: Secondary | ICD-10-CM

## 2019-07-21 DIAGNOSIS — I2510 Atherosclerotic heart disease of native coronary artery without angina pectoris: Secondary | ICD-10-CM | POA: Diagnosis not present

## 2019-07-21 DIAGNOSIS — E1142 Type 2 diabetes mellitus with diabetic polyneuropathy: Secondary | ICD-10-CM | POA: Diagnosis not present

## 2019-07-21 MED ORDER — METFORMIN HCL 1000 MG PO TABS: ORAL_TABLET | ORAL | 3 refills | Status: DC

## 2019-07-21 NOTE — Assessment & Plan Note (Signed)
Vit D 

## 2019-07-21 NOTE — Addendum Note (Signed)
Addended by: Merrilyn Puma on: 07/21/2019 08:52 AM   Modules accepted: Orders

## 2019-07-21 NOTE — Assessment & Plan Note (Signed)
Lipitor 

## 2019-07-21 NOTE — Progress Notes (Signed)
Subjective:  Patient ID: Clifford Alvarez, male    DOB: 01/15/1955  Age: 64 y.o. MRN: [id number]  CC: No chief complaint on file.   HPI Clifford Alvarez presents for HTN, AA, DM f/u  Outpatient Medications Prior to Visit  Medication Sig Dispense Refill  . Alpha-Lipoic Acid 300 MG CAPS Take 300 mg by mouth 2 (two) times daily.     Marland Kitchen aspirin EC 81 MG tablet Take 1 tablet (81 mg total) by mouth daily. 90 tablet 3  . atorvastatin (LIPITOR) 40 MG tablet Take 1 tablet (40 mg total) by mouth daily at 6 PM. 90 tablet 3  . B Complex-C (SUPER B COMPLEX/VITAMIN C) TABS 1 tablet by mouth daily    . CVS D3 50 MCG (2000 UT) CAPS TAKE 1 CAPSULE BY MOUTH EVERY DAY 90 capsule 3  . dapagliflozin propanediol (FARXIGA) 5 MG TABS tablet Take 5 mg by mouth daily. 90 tablet 3  . dorzolamide-timolol (COSOPT) 22.3-6.8 MG/ML ophthalmic solution Place 1 drop into both eyes 2 (two) times daily.    Marland Kitchen glucose blood (ONETOUCH VERIO) test strip Use to test blood sugar one time daily as instructed. 100 strip 11  . Lancets (ONETOUCH DELICA PLUS LANCET30G) MISC USE TO TEST BLOOD SUGAR 3 TIMES DAILY AS INSTRUCTED. 100 each 4  . latanoprost (XALATAN) 0.005 % ophthalmic solution Place 1 drop into both eyes at bedtime.     . metFORMIN (GLUCOPHAGE) 1000 MG tablet TAKE 1 TABLET BY MOUTH TWICE A DAY WITH A MEAL 180 tablet 3   Facility-Administered Medications Prior to Visit  Medication Dose Route Frequency Provider Last Rate Last Admin  . ketoconazole (NIZORAL) 2 % cream   Topical BID Hines Kloss, Georgina Quint, MD        ROS: Review of Systems  Constitutional: Negative for appetite change, fatigue and unexpected weight change.  HENT: Negative for congestion, nosebleeds, sneezing, sore throat and trouble swallowing.   Eyes: Negative for itching and visual disturbance.  Respiratory: Negative for cough.   Cardiovascular: Negative for chest pain, palpitations and leg swelling.  Gastrointestinal: Negative for abdominal  distention, blood in stool, diarrhea and nausea.  Genitourinary: Negative for frequency and hematuria.  Musculoskeletal: Negative for back pain, gait problem, joint swelling and neck pain.  Skin: Negative for rash.  Neurological: Negative for dizziness, tremors, speech difficulty and weakness.  Psychiatric/Behavioral: Negative for agitation, dysphoric mood and sleep disturbance. The patient is not nervous/anxious.     Objective:  BP (!) 146/74 (BP Location: Left Arm, Patient Position: Sitting, Cuff Size: Normal)   Pulse 60   Temp 98.5 F (36.9 C) (Oral)   Ht 6\' 5"  (1.956 m)   Wt 206 lb (93.4 kg)   SpO2 97%   BMI 24.43 kg/m   BP Readings from Last 3 Encounters:  07/21/19 (!) 146/74  06/30/19 122/78  04/30/19 132/74    Wt Readings from Last 3 Encounters:  07/21/19 206 lb (93.4 kg)  06/30/19 205 lb (93 kg)  04/30/19 208 lb 3.2 oz (94.4 kg)    Physical Exam Constitutional:      General: He is not in acute distress.    Appearance: He is well-developed.     Comments: NAD  Eyes:     Conjunctiva/sclera: Conjunctivae normal.     Pupils: Pupils are equal, round, and reactive to light.  Neck:     Thyroid: No thyromegaly.     Vascular: No JVD.  Cardiovascular:     Rate and Rhythm: Normal rate  and regular rhythm.     Heart sounds: Normal heart sounds. No murmur heard.  No friction rub. No gallop.   Pulmonary:     Effort: Pulmonary effort is normal. No respiratory distress.     Breath sounds: Normal breath sounds. No wheezing or rales.  Chest:     Chest wall: No tenderness.  Abdominal:     General: Bowel sounds are normal. There is no distension.     Palpations: Abdomen is soft. There is no mass.     Tenderness: There is no abdominal tenderness. There is no guarding or rebound.  Musculoskeletal:        General: No tenderness. Normal range of motion.     Cervical back: Normal range of motion.  Lymphadenopathy:     Cervical: No cervical adenopathy.  Skin:    General: Skin  is warm and dry.     Findings: No rash.  Neurological:     Mental Status: He is alert and oriented to person, place, and time.     Cranial Nerves: No cranial nerve deficit.     Motor: No abnormal muscle tone.     Coordination: Coordination normal.     Gait: Gait normal.     Deep Tendon Reflexes: Reflexes are normal and symmetric.  Psychiatric:        Behavior: Behavior normal.        Thought Content: Thought content normal.        Judgment: Judgment normal.     Lab Results  Component Value Date   WBC 6.2 07/09/2019   HGB 14.6 07/09/2019   HCT 44.0 07/09/2019   PLT 143.0 (L) 07/09/2019   GLUCOSE 132 (H) 07/09/2019   CHOL 113 07/09/2019   TRIG 102.0 07/09/2019   HDL 37.20 (L) 07/09/2019   LDLDIRECT 84.0 11/04/2018   LDLCALC 56 07/09/2019   ALT 14 07/09/2019   AST 17 07/09/2019   NA 138 07/09/2019   K 5.6 No hemolysis seen (H) 07/09/2019   CL 103 07/09/2019   CREATININE 1.06 07/09/2019   BUN 22 07/09/2019   CO2 30 07/09/2019   TSH 3.25 07/09/2019   PSA 0.72 07/09/2019   HGBA1C 6.4 07/09/2019   MICROALBUR <0.7 07/09/2019    No results found.  Assessment & Plan:   There are no diagnoses linked to this encounter.   No orders of the defined types were placed in this encounter.    Follow-up: No follow-ups on file.  Sonda Primes, MD

## 2019-07-21 NOTE — Assessment & Plan Note (Signed)
Farxiga, Metformin Losartan - not taking, Lipitor

## 2019-07-22 LAB — BASIC METABOLIC PANEL
BUN: 17 mg/dL (ref 7–25)
CO2: 28 mmol/L (ref 20–32)
Calcium: 9.4 mg/dL (ref 8.6–10.3)
Chloride: 109 mmol/L (ref 98–110)
Creat: 1.05 mg/dL (ref 0.70–1.25)
Glucose, Bld: 125 mg/dL — ABNORMAL HIGH (ref 65–99)
Potassium: 4.7 mmol/L (ref 3.5–5.3)
Sodium: 142 mmol/L (ref 135–146)

## 2019-10-21 ENCOUNTER — Other Ambulatory Visit: Payer: Self-pay | Admitting: Internal Medicine

## 2019-11-03 NOTE — Progress Notes (Signed)
[facility] Note   Referring Physician: PCP: Tresa Garter, MD   HPI:  Clifford Alvarez is a 64 y/o male with h/o HTN, DM2 with bicuspid aortic valve with dilated aortic root. Underwent Bentall aortic root replacement with bioprosthetic AVR on 01/10/19 at the [facility].  Pre-op cardiac CTA. No significant CAD Calcium score 11  Post-op course relatively uncomplicated. Had moderate pericardial effusion on post-op echo and started on colchicine. Resolved on f/u echo.   Here for routine follow-up. Doing great. Very active walking 3-5 miles per day on TM at 4 miles/hour. No chest pressure or SOB. A couple times a month gets a brief twinge when at rest and makes him sit up for a second and goes away. Can happen on/off. Younger brother recently passed away in Sep 18, 2022 from presumed MI.  BP at home has been higher for last 3 weeks. Previously SBP 115-130. Now 130-150   Past Medical History:  Diagnosis Date   Ascending aortic aneurysm (HCC)    5.4cm ascending aorta by Chest CT 12/2018, s/p thoracic aortic aneurysm repair with Biobentall 4mm Perimount Valve, 55mm graft   Bicuspid aortic valve    no AS or AI on echo 12/2018   CAD (coronary artery disease), native coronary artery    minimal CAD of the pLAD and diagonal with 0-24% stenosis   Coronary artery calcification seen on CAT scan    calcium score 11 12/2018   Diabetes mellitus without complication (HCC)    Glaucoma    Heart murmur    History of childhood heart murmur (Aortic)    Current Outpatient Medications  Medication Sig Dispense Refill   Alpha-Lipoic Acid 300 MG CAPS Take 300 mg by mouth 2 (two) times daily.      aspirin EC 81 MG tablet Take 1 tablet (81 mg total) by mouth daily. 90 tablet 3   atorvastatin (LIPITOR) 40 MG tablet Take 1 tablet (40 mg total) by mouth daily at 6 PM. 90 tablet 3   B Complex-C (SUPER B COMPLEX/VITAMIN C) TABS 1 tablet by mouth daily     CVS D3 50 MCG (2000 UT) CAPS  TAKE 1 CAPSULE BY MOUTH EVERY DAY 90 capsule 2   dapagliflozin propanediol (FARXIGA) 5 MG TABS tablet Take 5 mg by mouth daily. 90 tablet 3   dorzolamide-timolol (COSOPT) 22.3-6.8 MG/ML ophthalmic solution Place 1 drop into both eyes 2 (two) times daily.     glucose blood (ONETOUCH VERIO) test strip Use to test blood sugar one time daily as instructed. 100 strip 11   Lancets (ONETOUCH DELICA PLUS LANCET30G) MISC USE TO TEST BLOOD SUGAR 3 TIMES DAILY AS INSTRUCTED. 100 each 4   latanoprost (XALATAN) 0.005 % ophthalmic solution Place 1 drop into both eyes at bedtime.      metFORMIN (GLUCOPHAGE) 1000 MG tablet TAKE 1 TABLET BY MOUTH TWICE A DAY WITH A MEAL 180 tablet 3   Current Facility-Administered Medications  Medication Dose Route Frequency Provider Last Rate Last Admin   ketoconazole (NIZORAL) 2 % cream   Topical BID Plotnikov, Georgina Quint, MD        Allergies  Allergen Reactions   Penicillins     As a child      Social History   Socioeconomic History   Marital status: Married    Spouse name: Debbie   Number of children: 2   Years of education: Not on file   Highest education level: Not on file  Occupational History   Occupation: Self  employed - Teacher, 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  Tobacco Use   Smoking status: Never Smoker   Smokeless tobacco: Never Used  Substance and Sexual Activity   Alcohol use: No   Drug use: No   Sexual activity: Not on file  Other Topics Concern   Not on file  Social History Narrative   Married 38 years   Wife - Barbarann Ehlers 28   Daughter 59   Social Determinants of Corporate investment banker Strain:    Difficulty of Paying Living Expenses: Not on file  Food Insecurity:    Worried About Programme researcher, broadcasting/film/video in the Last Year: Not on file   [employer] of Food in the Last Year: Not on file  Transportation Needs:    Freight forwarder (Medical): Not on file   Lack of Transportation (Non-Medical): Not on file   Physical Activity:    Days of Exercise per Week: Not on file   Minutes of Exercise per Session: Not on file  Stress:    Feeling of Stress : Not on file  Social Connections:    Frequency of Communication with Friends and Family: Not on file   Frequency of Social Gatherings with Friends and Family: Not on file   Attends Religious Services: Not on file   Active Member of Clubs or Organizations: Not on file   Attends Banker Meetings: Not on file   Marital Status: Not on file  Intimate Partner Violence:    Fear of Current or Ex-Partner: Not on file   Emotionally Abused: Not on file   Physically Abused: Not on file   Sexually Abused: Not on file      Family History  Problem Relation Age of Onset   Diabetes Mother    Hypertension Mother    Diabetes Father    Heart disease Father    Diabetes Brother    Diabetes Sister     Vitals:   11/05/19 1019  BP: (!) 142/70  Pulse: 60  SpO2: 98%  Weight: 95 kg (209 lb 6.4 oz)     PHYSICAL EXAM: General:  Well appearing. No resp difficulty HEENT: normal Neck: supple. no JVD. Carotids 2+ bilat; no bruits. No lymphadenopathy or thryomegaly appreciated. Cor: PMI nondisplaced. Regular rate & rhythm. No rubs, gallops or murmurs. Lungs: clear Abdomen: soft, nontender, nondistended. No hepatosplenomegaly. No bruits or masses. Good bowel sounds. Extremities: no cyanosis, clubbing, rash, edema Neuro: alert & orientedx3, cranial nerves grossly intact. moves all 4 extremities w/o difficulty. Affect pleasant   ASSESSMENT & PLAN:  1. Bicuspid aortic valve with dilated aortic root.  - s/p Bentall aortic root replacement with bioprosthetic AVR on 01/10/19 at the [facility] - doing very well. NYHA I  - Off b-blocker due to bradycardia - discussed SBE prophylaxis - continue ASA 81  2. Post-op pericardial effusion - resolved   3. HTN - BP running high. Start losartan 25 daily  4. Type 2 DM - controlled   - On Farxiga and atorva - restart losartan 25  5. Heart twinges - suspect PVCs - if continues will place Zio - check lytes Arvilla Meres, MD 11/05/19

## 2019-11-05 ENCOUNTER — Encounter (HOSPITAL_COMMUNITY): Payer: Self-pay | Admitting: Internal Medicine

## 2019-11-05 ENCOUNTER — Other Ambulatory Visit: Payer: Self-pay

## 2019-11-05 ENCOUNTER — Ambulatory Visit (HOSPITAL_COMMUNITY)
Admission: RE | Admit: 2019-11-05 | Discharge: 2019-11-05 | Disposition: A | Discharge status: Home / Self Care | Payer: Medicare Other | Source: Ambulatory Visit | Attending: Internal Medicine | Admitting: Internal Medicine

## 2019-11-05 VITALS — BP 142/70 | HR 60 | Wt 209.4 lb

## 2019-11-05 DIAGNOSIS — Z953 Presence of xenogenic heart valve: Secondary | ICD-10-CM | POA: Insufficient documentation

## 2019-11-05 DIAGNOSIS — R0789 Other chest pain: Secondary | ICD-10-CM | POA: Insufficient documentation

## 2019-11-05 DIAGNOSIS — Z79899 Other long term (current) drug therapy: Secondary | ICD-10-CM | POA: Diagnosis not present

## 2019-11-05 DIAGNOSIS — Z7982 Long term (current) use of aspirin: Secondary | ICD-10-CM | POA: Diagnosis not present

## 2019-11-05 DIAGNOSIS — E119 Type 2 diabetes mellitus without complications: Secondary | ICD-10-CM | POA: Insufficient documentation

## 2019-11-05 DIAGNOSIS — Z7984 Long term (current) use of oral hypoglycemic drugs: Secondary | ICD-10-CM | POA: Insufficient documentation

## 2019-11-05 DIAGNOSIS — I2510 Atherosclerotic heart disease of native coronary artery without angina pectoris: Secondary | ICD-10-CM | POA: Insufficient documentation

## 2019-11-05 DIAGNOSIS — Z8249 Family history of ischemic heart disease and other diseases of the circulatory system: Secondary | ICD-10-CM | POA: Insufficient documentation

## 2019-11-05 DIAGNOSIS — Q231 Congenital insufficiency of aortic valve: Secondary | ICD-10-CM | POA: Diagnosis present

## 2019-11-05 DIAGNOSIS — I10 Essential (primary) hypertension: Secondary | ICD-10-CM | POA: Diagnosis not present

## 2019-11-05 LAB — BASIC METABOLIC PANEL
Anion gap: 8 (ref 5–15)
BUN: 13 mg/dL (ref 8–23)
CO2: 26 mmol/L (ref 22–32)
Calcium: 9.4 mg/dL (ref 8.9–10.3)
Chloride: 107 mmol/L (ref 98–111)
Creatinine, Ser: 1.02 mg/dL (ref 0.61–1.24)
GFR, Estimated: >60 mL/min (ref >60)
Glucose, Bld: 127 mg/dL — ABNORMAL HIGH (ref 70–99)
Potassium: 4.9 mmol/L (ref 3.5–5.1)
Sodium: 141 mmol/L (ref 135–145)

## 2019-11-05 LAB — MAGNESIUM: Magnesium: 2.2 mg/dL (ref 1.7–2.4)

## 2019-11-05 MED ORDER — LOSARTAN POTASSIUM 25 MG PO TABS: 25.0000 mg | ORAL_TABLET | Freq: Every day | ORAL | 3 refills | Status: DC

## 2019-11-05 NOTE — Patient Instructions (Signed)
Start Losartan 25 mg Daily  Labs done today, your results will be available in MyChart, we will contact you for abnormal readings.  Your physician recommends that you schedule a follow-up appointment in: 4 months with echocardiogram  If you have any questions or concerns before your next appointment please send [place] a message through [place] or call our office at [phone].    TO LEAVE A MESSAGE FOR THE NURSE SELECT OPTION 2, PLEASE LEAVE A MESSAGE INCLUDING: . YOUR NAME . DATE OF BIRTH . CALL BACK NUMBER . REASON FOR CALL**this is important as we prioritize the call backs  YOU WILL RECEIVE A CALL BACK THE SAME DAY AS LONG AS YOU CALL BEFORE 4:00 PM  At the [facility], you and your health needs are our priority. As part of our continuing mission to provide you with exceptional heart care, we have created designated Provider Care Teams. These Care Teams include your primary Cardiologist (physician) and Advanced Practice Providers (APPs- Physician Assistants and Nurse Practitioners) who all work together to provide you with the care you need, when you need it.   You may see any of the following providers on your designated Care Team at your next follow up: Marland Kitchen Dr Arvilla Meres . Dr Marca Ancona . Tonye Becket, NP . Robbie Lis, PA . Karle Plumber, PharmD   Please be sure to bring in all your medications bottles to every appointment.

## 2019-11-10 ENCOUNTER — Other Ambulatory Visit: Payer: Self-pay

## 2019-11-10 ENCOUNTER — Ambulatory Visit (INDEPENDENT_AMBULATORY_CARE_PROVIDER_SITE_OTHER): Payer: Medicare Other | Admitting: Internal Medicine

## 2019-11-10 ENCOUNTER — Encounter: Payer: Self-pay | Admitting: Internal Medicine

## 2019-11-10 VITALS — BP 130/78 | HR 64 | Temp 98.6°F | Ht 72.0 in | Wt 208.6 lb

## 2019-11-10 DIAGNOSIS — Z0000 Encounter for general adult medical examination without abnormal findings: Secondary | ICD-10-CM

## 2019-11-10 DIAGNOSIS — I2510 Atherosclerotic heart disease of native coronary artery without angina pectoris: Secondary | ICD-10-CM

## 2019-11-10 DIAGNOSIS — N320 Bladder-neck obstruction: Secondary | ICD-10-CM | POA: Diagnosis not present

## 2019-11-10 DIAGNOSIS — I10 Essential (primary) hypertension: Secondary | ICD-10-CM

## 2019-11-10 DIAGNOSIS — E1142 Type 2 diabetes mellitus with diabetic polyneuropathy: Secondary | ICD-10-CM | POA: Diagnosis not present

## 2019-11-10 DIAGNOSIS — E559 Vitamin D deficiency, unspecified: Secondary | ICD-10-CM

## 2019-11-10 NOTE — Assessment & Plan Note (Signed)
Dr Gala Romney minimal CAD of the pLAD and diagonal with 0-24% stenosis

## 2019-11-10 NOTE — Assessment & Plan Note (Signed)
Farxiga, Metformin Losartan - not taking, Lipitor

## 2019-11-10 NOTE — Assessment & Plan Note (Signed)
We discussed age appropriate health related issues, including available/recomended screening tests and vaccinations. We discussed a need for adhering to healthy diet and exercise. Labs were ordered to be later reviewed . All questions were answered. Cologuard 2019 Vaccines Card CT  2020 Eye exam q 12 mo

## 2019-11-10 NOTE — Progress Notes (Signed)
Subjective:  Patient ID: Clifford Alvarez, male    DOB: Jun 26, 1955  Age: 64 y.o. MRN: [id number]  CC: Annual Exam   HPI [name] presents for a well exam F/u CAD, HTN, AA  Outpatient Medications Prior to Visit  Medication Sig Dispense Refill  . Alpha-Lipoic Acid 300 MG CAPS Take 300 mg by mouth 2 (two) times daily.     Marland Kitchen aspirin EC 81 MG tablet Take 1 tablet (81 mg total) by mouth daily. 90 tablet 3  . atorvastatin (LIPITOR) 40 MG tablet Take 1 tablet (40 mg total) by mouth daily at 6 PM. 90 tablet 3  . B Complex-C (SUPER B COMPLEX/VITAMIN C) TABS 1 tablet by mouth daily    . CVS D3 50 MCG (2000 UT) CAPS TAKE 1 CAPSULE BY MOUTH EVERY DAY 90 capsule 2  . dapagliflozin propanediol (FARXIGA) 5 MG TABS tablet Take 5 mg by mouth daily. 90 tablet 3  . dorzolamide-timolol (COSOPT) 22.3-6.8 MG/ML ophthalmic solution Place 1 drop into both eyes 2 (two) times daily.    Marland Kitchen glucose blood (ONETOUCH VERIO) test strip Use to test blood sugar one time daily as instructed. 100 strip 11  . Lancets (ONETOUCH DELICA PLUS LANCET30G) MISC USE TO TEST BLOOD SUGAR 3 TIMES DAILY AS INSTRUCTED. 100 each 4  . latanoprost (XALATAN) 0.005 % ophthalmic solution Place 1 drop into both eyes at bedtime.     Marland Kitchen losartan (COZAAR) 25 MG tablet Take 1 tablet (25 mg total) by mouth daily. 90 tablet 3  . metFORMIN (GLUCOPHAGE) 1000 MG tablet TAKE 1 TABLET BY MOUTH TWICE A DAY WITH A MEAL 180 tablet 3  . ketoconazole (NIZORAL) 2 % cream      No facility-administered medications prior to visit.    ROS: Review of Systems  Constitutional: Negative for appetite change, fatigue and unexpected weight change.  HENT: Negative for congestion, nosebleeds, sneezing, sore throat and trouble swallowing.   Eyes: Negative for itching and visual disturbance.  Respiratory: Negative for cough.   Cardiovascular: Negative for chest pain, palpitations and leg swelling.  Gastrointestinal: Negative for abdominal distention,  blood in stool, diarrhea and nausea.  Genitourinary: Negative for frequency and hematuria.  Musculoskeletal: Negative for back pain, gait problem, joint swelling and neck pain.  Skin: Negative for rash.  Neurological: Negative for dizziness, tremors, speech difficulty and weakness.  Psychiatric/Behavioral: Negative for agitation, dysphoric mood, sleep disturbance and suicidal ideas. The patient is not nervous/anxious.     Objective:  BP 130/78 (BP Location: Left Arm)   Pulse 64   Temp 98.6 F (37 C) (Oral)   Ht 6' (1.829 m)   Wt 208 lb 9.6 oz (94.6 kg)   SpO2 94%   BMI 28.29 kg/m   BP Readings from Last 3 Encounters:  11/10/19 130/78  11/05/19 (!) 142/70  07/21/19 (!) 146/74    Wt Readings from Last 3 Encounters:  11/10/19 208 lb 9.6 oz (94.6 kg)  11/05/19 209 lb 6.4 oz (95 kg)  07/21/19 206 lb (93.4 kg)    Physical Exam Constitutional:      General: He is not in acute distress.    Appearance: He is well-developed.     Comments: NAD  Eyes:     Conjunctiva/sclera: Conjunctivae normal.     Pupils: Pupils are equal, round, and reactive to light.  Neck:     Thyroid: No thyromegaly.     Vascular: No JVD.  Cardiovascular:     Rate and Rhythm: Normal rate  and regular rhythm.     Heart sounds: Normal heart sounds. No murmur heard.  No friction rub. No gallop.   Pulmonary:     Effort: Pulmonary effort is normal. No respiratory distress.     Breath sounds: Normal breath sounds. No wheezing or rales.  Chest:     Chest wall: No tenderness.  Abdominal:     General: Bowel sounds are normal. There is no distension.     Palpations: Abdomen is soft. There is no mass.     Tenderness: There is no abdominal tenderness. There is no guarding or rebound.  Musculoskeletal:        General: No tenderness. Normal range of motion.     Cervical back: Normal range of motion.  Lymphadenopathy:     Cervical: No cervical adenopathy.  Skin:    General: Skin is warm and dry.     Findings:  No rash.  Neurological:     Mental Status: He is alert and oriented to person, place, and time.     Cranial Nerves: No cranial nerve deficit.     Motor: No abnormal muscle tone.     Coordination: Coordination normal.     Gait: Gait normal.     Deep Tendon Reflexes: Reflexes are normal and symmetric.  Psychiatric:        Behavior: Behavior normal.        Thought Content: Thought content normal.        Judgment: Judgment normal.    Rectal deferred   Lab Results  Component Value Date   WBC 6.2 07/09/2019   HGB 14.6 07/09/2019   HCT 44.0 07/09/2019   PLT 143.0 (L) 07/09/2019   GLUCOSE 127 (H) 11/05/2019   CHOL 113 07/09/2019   TRIG 102.0 07/09/2019   HDL 37.20 (L) 07/09/2019   LDLDIRECT 84.0 11/04/2018   LDLCALC 56 07/09/2019   ALT 14 07/09/2019   AST 17 07/09/2019   NA 141 11/05/2019   K 4.9 11/05/2019   CL 107 11/05/2019   CREATININE 1.02 11/05/2019   BUN 13 11/05/2019   CO2 26 11/05/2019   TSH 3.25 07/09/2019   PSA 0.72 07/09/2019   HGBA1C 6.4 07/09/2019   MICROALBUR <0.7 07/09/2019    No results found.  Assessment & Plan:    Sonda Primes, MD

## 2019-11-10 NOTE — Assessment & Plan Note (Signed)
Losartan 

## 2019-11-10 NOTE — Assessment & Plan Note (Signed)
Vit d 

## 2019-12-08 ENCOUNTER — Encounter: Payer: Self-pay | Admitting: Internal Medicine

## 2019-12-08 ENCOUNTER — Other Ambulatory Visit: Payer: Self-pay

## 2019-12-08 ENCOUNTER — Ambulatory Visit (INDEPENDENT_AMBULATORY_CARE_PROVIDER_SITE_OTHER): Payer: Medicare Other | Admitting: Internal Medicine

## 2019-12-08 VITALS — BP 110/70 | HR 70 | Ht 77.0 in | Wt 206.2 lb

## 2019-12-08 DIAGNOSIS — E7800 Pure hypercholesterolemia, unspecified: Secondary | ICD-10-CM | POA: Diagnosis not present

## 2019-12-08 DIAGNOSIS — E1142 Type 2 diabetes mellitus with diabetic polyneuropathy: Secondary | ICD-10-CM

## 2019-12-08 DIAGNOSIS — I2510 Atherosclerotic heart disease of native coronary artery without angina pectoris: Secondary | ICD-10-CM | POA: Diagnosis not present

## 2019-12-08 DIAGNOSIS — G63 Polyneuropathy in diseases classified elsewhere: Secondary | ICD-10-CM

## 2019-12-08 LAB — POCT GLYCOSYLATED HEMOGLOBIN (HGB A1C): Hemoglobin A1C: 5.9 % — AB (ref 4.0–5.6)

## 2019-12-08 NOTE — Addendum Note (Signed)
Addended by: Kenyon Ana on: 12/08/2019 09:34 AM   Modules accepted: Orders

## 2019-12-08 NOTE — Patient Instructions (Signed)
Please  continue: - Metformin 1000 mg 2x a day with meals - Farxiga 5 mg daily before breakfast   Check sugars later in the day, also, rotating check times.  Please return in 5-6 months with your sugar log.

## 2019-12-08 NOTE — Progress Notes (Signed)
Patient ID: Clifford EdmanRobert Steven Legore, male   DOB: 09/08/1955, 64 y.o.   MRN: [id number]  This visit occurred during the SARS-CoV-2 public health emergency.  Safety protocols were in place, including screening questions prior to the visit, additional usage of staff PPE, and extensive cleaning of exam room while observing appropriate contact time as indicated for disinfecting solutions.   HPI: Clifford Alvarez is a 64 y.o.-year-old male, returning for f/u for DM2 dx 01/28/2013, previously insulin-dependent, now off insulin, controlled, with complications (Peripheral neuropathy). Last visit was 5 months ago.  He is exercising daily >> 3 mi every day.  Reviewed HbA1c levels: Lab Results  Component Value Date   HGBA1C 6.4 07/09/2019   HGBA1C 6.4 (A) 06/30/2019   HGBA1C 6.0 (A) 02/12/2019   HGBA1C 6.0 (A) 11/11/2018   HGBA1C 6.1 (A) 05/20/2018   HGBA1C 5.8 (A) 11/19/2017   HGBA1C 6.0 05/14/2017   HGBA1C 6 01/08/2017   HGBA1C 6.3 10/26/2016   HGBA1C 5.7 08/07/2016   HGBA1C 6.0 04/17/2016   HGBA1C 6.0 12/13/2015   HGBA1C 5.9 08/12/2015   HGBA1C 5.9 04/13/2015   HGBA1C 5.8 12/15/2014   HGBA1C 5.9 08/17/2014   HGBA1C 6.1 05/11/2014   HGBA1C 6.3 02/11/2014   HGBA1C 6.1 11/10/2013   HGBA1C 6.5 08/08/2013   He is on: - Metformin 1000 mg 2x a day with meals - Jardiance 25 mg in am >> Farxiga 10 mg in a.m. >> Invokana 100,  then 300 mg daily - changed 11/2018 per insurance preference >> but stopped 2-3 weeks ago  - he [facility] >> changed back to [place] 5 mg daily in 02/2019. He was previously on Basaglar 12 units at bedtime >> stopped 08/2016 >> restarted: 6-8 units 3x a day >> now off completely.  Pt checks his sugars once a day - am:  120, 130-160 >> 119-144, 155 >> 115-145 >> 110-140s - 2h after b'fast: 146 >> 197 >> n/c  - before lunch:  102, 128 >> n/c - 2h after lunch: 1n/c >> 126 >> n/c - before dinner: n/c >> n/c >> 124-142  >> n/c - 2h after dinner:  110-140s >> 130-140 >> n/c - bedtime:  120-174 >> n/c - nighttime:  123-145, 170 >> n/c Lowest sugar was 115 >> 120 >> 119 >> 115 >> 110.  He  has hypoglycemia awareness in the 70s. Highest sugar was 160 >> 170 >> 168 >> 160s >> 170 (Txgiving).  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  Pt's meals are: - Breakfast: bagel, oatmeal, granola, eggs - Lunch: meat + 2-3 vegetables; soup; salad; sandwich - Dinner: meat + 2-3 vegetables; pasta; [place]; seafood - Snacks: 1 a day >> nuts, popcorn, dark chocolate  -No CKD, last BUN/creatinine:  Lab Results  Component Value Date   BUN 13 11/05/2019   CREATININE 1.02 11/05/2019   Lab Results  Component Value Date   GFRNONAA >60 11/05/2019   GFRNONAA >60 01/24/2019   GFRNONAA >89 04/13/2015   No available: Lab Results  Component Value Date   MICRALBCREAT 0.5 07/09/2019   MICRALBCREAT 0.6 11/04/2018   MICRALBCREAT 1.6 10/29/2017   MICRALBCREAT 0.8 10/26/2016   MICRALBCREAT 0.6 04/13/2015   MICRALBCREAT 0.7 05/11/2014   MICRALBCREAT 0.3 03/12/2013  On losartan.  -+ HL; reviewed lipids: Lab Results  Component Value Date   CHOL 113 07/09/2019   HDL 37.20 (L) 07/09/2019   LDLCALC 56 07/09/2019   LDLDIRECT 84.0 11/04/2018  TRIG 102.0 07/09/2019   CHOLHDL 3 07/09/2019  On Lipitor 40. - last eye exam was on 07/24/2019 at [facility]: + Glaucoma OS - VF improved, no DR. Dr. Lottie Dawson.  He is seen every 4 mo - has frequent VF tests. -He has numbness but no tingling in his feet.  Next week he is going to the [place] for family vacation.  ROS: Constitutional: no weight gain/no weight loss, no fatigue, no subjective hyperthermia, no subjective hypothermia Eyes: no blurry vision, no xerophthalmia ENT: no sore throat, no nodules palpated in neck, no dysphagia, no odynophagia, no hoarseness Cardiovascular: no CP/no SOB/no palpitations/no leg swelling Respiratory: no cough/no SOB/no wheezing Gastrointestinal: no  N/no V/no D/no C/no acid reflux Musculoskeletal: no muscle aches/no joint aches Skin: no rashes, no hair loss Neurological: no tremors/+ numbness/no tingling/no dizziness  I reviewed pt's medications, allergies, PMH, social hx, family hx, and changes were documented in the history of present illness. Otherwise, unchanged from my initial visit note.  Past Medical History:  Diagnosis Date  . Ascending aortic aneurysm (HCC)    5.4cm ascending aorta by Chest CT 12/2018, s/p thoracic aortic aneurysm repair with Biobentall 69mm Perimount Valve, 25mm graft  . Bicuspid aortic valve    no AS or AI on echo 12/2018  . CAD (coronary artery disease), native coronary artery    minimal CAD of the pLAD and diagonal with 0-24% stenosis  . Coronary artery calcification seen on CAT scan    calcium score 11 12/2018  . Diabetes mellitus without complication (HCC)   . Glaucoma   . Heart murmur    History of childhood heart murmur (Aortic)   Past Surgical History:  Procedure Laterality Date  . BENTALL PROCEDURE  01/10/2019   at [facility]  . COLONOSCOPY  2006   Dr. Matthias Hughs   Social History   Socioeconomic History  . Marital status: Married    Spouse name: Eunice Blase  . Number of children: 2  . Years of education: Not on file  . Highest education level: Not on file  Occupational History  . 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 Smoking status: Never Smoker  . Smokeless tobacco: Never Used  Substance and Sexual Activity  . Alcohol use: No  . Drug use: No  . Sexual activity: Not on file  Other Topics Concern  . Not on file  Social History Narrative   Married 38 years   Wife - Barbarann Ehlers 51   Daughter 36   Social Determinants of Corporate investment banker Strain:   . Difficulty of Paying Living Expenses: Not on file  Food Insecurity:   . Worried About Programme researcher, broadcasting/film/video in the Last Year: Not on file  . Ran Out of Food in the Last Year:  Not on file  Transportation Needs:   . Lack of Transportation (Medical): Not on file  . Lack of Transportation (Non-Medical): Not on file  Physical Activity:   . Days of Exercise per Week: Not on file  . Minutes of Exercise per Session: Not on file  Stress:   . Feeling of Stress : Not on file  Social Connections:   . Frequency of Communication with Friends and Family: Not on file  . Frequency of Social Gatherings with Friends and Family: Not on file  . Attends Religious Services: Not on file  . Active Member of Clubs or Organizations: Not on file  . Attends  Club or Organization Meetings: Not on file  . Marital Status: Not on file  Intimate Partner Violence:   . Fear of Current or Ex-Partner: Not on file  . Emotionally Abused: Not on file  . Physically Abused: Not on file  . Sexually Abused: Not on file   Current Outpatient Medications on File Prior to Visit  Medication Sig Dispense Refill  . Alpha-Lipoic Acid 300 MG CAPS Take 300 mg by mouth 2 (two) times daily.     Marland Kitchen aspirin EC 81 MG tablet Take 1 tablet (81 mg total) by mouth daily. 90 tablet 3  . atorvastatin (LIPITOR) 40 MG tablet Take 1 tablet (40 mg total) by mouth daily at 6 PM. 90 tablet 3  . B Complex-C (SUPER B COMPLEX/VITAMIN C) TABS 1 tablet by mouth daily    . [facility] D3 50 MCG (2000 UT) CAPS TAKE 1 CAPSULE BY MOUTH EVERY DAY 90 capsule 2  . dapagliflozin propanediol (FARXIGA) 5 MG TABS tablet Take 5 mg by mouth daily. 90 tablet 3  . dorzolamide-timolol (COSOPT) 22.3-6.8 MG/ML ophthalmic solution Place 1 drop into both eyes 2 (two) times daily.    Marland Kitchen glucose blood (ONETOUCH VERIO) test strip Use to test blood sugar one time daily as instructed. 100 strip 11  . Lancets (ONETOUCH DELICA PLUS LANCET30G) MISC USE TO TEST BLOOD SUGAR 3 TIMES DAILY AS INSTRUCTED. 100 each 4  . latanoprost (XALATAN) 0.005 % ophthalmic solution Place 1 drop into both eyes at bedtime.     Marland Kitchen losartan (COZAAR) 25 MG tablet Take 1 tablet (25 mg total)  by mouth daily. 90 tablet 3  . metFORMIN (GLUCOPHAGE) 1000 MG tablet TAKE 1 TABLET BY MOUTH TWICE A DAY WITH A MEAL 180 tablet 3   No current facility-administered medications on file prior to visit.   Allergies  Allergen Reactions  . Penicillins     As a child   Family History  Problem Relation Age of Onset  . Diabetes Mother   . Hypertension Mother   . Diabetes Father   . Heart disease Father   . Diabetes Brother   . Diabetes Sister     PE: BP 110/70   Pulse 70   Ht 6\' 5"  (1.956 m)   Wt 206 lb 3.2 oz (93.5 kg)   SpO2 97%   BMI 24.45 kg/m  Body mass index is 24.45 kg/m.  Wt Readings from Last 3 Encounters:  12/08/19 206 lb 3.2 oz (93.5 kg)  11/10/19 208 lb 9.6 oz (94.6 kg)  11/05/19 209 lb 6.4 oz (95 kg)   Constitutional: overweight, in NAD Eyes: PERRLA, EOMI, no exophthalmos ENT: moist mucous membranes, no thyromegaly, no cervical lymphadenopathy Cardiovascular: RRR, No MRG Respiratory: CTA B Gastrointestinal: abdomen soft, NT, ND, BS+ Musculoskeletal: no deformities, strength intact in all 4 Skin: moist, warm, no rashes Neurological: no tremor with outstretched hands, DTR normal in all 4  ASSESSMENT: 1. DM2, insulin-independent, controlled, with complications  - PN -stable  2. PN 2/2 diabetes  3. HL  PLAN:  1. Patient with controlled diabetes, on oral medication regimen with Metformin and low-dose SGLT2 inhibitor.  He status post aortic aneurysm repair repair at [facility] in 01/20.  After the surgery, he lost 15 pounds but before last visit he gained 4 back.  At that time, her HbA1c was slightly higher, at 6.4%.  His sugars were at or slightly higher than goal in the morning depending on whether she ate starches with dinner or not, but  he occasionally had blood sugars in the 140s without necessarily having had starches the night before.  We discussed about starting to walk for exercise, which can help with morning hypoglycemia.  At that time he was only  checking sugars in the morning but I advised him to rotate the blood sugar checks throughout the day. -At today's visit, sugars are at goal or slightly higher in the morning and he is not checking sugars later in the day to understand trends. However,we checked his HbA1c: 5.9% (lower, excellent) -No need to change his regimen for now but I again advised him to try to check some sugars later in the day, also, rotating check times - I suggested to: Patient Instructions  Please  continue: - Metformin 1000 mg 2x a day with meals - Farxiga 5 mg daily before breakfast   Please return in 5-6 months with your sugar log.   - advised for yearly eye exams >> he is UTD - return to clinic in 5-6 months  2. PN 2/2 DM  -He has numbness in his feet but no pain -He is on alpha-lipoic acid and B complex, which are helping  3. HL -Reviewed latest lipid panel from 07/2019: LDL at goal, HDL slightly low: Lab Results  Component Value Date   CHOL 113 07/09/2019   HDL 37.20 (L) 07/09/2019   LDLCALC 56 07/09/2019   LDLDIRECT 84.0 11/04/2018   TRIG 102.0 07/09/2019   CHOLHDL 3 07/09/2019  -Continues Lipitor 40 without side effects  [name], MD PhD [facility] Endocrinology

## 2019-12-09 ENCOUNTER — Ambulatory Visit: Payer: BLUE CROSS/BLUE SHIELD | Admitting: Internal Medicine

## 2019-12-25 ENCOUNTER — Other Ambulatory Visit: Payer: Self-pay | Admitting: Cardiology

## 2020-02-16 ENCOUNTER — Other Ambulatory Visit: Payer: Self-pay | Admitting: Internal Medicine

## 2020-03-04 ENCOUNTER — Encounter (HOSPITAL_COMMUNITY): Payer: Self-pay | Admitting: Internal Medicine

## 2020-03-04 ENCOUNTER — Other Ambulatory Visit: Payer: Self-pay

## 2020-03-04 ENCOUNTER — Ambulatory Visit (HOSPITAL_COMMUNITY)
Admission: RE | Admit: 2020-03-04 | Discharge: 2020-03-04 | Disposition: A | Discharge status: Home / Self Care | Payer: Medicare Other | Source: Ambulatory Visit | Attending: Internal Medicine | Admitting: Internal Medicine

## 2020-03-04 ENCOUNTER — Other Ambulatory Visit (HOSPITAL_COMMUNITY): Payer: Self-pay | Admitting: Internal Medicine

## 2020-03-04 ENCOUNTER — Ambulatory Visit (HOSPITAL_BASED_OUTPATIENT_CLINIC_OR_DEPARTMENT_OTHER)
Admission: RE | Admit: 2020-03-04 | Discharge: 2020-03-04 | Disposition: A | Discharge status: Home / Self Care | Payer: Medicare Other | Source: Ambulatory Visit | Attending: Internal Medicine | Admitting: Internal Medicine

## 2020-03-04 VITALS — BP 110/68 | HR 60 | Wt 203.2 lb

## 2020-03-04 DIAGNOSIS — E785 Hyperlipidemia, unspecified: Secondary | ICD-10-CM | POA: Diagnosis not present

## 2020-03-04 DIAGNOSIS — I2510 Atherosclerotic heart disease of native coronary artery without angina pectoris: Secondary | ICD-10-CM | POA: Diagnosis not present

## 2020-03-04 DIAGNOSIS — I110 Hypertensive heart disease with heart failure: Secondary | ICD-10-CM | POA: Diagnosis present

## 2020-03-04 DIAGNOSIS — R002 Palpitations: Secondary | ICD-10-CM

## 2020-03-04 DIAGNOSIS — Q231 Congenital insufficiency of aortic valve: Secondary | ICD-10-CM

## 2020-03-04 DIAGNOSIS — Z880 Allergy status to penicillin: Secondary | ICD-10-CM | POA: Insufficient documentation

## 2020-03-04 DIAGNOSIS — Z7982 Long term (current) use of aspirin: Secondary | ICD-10-CM | POA: Insufficient documentation

## 2020-03-04 DIAGNOSIS — I509 Heart failure, unspecified: Secondary | ICD-10-CM | POA: Insufficient documentation

## 2020-03-04 DIAGNOSIS — Z7984 Long term (current) use of oral hypoglycemic drugs: Secondary | ICD-10-CM | POA: Diagnosis not present

## 2020-03-04 DIAGNOSIS — I10 Essential (primary) hypertension: Secondary | ICD-10-CM

## 2020-03-04 DIAGNOSIS — Z953 Presence of xenogenic heart valve: Secondary | ICD-10-CM | POA: Insufficient documentation

## 2020-03-04 DIAGNOSIS — Z79899 Other long term (current) drug therapy: Secondary | ICD-10-CM | POA: Insufficient documentation

## 2020-03-04 DIAGNOSIS — E119 Type 2 diabetes mellitus without complications: Secondary | ICD-10-CM | POA: Diagnosis not present

## 2020-03-04 HISTORY — DX: Heart failure, unspecified: I50.9

## 2020-03-04 LAB — ECHOCARDIOGRAM COMPLETE
AR max vel: 0.98 cm2
AV Area VTI: 1.02 cm2
AV Area mean vel: 0.93 cm2
AV Mean grad: 14 mmHg
AV Peak grad: 24.4 mmHg
Ao pk vel: 2.47 m/s
Area-P 1/2: 4.06 cm2
S' Lateral: 3.8 cm

## 2020-03-04 MED ORDER — LOSARTAN POTASSIUM 50 MG PO TABS: 50.0000 mg | ORAL_TABLET | Freq: Every day | ORAL | 11 refills | Status: DC

## 2020-03-04 NOTE — Addendum Note (Signed)
Encounter addended by: Noralee Space, RN on: 03/04/2020 11:18 AM  Actions taken: Order list changed, Clinical Note Signed

## 2020-03-04 NOTE — Patient Instructions (Signed)
Increase Losartan to 50 mg Daily  Your provider has recommended that  you wear a Zio Patch for 14 days.  This monitor will record your heart rhythm for our review.  IF you have any symptoms while wearing the monitor please press the button.  If you have any issues with the patch or you notice a red or orange light on it please call the company at [phone].  Once you remove the patch please mail it back to the company as soon as possible so we can get the results.  Please call our office in August 2022 to schedule your follow up appointment  If you have any questions or concerns before your next appointment please send [place] a message through [place] or call our office at [phone].    TO LEAVE A MESSAGE FOR THE NURSE SELECT OPTION 2, PLEASE LEAVE A MESSAGE INCLUDING: . YOUR NAME . DATE OF BIRTH . CALL BACK NUMBER . REASON FOR CALL**this is important as we prioritize the call backs  YOU WILL RECEIVE A CALL BACK THE SAME DAY AS LONG AS YOU CALL BEFORE 4:00 PM  At the [facility], you and your health needs are our priority. As part of our continuing mission to provide you with exceptional heart care, we have created designated Provider Care Teams. These Care Teams include your primary Cardiologist (physician) and Advanced Practice Providers (APPs- Physician Assistants and Nurse Practitioners) who all work together to provide you with the care you need, when you need it.   You may see any of the following providers on your designated Care Team at your next follow up: Marland Kitchen Dr Arvilla Meres . Dr Marca Ancona . Dr Thornell Mule . Tonye Becket, NP . Robbie Lis, PA . Shanda Bumps [place],NP . Karle Plumber, PharmD   Please be sure to bring in all your medications bottles to every appointment.

## 2020-03-04 NOTE — Progress Notes (Signed)
  Echocardiogram 2D Echocardiogram has been performed.  Clifford Alvarez 03/04/2020, 9:39 AM

## 2020-03-04 NOTE — Progress Notes (Signed)
[facility] Note    PCP: Plotnikov, Georgina Quint, MD   HPI:  Clifford Alvarez is a 65 y/o male with h/o HTN, DM2 with bicuspid aortic valve with dilated aortic root. Underwent Bentall aortic root replacement with bioprosthetic AVR on 01/10/19 at the [facility].  Pre-op cardiac CTA. No significant CAD Calcium score 11  Post-op course relatively uncomplicated. Had moderate pericardial effusion on post-op echo and started on colchicine. Resolved on f/u echo.   Here for routine follow-up. Doing great. Very active walking 3-5 miles per day on TM at 4 miles/hour. No CP or SOB. Occasionally get a brief period of swimmy headedness and then goes away. Lasts just a few seconds and goes away. No palpitations or any other symptoms or triggers. SBP 130-140s.   Echo today 03/04/20 EF 60-65. Normal diastolic function. RV ok. Bioprosthetic AVR ok mean gradient . Personally reviewed    Past Medical History:  Diagnosis Date  . Ascending aortic aneurysm (HCC)    5.4cm ascending aorta by Chest CT 12/2018, s/p thoracic aortic aneurysm repair with Biobentall 64mm Perimount Valve, 85mm graft  . Bicuspid aortic valve    no AS or AI on echo 12/2018  . CAD (coronary artery disease), native coronary artery    minimal CAD of the pLAD and diagonal with 0-24% stenosis  . CHF (congestive heart failure) (HCC)   . Coronary artery calcification seen on CAT scan    calcium score 11 12/2018  . Diabetes mellitus without complication (HCC)   . Glaucoma   . Heart murmur    History of childhood heart murmur (Aortic)    Current Outpatient Medications  Medication Sig Dispense Refill  . Alpha-Lipoic Acid 300 MG TABS Take 2 tablets by mouth daily.    Marland Kitchen aspirin EC 81 MG tablet Take 1 tablet (81 mg total) by mouth daily. 90 tablet 3  . atorvastatin (LIPITOR) 40 MG tablet TAKE 1 TABLET (40 MG TOTAL) BY MOUTH DAILY AT 6 PM. 90 tablet 0  . B Complex-C (SUPER B COMPLEX/VITAMIN C) TABS 1 tablet by mouth daily     . [facility]S D3 50 MCG (2000 UT) CAPS TAKE 1 CAPSULE BY MOUTH EVERY DAY 90 capsule 2  . dorzolamide-timolol (COSOPT) 22.3-6.8 MG/ML ophthalmic solution Place 1 drop into both eyes 2 (two) times daily.    Marland Kitchen FARXIGA 5 MG TABS tablet TAKE 1 TABLET BY MOUTH EVERY DAY 90 tablet 3  . glucose blood (ONETOUCH VERIO) test strip Use to test blood sugar one time daily as instructed. 100 strip 11  . Lancets (ONETOUCH DELICA PLUS LANCET30G) MISC USE TO TEST BLOOD SUGAR 3 TIMES DAILY AS INSTRUCTED. 100 each 4  . latanoprost (XALATAN) 0.005 % ophthalmic solution Place 1 drop into both eyes at bedtime.     Marland Kitchen losartan (COZAAR) 25 MG tablet Take 1 tablet (25 mg total) by mouth daily. 90 tablet 3  . metFORMIN (GLUCOPHAGE) 1000 MG tablet TAKE 1 TABLET BY MOUTH TWICE A DAY WITH A MEAL 180 tablet 3   No current facility-administered medications for this encounter.    Allergies  Allergen Reactions  . Penicillins     As a child      Social History   Socioeconomic History  . Marital status: Married    Spouse name: Clifford Alvarez  . Number of children: 2  . Years of education: Not on file  . Highest education level: Not on file  Occupational History  . Occupation: Self employed Psychologist, sport and exercise  Comment: Highway maintenance  Tobacco Use  . Smoking status: Never Smoker  . Smokeless tobacco: Never Used  Substance and Sexual Activity  . Alcohol use: No  . Drug use: No  . Sexual activity: Not on file  Other Topics Concern  . Not on file  Social History Narrative   Married 38 years   Wife - Clifford Alvarez 40   Daughter 65   Social Determinants of Corporate investment banker Strain: Not on [employer] Insecurity: Not on file  Transportation Needs: Not on file  Physical Activity: Not on file  Stress: Not on file  Social Connections: Not on file  Intimate Partner Violence: Not on file      Family History  Problem Relation Age of Onset  . Diabetes Mother   . Hypertension Mother   . Diabetes Father   . Heart  disease Father   . Diabetes Brother   . Diabetes Sister     Vitals:   03/04/20 1017  BP: 110/68  Pulse: 60  SpO2: 97%  Weight: 92.2 kg (203 lb 3.2 oz)     PHYSICAL EXAM: General:  Well appearing. No resp difficulty HEENT: normal Neck: supple. no JVD. Carotids 2+ bilat; no bruits. No lymphadenopathy or thryomegaly appreciated. Cor: PMI nondisplaced. Regular rate & rhythm. No rubs, gallops or murmurs. Lungs: clear Abdomen: soft, nontender, nondistended. No hepatosplenomegaly. No bruits or masses. Good bowel sounds. Extremities: no cyanosis, clubbing, rash, edema Neuro: alert & orientedx3, cranial nerves grossly intact. moves all 4 extremities w/o difficulty. Affect pleasant  NSR 62 nonspecific ST-T wave abnormalities. Personally reviewed   ASSESSMENT & PLAN:  1. Bicuspid aortic valve with dilated aortic root.  - s/p Bentall aortic root replacement with bioprosthetic AVR on 01/10/19 at the [facility] - Echo today 03/04/20 EF 60-65. Normal diastolic function. RV ok. Bioprosthetic AVR ok mean gradient . Personally reviewed - doing great NYHA I - Off b-blocker due to bradycardia - Using SBE prophylaxis - continue ASA 81  2. HTN - BP running high. Increase losartan 50 daily  3. Type 2 DM - controlled  - On Farxiga and atorva - restart losartan 25  4. Heart twinges - suspect possible PVCs - has LAE and at risk for AF. Will place [place] Crane, MD 03/04/20

## 2020-03-14 ENCOUNTER — Encounter (HOSPITAL_COMMUNITY): Payer: Self-pay

## 2020-04-01 ENCOUNTER — Encounter (HOSPITAL_COMMUNITY): Payer: Self-pay

## 2020-04-05 ENCOUNTER — Other Ambulatory Visit: Payer: Self-pay | Admitting: Cardiology

## 2020-04-14 ENCOUNTER — Other Ambulatory Visit: Payer: Self-pay | Admitting: Internal Medicine

## 2020-04-20 ENCOUNTER — Telehealth: Payer: Self-pay

## 2020-04-20 NOTE — Telephone Encounter (Signed)
Inbound fax from [facility] requesting office notes.  OV notes faxed to [phone]

## 2020-05-19 ENCOUNTER — Encounter: Payer: Self-pay | Admitting: Internal Medicine

## 2020-05-19 DIAGNOSIS — E1142 Type 2 diabetes mellitus with diabetic polyneuropathy: Secondary | ICD-10-CM

## 2020-05-20 MED ORDER — ONETOUCH VERIO VI STRP: ORAL_STRIP | 0 refills | Status: DC

## 2020-06-01 ENCOUNTER — Encounter: Payer: Self-pay | Admitting: Internal Medicine

## 2020-06-01 ENCOUNTER — Ambulatory Visit (INDEPENDENT_AMBULATORY_CARE_PROVIDER_SITE_OTHER): Payer: Medicare Other | Admitting: Internal Medicine

## 2020-06-01 ENCOUNTER — Other Ambulatory Visit: Payer: Self-pay

## 2020-06-01 VITALS — BP 128/82 | HR 62 | Ht 77.0 in | Wt 205.4 lb

## 2020-06-01 DIAGNOSIS — E7800 Pure hypercholesterolemia, unspecified: Secondary | ICD-10-CM

## 2020-06-01 DIAGNOSIS — E1142 Type 2 diabetes mellitus with diabetic polyneuropathy: Secondary | ICD-10-CM | POA: Diagnosis not present

## 2020-06-01 DIAGNOSIS — G63 Polyneuropathy in diseases classified elsewhere: Secondary | ICD-10-CM | POA: Diagnosis not present

## 2020-06-01 LAB — POCT GLYCOSYLATED HEMOGLOBIN (HGB A1C): Hemoglobin A1C: 5.9 % — AB (ref 4.0–5.6)

## 2020-06-01 MED ORDER — ONETOUCH VERIO VI STRP: ORAL_STRIP | 3 refills | Status: DC

## 2020-06-01 MED ORDER — METFORMIN HCL 1000 MG PO TABS: ORAL_TABLET | ORAL | 3 refills | Status: DC

## 2020-06-01 NOTE — Progress Notes (Signed)
Patient ID: Clifford Alvarez, male   DOB: 01/05/1955, 65 y.o.   MRN: [id number]  This visit occurred during the SARS-CoV-2 public health emergency.  Safety protocols were in place, including screening questions prior to the visit, additional usage of staff PPE, and extensive cleaning of exam room while observing appropriate contact time as indicated for disinfecting solutions.   HPI: Clifford Alvarez is a 65 y.o.-year-old male, returning for f/u for DM2 dx 01/28/2013, previously insulin-dependent, now off insulin, controlled, with complications (Peripheral neuropathy). Last visit was 5.5 months ago.  Interim history: She developed COVID-19 in 03/2020.  This was mild resolved without sequelae. No increased urination, blurry vision, nausea, chest pain.  Reviewed HbA1c levels: Lab Results  Component Value Date   HGBA1C 5.9 (A) 12/08/2019   HGBA1C 6.4 07/09/2019   HGBA1C 6.4 (A) 06/30/2019   HGBA1C 6.0 (A) 02/12/2019   HGBA1C 6.0 (A) 11/11/2018   HGBA1C 6.1 (A) 05/20/2018   HGBA1C 5.8 (A) 11/19/2017   HGBA1C 6.0 05/14/2017   HGBA1C 6 01/08/2017   HGBA1C 6.3 10/26/2016   HGBA1C 5.7 08/07/2016   HGBA1C 6.0 04/17/2016   HGBA1C 6.0 12/13/2015   HGBA1C 5.9 08/12/2015   HGBA1C 5.9 04/13/2015   HGBA1C 5.8 12/15/2014   HGBA1C 5.9 08/17/2014   HGBA1C 6.1 05/11/2014   HGBA1C 6.3 02/11/2014   HGBA1C 6.1 11/10/2013   He is on: - Metformin 1000 mg 2x a day with meals - Jardiance 25 mg in am >> Farxiga 10 mg in a.m. >> Invokana 100,  then 300 mg daily - changed 11/2018 per insurance preference  >> Farxiga 5 mg daily - 02/2019. He was previously on Basaglar 12 units at bedtime >> stopped 08/2016 >> restarted: 6-8 units 3x a day >> now off completely.  Pt checks his sugars once a day - am:  119-144, 155 >> 115-145 >> 110-140s >> 110-140s - 2h after b'fast: 146 >> 197 >> n/c  - before lunch:  102, 128 >> n/c - 2h after lunch: 1n/c >> 126 >> n/c - before dinner: n/c >> n/c >> 124-142 >>  n/c - 2h after dinner:  110-140s >> 130-140 >> n/c - bedtime:  120-174 >> n/c - nighttime:  123-145, 170 >> n/c Lowest sugar was 115 >> 110 >> 100s.  He  has hypoglycemia awareness in the 70s. Highest sugar was 160s >> 170 (Txgiving) >> <170.  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  Pt's meals are: - Breakfast: bagel, oatmeal, granola, eggs - Lunch: meat + 2-3 vegetables; soup; salad; sandwich - Dinner: meat + 2-3 vegetables; pasta; [place]; seafood - Snacks: 1 a day >> nuts, popcorn, dark chocolate  -No CKD, last BUN/creatinine:  Lab Results  Component Value Date   BUN 13 11/05/2019   CREATININE 1.02 11/05/2019   Lab Results  Component Value Date   GFRNONAA >60 11/05/2019   GFRNONAA >60 01/24/2019   GFRNONAA >89 04/13/2015   No available: Lab Results  Component Value Date   MICRALBCREAT 0.5 07/09/2019   MICRALBCREAT 0.6 11/04/2018   MICRALBCREAT 1.6 10/29/2017   MICRALBCREAT 0.8 10/26/2016   MICRALBCREAT 0.6 04/13/2015   MICRALBCREAT 0.7 05/11/2014   MICRALBCREAT 0.3 03/12/2013  On losartan.  -+ HL; reviewed lipids: Lab Results  Component Value Date   CHOL 113 07/09/2019   HDL 37.20 (L) 07/09/2019   LDLCALC 56 07/09/2019   LDLDIRECT 84.0 11/04/2018   TRIG 102.0 07/09/2019   CHOLHDL 3 07/09/2019  On Lipitor 40. - last eye exam was  on 04/2020 at [facility]: + Glaucoma OS - VF improved, no DR, + cataracts. Dr. Lottie Dawson.  He is seen every 4 mo - has frequent VF tests. -He has numbness but no tingling in his feet.  After last visit, he went to the [place] for family vacation.  ROS: Constitutional: no weight gain/no weight loss, no fatigue, no subjective hyperthermia, no subjective hypothermia Eyes: no blurry vision, no xerophthalmia ENT: no sore throat, no nodules palpated in neck, no dysphagia, no odynophagia, no hoarseness Cardiovascular: no CP/no SOB/no palpitations/no leg swelling Respiratory: no cough/no SOB/no wheezing Gastrointestinal: no N/no V/no D/no  C/no acid reflux Musculoskeletal: no muscle aches/no joint aches Skin: no rashes, no hair loss Neurological: no tremors/+ numbness/no tingling/no dizziness  I reviewed pt's medications, allergies, PMH, social hx, family hx, and changes were documented in the history of present illness. Otherwise, unchanged from my initial visit note.  Past Medical History:  Diagnosis Date  . Ascending aortic aneurysm (HCC)    5.4cm ascending aorta by Chest CT 12/2018, s/p thoracic aortic aneurysm repair with Biobentall 57mm Perimount Valve, 70mm graft  . Bicuspid aortic valve    no AS or AI on echo 12/2018  . CAD (coronary artery disease), native coronary artery    minimal CAD of the pLAD and diagonal with 0-24% stenosis  . CHF (congestive heart failure) (HCC)   . Coronary artery calcification seen on CAT scan    calcium score 11 12/2018  . Diabetes mellitus without complication (HCC)   . Glaucoma   . Heart murmur    History of childhood heart murmur (Aortic)   Past Surgical History:  Procedure Laterality Date  . BENTALL PROCEDURE  01/10/2019   at [facility]  . COLONOSCOPY  2006   Dr. Matthias Hughs   Social History   Socioeconomic History  . Marital status: Married    Spouse name: Eunice Blase  . Number of children: 2  . Years of education: Not on file  . Highest education level: Not on file  Occupational History  . 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 Smoking status: Never Smoker  . Smokeless tobacco: Never Used  Substance and Sexual Activity  . Alcohol use: No  . Drug use: No  . Sexual activity: Not on file  Other Topics Concern  . Not on file  Social History Narrative   Married 38 years   Wife - Barbarann Ehlers 99   Daughter 84   Social Determinants of Corporate investment banker Strain: Not on [employer] Insecurity: Not on file  Transportation Needs: Not on file  Physical Activity: Not on file  Stress: Not on file  Social  Connections: Not on file  Intimate Partner Violence: Not on file   Current Outpatient Medications on File Prior to Visit  Medication Sig Dispense Refill  . Alpha-Lipoic Acid 300 MG TABS Take 2 tablets by mouth daily.    Marland Kitchen aspirin EC 81 MG tablet Take 1 tablet (81 mg total) by mouth daily. 90 tablet 3  . atorvastatin (LIPITOR) 40 MG tablet TAKE 1 TABLET BY MOUTH DAILY AT 6 PM. 90 tablet 3  . B Complex-C (SUPER B COMPLEX/VITAMIN C) TABS 1 tablet by mouth daily    . [employer] D3 50 MCG (2000 UT) CAPS TAKE 1 CAPSULE BY MOUTH EVERY DAY 90 capsule 2  . dorzolamide-timolol (COSOPT) 22.3-6.8 MG/ML ophthalmic solution Place 1 drop into both eyes 2 (two) times  daily.    Marland Kitchen FARXIGA 5 MG TABS tablet TAKE 1 TABLET BY MOUTH EVERY DAY 90 tablet 3  . glucose blood (ONETOUCH VERIO) test strip Use as instructed to check blood sugar 2 times a day E11.42 200 strip 0  . Lancets (ONETOUCH DELICA PLUS LANCET30G) MISC USE TO TEST BLOOD SUGAR 3 TIMES DAILY AS INSTRUCTED. 100 each 4  . latanoprost (XALATAN) 0.005 % ophthalmic solution Place 1 drop into both eyes at bedtime.     Marland Kitchen losartan (COZAAR) 50 MG tablet Take 1 tablet (50 mg total) by mouth daily. 30 tablet 11  . metFORMIN (GLUCOPHAGE) 1000 MG tablet TAKE 1 TABLET BY MOUTH TWICE A DAY WITH A MEAL 180 tablet 3   No current facility-administered medications on file prior to visit.   Allergies  Allergen Reactions  . Penicillins     As a child   Family History  Problem Relation Age of Onset  . Diabetes Mother   . Hypertension Mother   . Diabetes Father   . Heart disease Father   . Diabetes Brother   . Diabetes Sister     PE: BP 128/82 (BP Location: Right Arm, Patient Position: Sitting, Cuff Size: Normal)   Pulse 62   Ht 6\' 5"  (1.956 m)   Wt 205 lb 6.4 oz (93.2 kg)   SpO2 95%   BMI 24.36 kg/m  Body mass index is 24.36 kg/m.  Wt Readings from Last 3 Encounters:  06/01/20 205 lb 6.4 oz (93.2 kg)  03/04/20 203 lb 3.2 oz (92.2 kg)  12/08/19 206 lb 3.2 oz  (93.5 kg)   Constitutional: overweight, in NAD Eyes: PERRLA, EOMI, no exophthalmos ENT: moist mucous membranes, no thyromegaly, no cervical lymphadenopathy Cardiovascular: RRR, No MRG Respiratory: CTA B Gastrointestinal: abdomen soft, NT, ND, BS+ Musculoskeletal: no deformities, strength intact in all 4 Skin: moist, warm, no rashes Neurological: no tremor with outstretched hands, DTR normal in all 4  ASSESSMENT: 1. DM2, insulin-independent, controlled, with complications  - PN -stable  2. PN 2/2 diabetes  3. HL  PLAN:  1. Patient with well-controlled diabetes, on oral medication regimen with metformin and low-dose SGLT2 inhibitor.  He is status post aortic aneurysm repair at [facility] clinic in 01/2018.  After the surgery, he lost 15 pounds.  He is blood sugars improved and at last visit HbA1c was lower, excellent.  Sugars were at goal or slightly higher in the morning and he was not checking sugars later in the day to understand the trends.  I advised him to occasionally check later in the day but we did not change his regimen. -At today's visit, sugars are well controlled.  He noticed that approximately 3 weeks ago, they were low to be higher, but they improved.  CBGs were not elevated during his mild COVID-19 episode. -For now, we will continue the current regimen.  I refilled his metformin and test strips. - I suggested to: Patient Instructions  Please  continue: - Metformin 1000 mg 2x a day with meals - Farxiga 5 mg daily before breakfast   Please return in 6 months with your sugar log.   - we checked his HbA1c: 5.9% (stable) - advised to check sugars at different times of the day - 1x a day, rotating check times - advised for yearly eye exams >> he is UTD - return to clinic in 6 months  2. PN 2/2 DM  -He has numbness in his feet, but no pain -He is on alpha-lipoic acid and  B complex, which are both helping  3. HL -Reviewed latest lipid panel from last year: LDL at  goal, HDL slightly low: Lab Results  Component Value Date   CHOL 113 07/09/2019   HDL 37.20 (L) 07/09/2019   LDLCALC 56 07/09/2019   LDLDIRECT 84.0 11/04/2018   TRIG 102.0 07/09/2019   CHOLHDL 3 07/09/2019  -Continues Lipitor 40 mg daily without side effects -He has an appointment coming up with PCP this summer.  [name], MD PhD [facility] Endocrinology

## 2020-06-01 NOTE — Patient Instructions (Signed)
Please  continue: - Metformin 1000 mg 2x a day with meals - Farxiga 5 mg daily before breakfast    Please return in 6 months with your sugar log.  

## 2020-06-04 IMAGING — CT CT HEART SCORING
2 series · 16 of 20 positions shown, 18 images · non-contrast
Comparison: None.
COMPARISON: None.

Addendum:
EXAM:
OVER-READ INTERPRETATION  CT CHEST

The following report is an over-read performed by radiologist Dr.
Jozoo Randelj [REDACTED] on 11/26/2018. This
over-read does not include interpretation of cardiac or coronary
anatomy or pathology. The coronary calcium score interpretation by
the cardiologist is attached.
CLINICAL DATA: Risk stratification
Coronary Calcium Score
TECHNIQUE: The patient was scanned on a Siemens Force scanner. Axial
non-contrast 3 mm slices were carried out through the heart. The
data set was analyzed on a dedicated work station and scored using
the Agatson method.

[Series 2: casc 3.0 i36f 2 bestdiast 67 % · axial · 0.39mm/px · z∈[-258,-138]mm · 8 of 52 slices shown, 10 images]
[im 6/52  vessel]
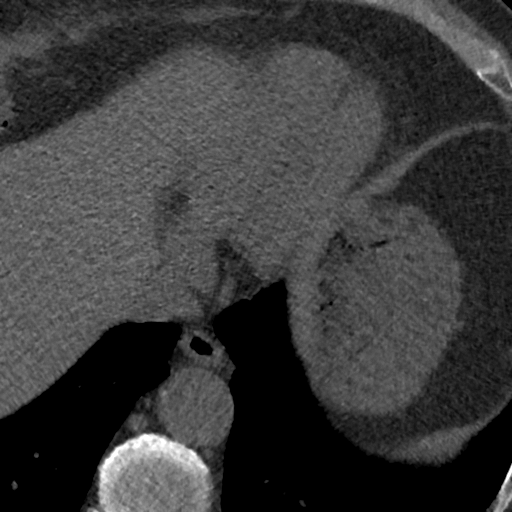
[im 6/52  lung]
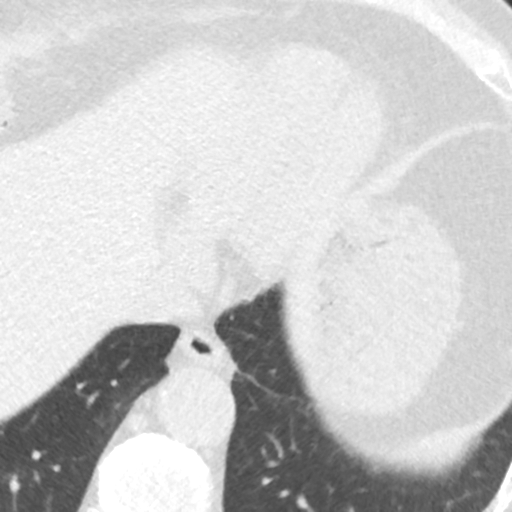
[im 12/52  vessel]
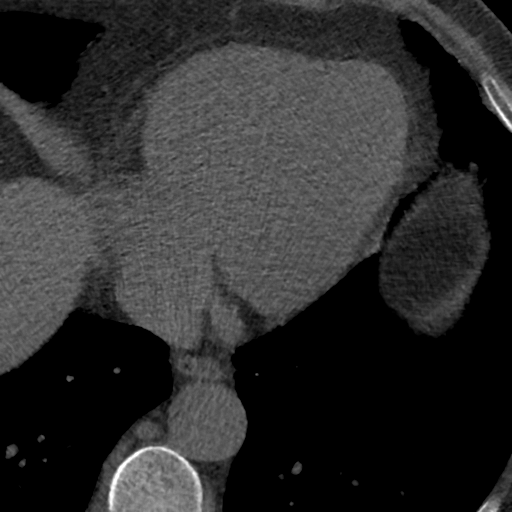
[im 18/52  vessel]
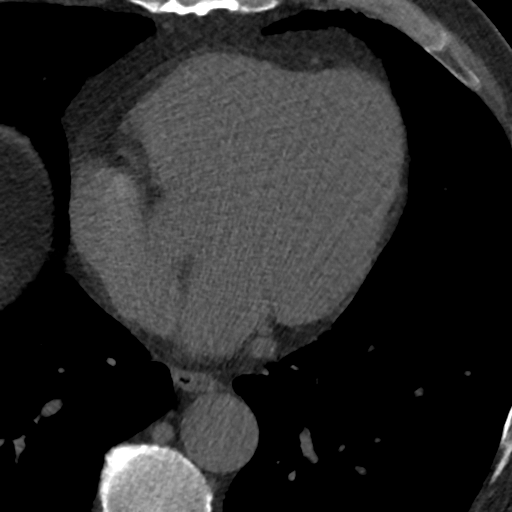
[im 23/52  vessel]
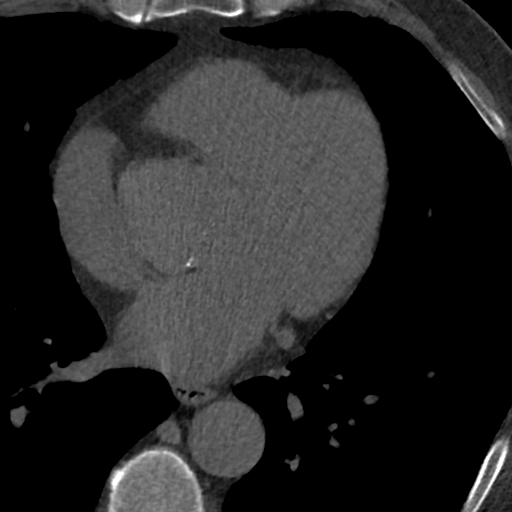
[im 29/52  vessel]
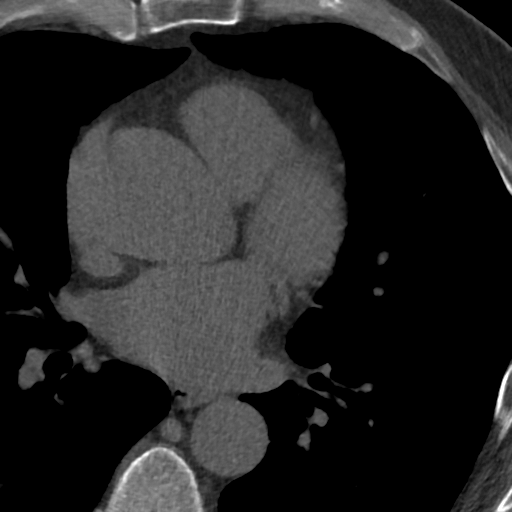
[im 29/52  lung]
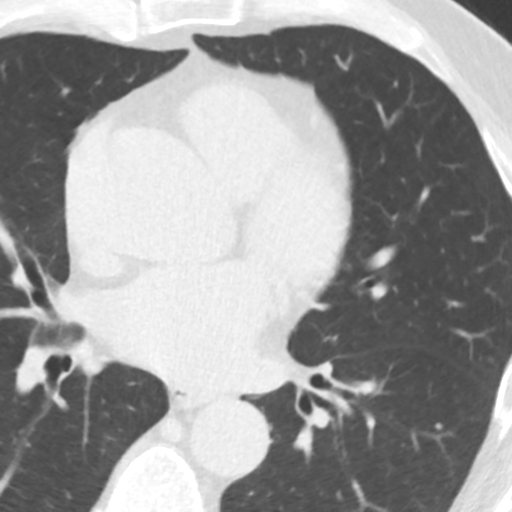
[im 35/52  vessel]
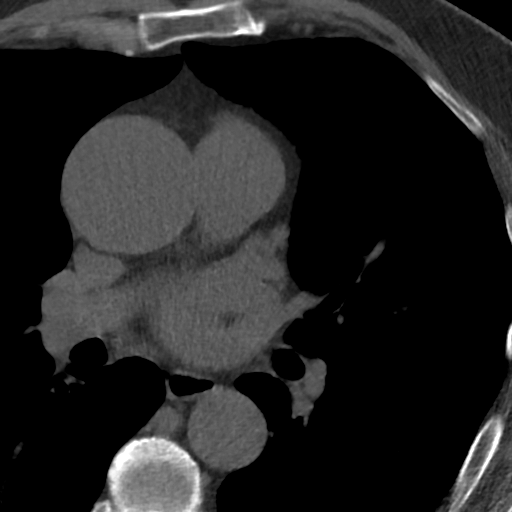
[im 40/52  vessel]
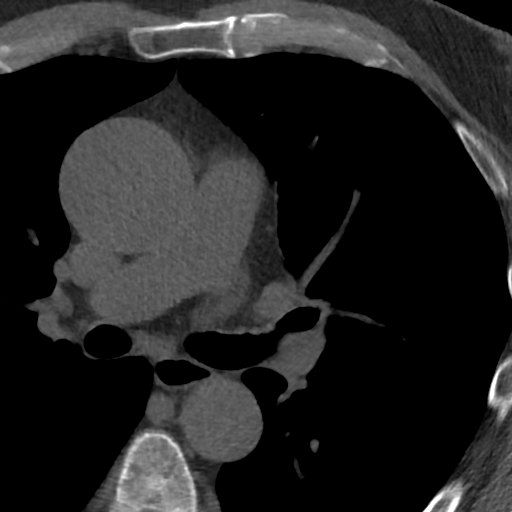
[im 46/52  vessel]
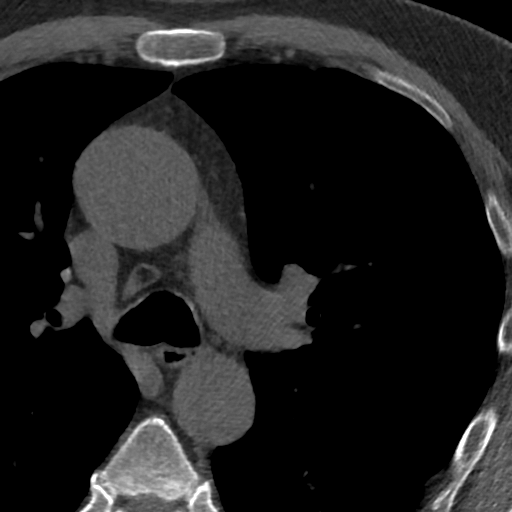

[Series 4: lung st 67 % · axial · 0.79mm/px · z∈[-258,-138]mm · 8 of 52 slices shown]
[im 6/52  lung]
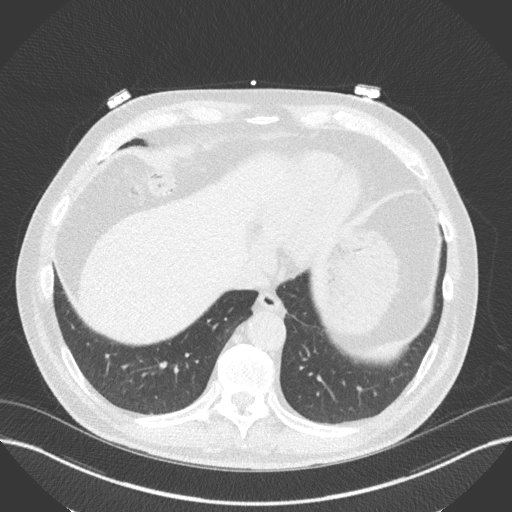
[im 12/52  lung]
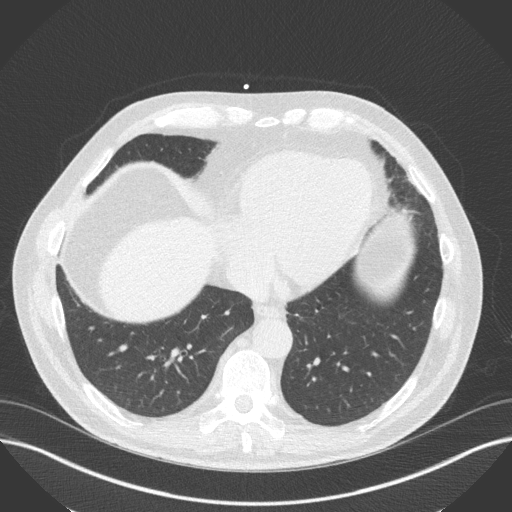
[im 18/52  lung]
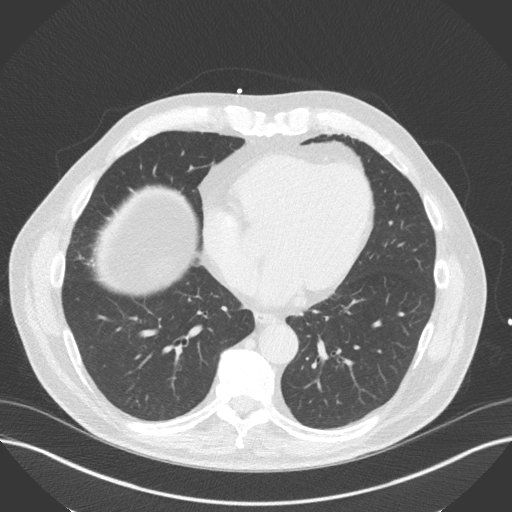
[im 23/52  lung]
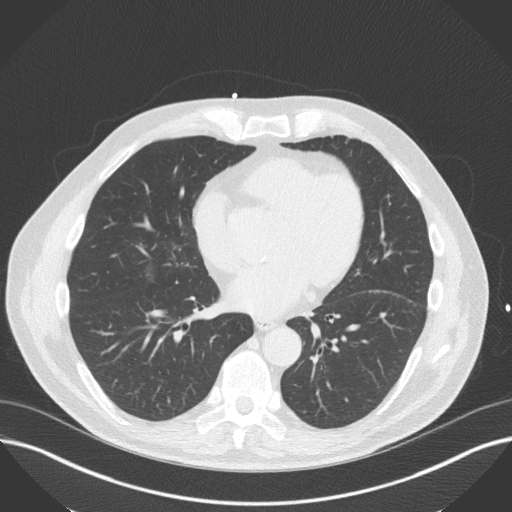
[im 29/52  lung]
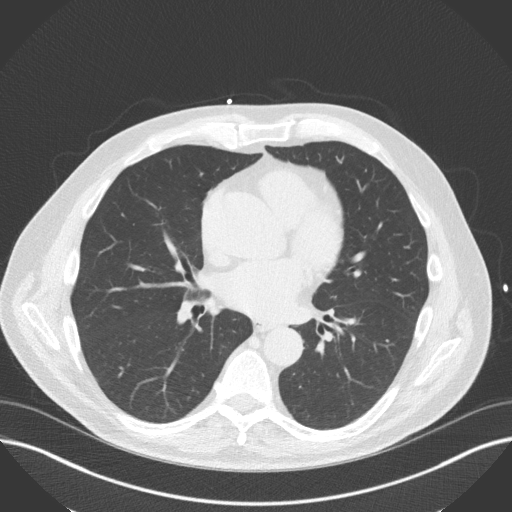
[im 35/52  lung]
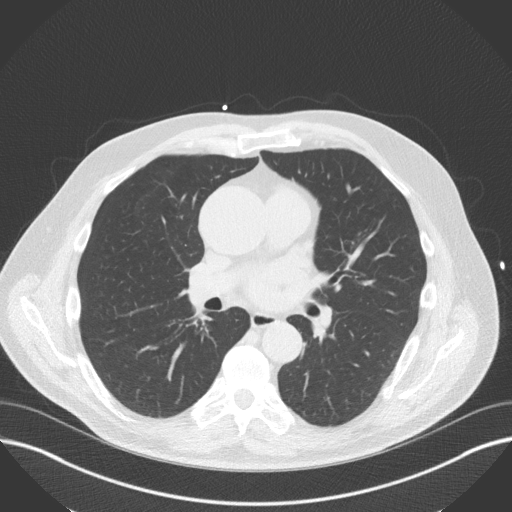
[im 40/52  lung]
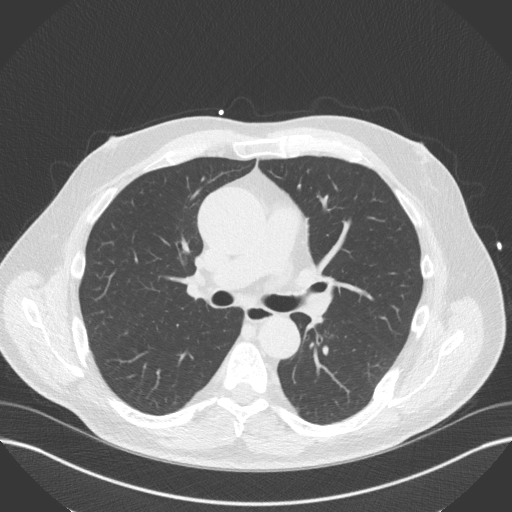
[im 46/52  lung]
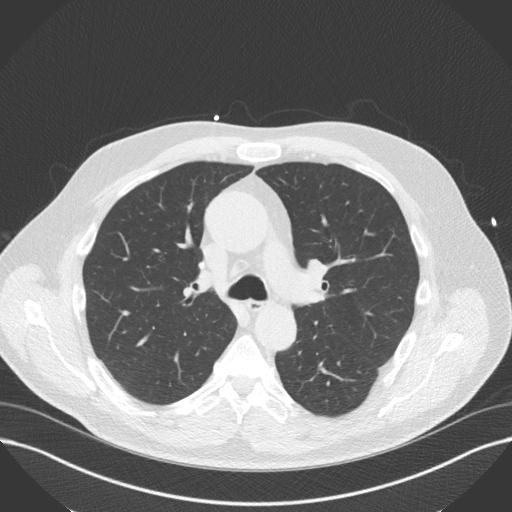

[16 of 20 positions shown; findings below may reference images not displayed]

FINDINGS: Vascular: There is significant dilatation of the ascending thoracic
aorta which measures up to 5.4 cm in greatest diameter. The aortic
valve appears to be partially calcified. The entire thoracic aorta
is not imaged on the current study.

Mediastinum/Nodes: Visualized mediastinum and hilar regions
demonstrate some small nonenlarged mediastinal lymph nodes.

Lungs/Pleura: Visualized lungs show no evidence of pulmonary edema,
consolidation, pneumothorax, nodule or pleural fluid.

Upper Abdomen: No acute abnormality.

Musculoskeletal: No chest wall mass or suspicious bone lesions
identified.
IMPRESSION: Significant dilatation of the ascending thoracic aorta which
measures up to 5.4 cm in greatest diameter. The aortic valve appears
to be partially calcified. Recommend formal evaluation of the entire
thoracic aorta with CTA of the chest with contrast and referral to
thoracic surgery.
FINDINGS: Non-cardiac: See separate report from [REDACTED].

Ascending Aorta: Severely dilated ascending aorta with maximum
diameter 5.4cm. No calcifications.

Pericardium: Normal

Coronary arteries: Normal coronary origins. Coronary calcium in the
LAD.
IMPRESSION: 1. Coronary calcium score of 11. This was 31st percentile for age
and sex matched control.

2.   Severe ascending aortic aneurysm.

3.   Recommend Cardiology Consult

Anna Dariusz Ingielewicz

*** End of Addendum ***
EXAM:
OVER-READ INTERPRETATION  CT CHEST

The following report is an over-read performed by radiologist Dr.
Jozoo Randelj [REDACTED] on 11/26/2018. This
over-read does not include interpretation of cardiac or coronary
anatomy or pathology. The coronary calcium score interpretation by
the cardiologist is attached.
FINDINGS: Vascular: There is significant dilatation of the ascending thoracic
aorta which measures up to 5.4 cm in greatest diameter. The aortic
valve appears to be partially calcified. The entire thoracic aorta
is not imaged on the current study.

Mediastinum/Nodes: Visualized mediastinum and hilar regions
demonstrate some small nonenlarged mediastinal lymph nodes.

Lungs/Pleura: Visualized lungs show no evidence of pulmonary edema,
consolidation, pneumothorax, nodule or pleural fluid.

Upper Abdomen: No acute abnormality.

Musculoskeletal: No chest wall mass or suspicious bone lesions
identified.
IMPRESSION: Significant dilatation of the ascending thoracic aorta which
measures up to 5.4 cm in greatest diameter. The aortic valve appears
to be partially calcified. Recommend formal evaluation of the entire
thoracic aorta with CTA of the chest with contrast and referral to
thoracic surgery.

## 2020-06-28 ENCOUNTER — Other Ambulatory Visit: Payer: Self-pay | Admitting: Internal Medicine

## 2020-08-02 IMAGING — CR DG CHEST 2V
2 series · 2 of 2 positions shown · non-contrast
Comparison: 12/09/2018

CLINICAL DATA: Recent heart surgery 2 weeks ago, initial encounter

EXAM:
CHEST - 2 VIEW

[chest pa]
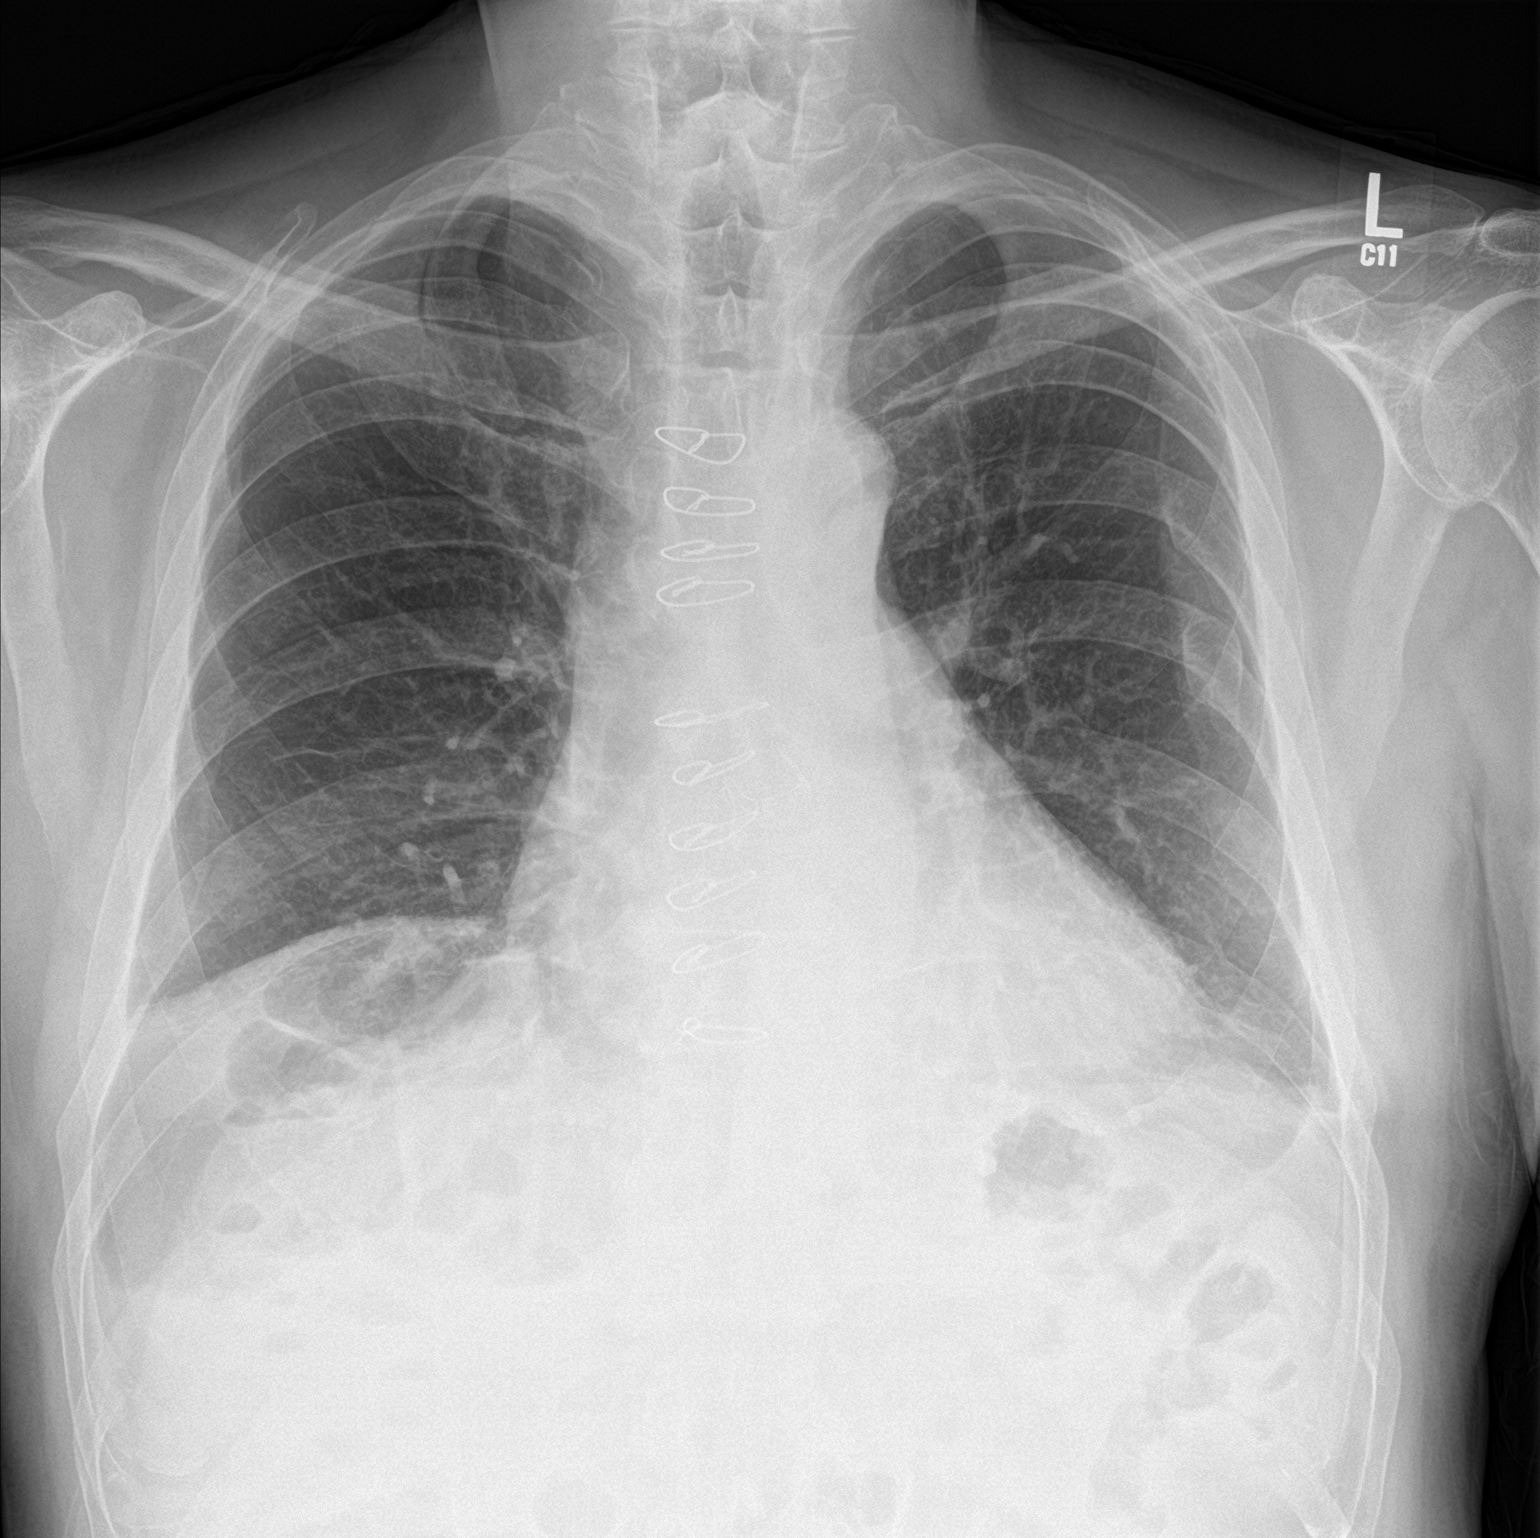

[chest lat]
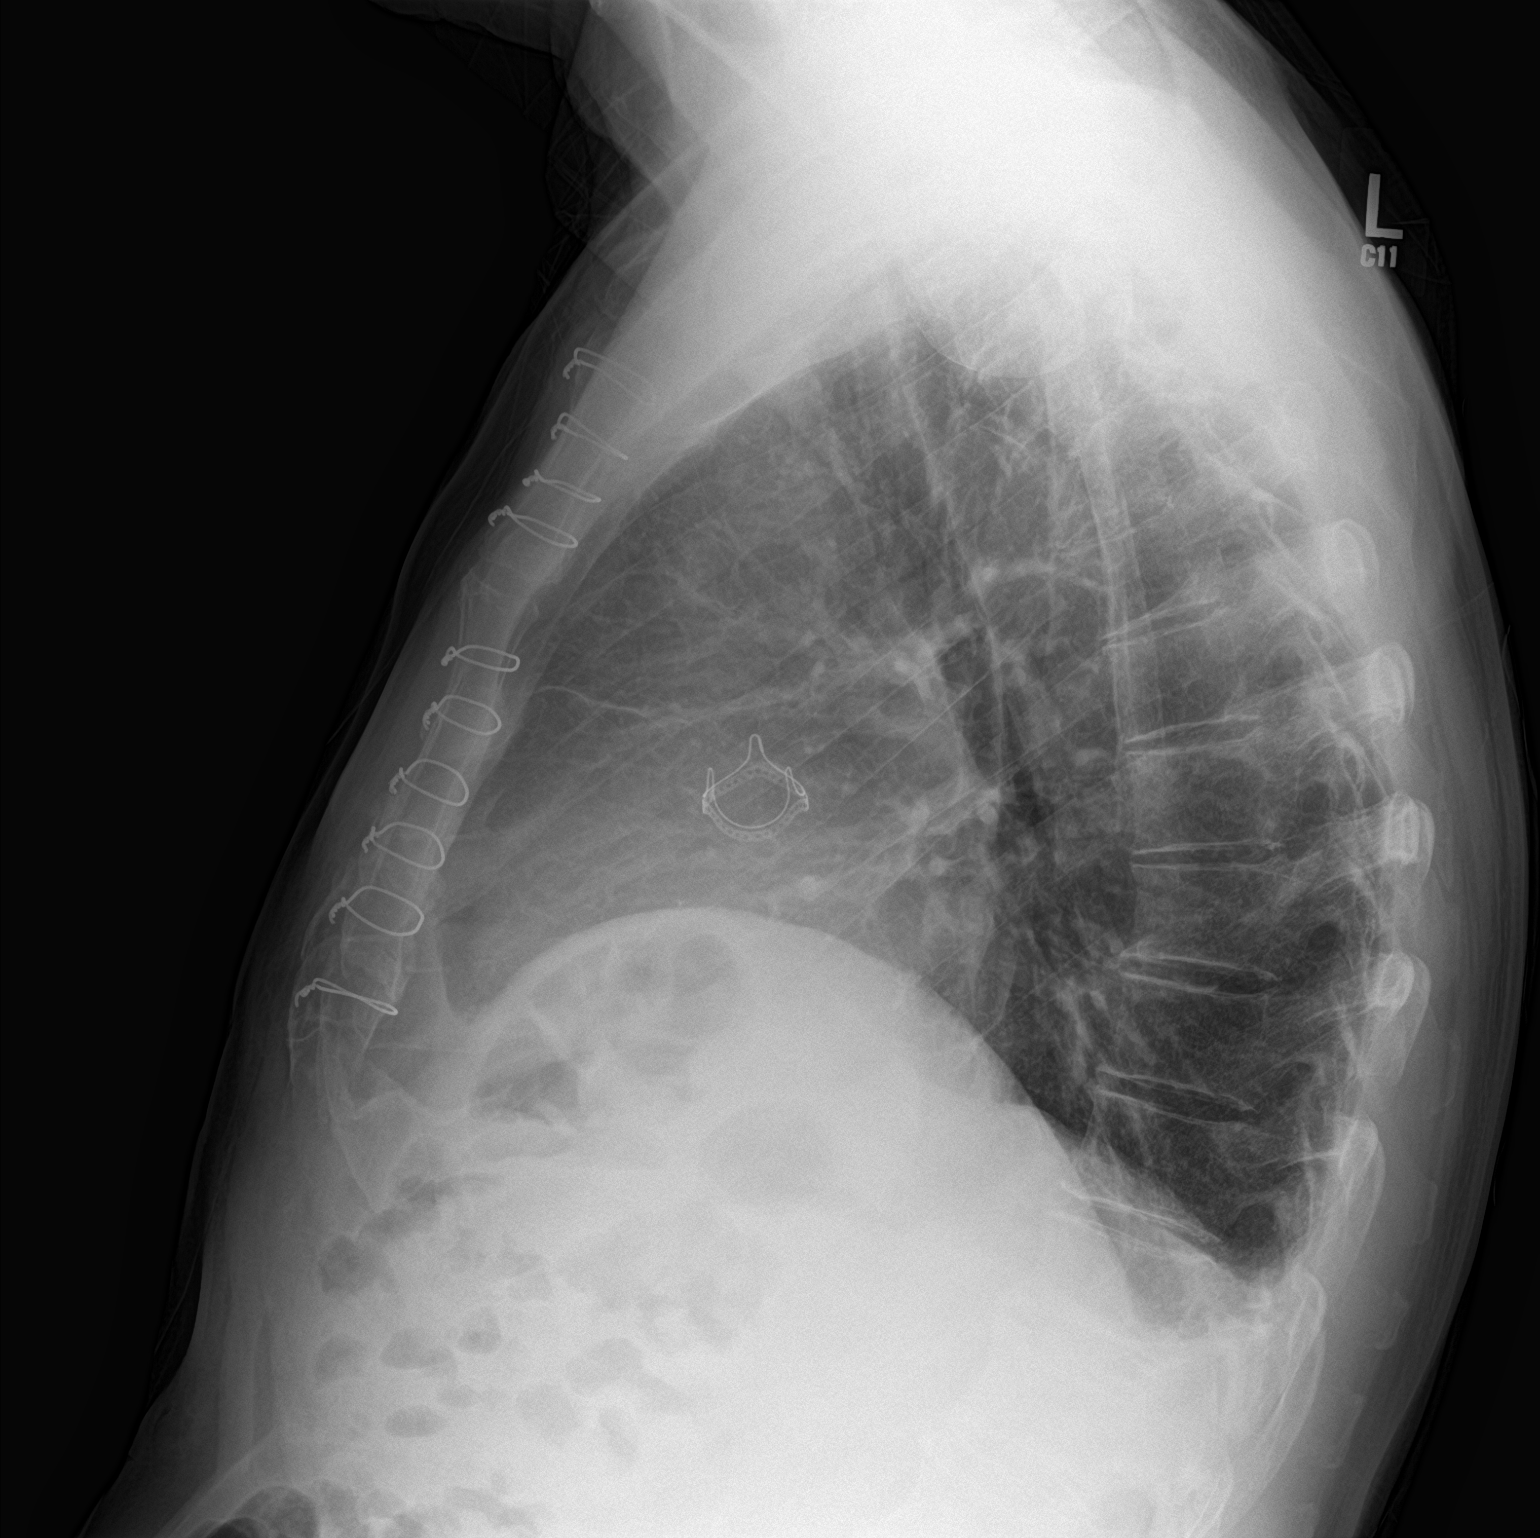

[2 of 2 positions shown; findings below may reference images not displayed]

FINDINGS: Cardiac shadow is mildly enlarged. Postsurgical changes are seen.
The lungs are well aerated bilaterally. Minimal posterior blunting
of the costophrenic angles is noted. No sizable infiltrate is seen.
Old rib fractures on the left are noted.
IMPRESSION: Postsurgical changes. Minimal pleural effusions are noted
posteriorly

## 2020-08-09 ENCOUNTER — Other Ambulatory Visit: Payer: Self-pay | Admitting: Internal Medicine

## 2020-08-16 ENCOUNTER — Encounter: Payer: Self-pay | Admitting: Internal Medicine

## 2020-08-18 ENCOUNTER — Other Ambulatory Visit: Payer: Self-pay | Admitting: Internal Medicine

## 2020-08-18 MED ORDER — DAPAGLIFLOZIN PROPANEDIOL 10 MG PO TABS: 10.0000 mg | ORAL_TABLET | Freq: Every day | ORAL | 3 refills | Status: DC

## 2020-08-19 ENCOUNTER — Other Ambulatory Visit: Payer: Self-pay | Admitting: Internal Medicine

## 2020-08-19 MED ORDER — DAPAGLIFLOZIN PROPANEDIOL 5 MG PO TABS: 5.0000 mg | ORAL_TABLET | Freq: Every day | ORAL | 3 refills | Status: DC

## 2020-08-31 ENCOUNTER — Other Ambulatory Visit: Payer: Self-pay | Admitting: Internal Medicine

## 2020-08-31 DIAGNOSIS — E1142 Type 2 diabetes mellitus with diabetic polyneuropathy: Secondary | ICD-10-CM

## 2020-10-15 ENCOUNTER — Ambulatory Visit (HOSPITAL_COMMUNITY)
Admission: RE | Admit: 2020-10-15 | Discharge: 2020-10-15 | Disposition: A | Discharge status: Home / Self Care | Payer: Medicare Other | Source: Ambulatory Visit | Attending: Internal Medicine | Admitting: Internal Medicine

## 2020-10-15 ENCOUNTER — Other Ambulatory Visit: Payer: Self-pay

## 2020-10-15 VITALS — BP 126/78 | HR 60 | Ht 77.0 in | Wt 209.8 lb

## 2020-10-15 DIAGNOSIS — Z7984 Long term (current) use of oral hypoglycemic drugs: Secondary | ICD-10-CM | POA: Diagnosis not present

## 2020-10-15 DIAGNOSIS — Z79899 Other long term (current) drug therapy: Secondary | ICD-10-CM | POA: Diagnosis not present

## 2020-10-15 DIAGNOSIS — I110 Hypertensive heart disease with heart failure: Secondary | ICD-10-CM | POA: Diagnosis not present

## 2020-10-15 DIAGNOSIS — Z7982 Long term (current) use of aspirin: Secondary | ICD-10-CM | POA: Diagnosis not present

## 2020-10-15 DIAGNOSIS — Q231 Congenital insufficiency of aortic valve: Secondary | ICD-10-CM | POA: Diagnosis present

## 2020-10-15 DIAGNOSIS — Z880 Allergy status to penicillin: Secondary | ICD-10-CM | POA: Insufficient documentation

## 2020-10-15 DIAGNOSIS — Z8249 Family history of ischemic heart disease and other diseases of the circulatory system: Secondary | ICD-10-CM | POA: Diagnosis not present

## 2020-10-15 DIAGNOSIS — I2510 Atherosclerotic heart disease of native coronary artery without angina pectoris: Secondary | ICD-10-CM | POA: Insufficient documentation

## 2020-10-15 DIAGNOSIS — Z833 Family history of diabetes mellitus: Secondary | ICD-10-CM | POA: Insufficient documentation

## 2020-10-15 DIAGNOSIS — Z953 Presence of xenogenic heart valve: Secondary | ICD-10-CM | POA: Insufficient documentation

## 2020-10-15 DIAGNOSIS — I509 Heart failure, unspecified: Secondary | ICD-10-CM | POA: Insufficient documentation

## 2020-10-15 DIAGNOSIS — E119 Type 2 diabetes mellitus without complications: Secondary | ICD-10-CM | POA: Insufficient documentation

## 2020-10-15 DIAGNOSIS — Z952 Presence of prosthetic heart valve: Secondary | ICD-10-CM

## 2020-10-15 DIAGNOSIS — I10 Essential (primary) hypertension: Secondary | ICD-10-CM

## 2020-10-15 NOTE — Addendum Note (Signed)
Encounter addended by: Chinita Pester, CMA on: 10/15/2020 11:03 AM  Actions taken: Order list changed, Diagnosis association updated

## 2020-10-15 NOTE — Addendum Note (Signed)
Encounter addended by: Chinita Pester, CMA on: 10/15/2020 10:55 AM  Actions taken: Clinical Note Signed

## 2020-10-15 NOTE — Patient Instructions (Signed)
No Labs done today.   No medication changes were made. Please continue all current medications as prescribed.  Your physician recommends that you schedule a follow-up appointment in: 6 months with an echo prior to your exam. Please contact our office in March 2023 to schedule a April 2023 appointment.   If you have any questions or concerns before your next appointment please send [place] a message through [place] or call our office at [phone].    TO LEAVE A MESSAGE FOR THE NURSE SELECT OPTION 2, PLEASE LEAVE A MESSAGE INCLUDING: YOUR NAME DATE OF BIRTH CALL BACK NUMBER REASON FOR CALL**this is important as we prioritize the call backs  YOU WILL RECEIVE A CALL BACK THE SAME DAY AS LONG AS YOU CALL BEFORE 4:00 PM   Do the following things EVERYDAY: Weigh yourself in the morning before breakfast. Write it down and keep it in a log. Take your medicines as prescribed Eat low salt foods--Limit salt (sodium) to 2000 mg per day.  Stay as active as you can everyday Limit all fluids for the day to less than 2 liters   At the [facility], you and your health needs are our priority. As part of our continuing mission to provide you with exceptional heart care, we have created designated Provider Care Teams. These Care Teams include your primary Cardiologist (physician) and Advanced Practice Providers (APPs- Physician Assistants and Nurse Practitioners) who all work together to provide you with the care you need, when you need it.   You may see any of the following providers on your designated Care Team at your next follow up: Dr Arvilla Meres Dr Carron Curie, NP Robbie Lis, [place] Karle Plumber, PharmD   Please be sure to bring in all your medications bottles to every appointment.

## 2020-10-15 NOTE — Progress Notes (Signed)
[facility] Note    PCP: Plotnikov, Georgina Quint, MD   HPI:  Clifford Alvarez is a 65 y/o male with h/o HTN, DM2 with bicuspid aortic valve with dilated aortic root. Underwent Bentall aortic root replacement with bioprosthetic AVR on 01/10/19 at the [facility].  Pre-op cardiac CTA. No significant CAD Calcium score 11  Post-op course relatively uncomplicated. Had moderate pericardial effusion on post-op echo and started on colchicine. Resolved on f/u echo.   Here for routine follow-up. Doing great. Walking 2.5-3.0 miles per day at a good pace. No CP or SOB.   Echo 03/04/20 EF 60-65. Normal diastolic function. RV ok. Bioprosthetic AVR ok mean gradient . Personally reviewed   Past Medical History:  Diagnosis Date   Ascending aortic aneurysm (HCC)    5.4cm ascending aorta by Chest CT 12/2018, s/p thoracic aortic aneurysm repair with Biobentall 11mm Perimount Valve, 75mm graft   Bicuspid aortic valve    no AS or AI on echo 12/2018   CAD (coronary artery disease), native coronary artery    minimal CAD of the pLAD and diagonal with 0-24% stenosis   CHF (congestive heart failure) (HCC)    Coronary artery calcification seen on CAT scan    calcium score 11 12/2018   Diabetes mellitus without complication (HCC)    Glaucoma    Heart murmur    History of childhood heart murmur (Aortic)    Current Outpatient Medications  Medication Sig Dispense Refill   Alpha-Lipoic Acid 300 MG TABS Take 2 tablets by mouth in the morning and at bedtime.     aspirin EC 81 MG tablet Take 1 tablet (81 mg total) by mouth daily. 90 tablet 3   atorvastatin (LIPITOR) 40 MG tablet TAKE 1 TABLET BY MOUTH DAILY AT 6 PM. 90 tablet 3   B Complex-C (SUPER B COMPLEX/VITAMIN C) TABS 1 tablet by mouth daily     Cholecalciferol (VITAMIN D3) 50 MCG (2000 UT) capsule TAKE 1 CAPSULE EVERY DAY 90 capsule 1   clindamycin (CLEOCIN) 300 MG capsule Take 300 mg by mouth every 6 (six) hours. For dental procedures      dapagliflozin propanediol (FARXIGA) 5 MG TABS tablet Take 1 tablet (5 mg total) by mouth daily. 90 tablet 3   dorzolamide-timolol (COSOPT) 22.3-6.8 MG/ML ophthalmic solution Place 1 drop into both eyes 2 (two) times daily.     Lancets (ONETOUCH DELICA PLUS LANCET30G) MISC USE TO TEST BLOOD SUGAR 3 TIMES DAILY AS INSTRUCTED. 100 each 4   latanoprost (XALATAN) 0.005 % ophthalmic solution Place 1 drop into both eyes at bedtime.      latanoprost (XALATAN) 0.005 % ophthalmic solution Place 1 drop into both eyes every evening.     losartan (COZAAR) 25 MG tablet Take 25 mg by mouth daily.     metFORMIN (GLUCOPHAGE) 1000 MG tablet TAKE 1 TABLET BY MOUTH TWICE A DAY WITH A MEAL 180 tablet 3   ONETOUCH VERIO test strip USE AS INSTRUCTED TO CHECK BLOOD SUGAR 2 TIMES A DAY E11.42 100 strip 1   losartan (COZAAR) 50 MG tablet Take 1 tablet (50 mg total) by mouth daily. (Patient not taking: Reported on 10/15/2020) 30 tablet 11   No current facility-administered medications for this encounter.    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  Tobacco Use   Smoking status: Never   Smokeless tobacco: Never  Substance and Sexual Activity   Alcohol use: No   Drug use: No   Sexual activity: Not on file  Other Topics Concern   Not on file  Social History Narrative   Married 38 years   Wife - Eunice Blase   Son 85   Daughter 2   Social Determinants of Corporate investment banker Strain: Not on [employer] Insecurity: Not on file  Transportation Needs: Not on file  Physical Activity: Not on file  Stress: Not on file  Social Connections: Not on file  Intimate Partner Violence: Not on file      Family  History  Problem Relation Age of Onset   Diabetes Mother    Hypertension Mother    Diabetes Father    Heart disease Father    Diabetes Brother    Diabetes Sister     Vitals:   10/15/20 1020  BP: 126/78  Pulse: 60  SpO2: 98%  Weight: 95.2 kg (209 lb 12.8 oz)  Height: 6\' 5"  (1.956 m)     PHYSICAL EXAM: General:  Well appearing. No resp difficulty HEENT: normal Neck: supple. no JVD. Carotids 2+ bilat; no bruits. No lymphadenopathy or thryomegaly appreciated. Cor: PMI nondisplaced. Regular rate & rhythm. No rubs, gallops or murmurs. Lungs: clear Abdomen: soft, nontender, nondistended. No hepatosplenomegaly. No bruits or masses. Good bowel sounds. Extremities: no cyanosis, clubbing, rash, edema Neuro: alert & orientedx3, cranial nerves grossly intact. moves all 4 extremities w/o difficulty. Affect pleasant   ASSESSMENT & PLAN:  1. Bicuspid aortic valve with dilated aortic root.  - s/p Bentall aortic root replacement with bioprosthetic AVR on 01/10/19 at the [facility] - Echo 03/04/20 EF 60-65. Normal diastolic function. RV ok. Bioprosthetic AVR ok mean gradient 05/04/20.  - Doing great.  - Off b-blocker due to bradycardia - Aware of SBE prophylaxis - continue ASA 81 - RTC in 6 months with Echo.   2. HTN - Blood pressure well controlled. Continue current regimen  3. Type 2 DM - controlled HgBA1c 5.8 followed by Dr. - On Farxiga and atorva   01-06-2002, MD 10/15/20

## 2020-11-16 ENCOUNTER — Telehealth: Payer: Self-pay

## 2020-11-16 NOTE — Telephone Encounter (Signed)
Inbound fax requesting recent clinical notes. Clinical notes routed via Epic to [phone]

## 2020-12-13 ENCOUNTER — Other Ambulatory Visit: Payer: Self-pay

## 2020-12-13 ENCOUNTER — Ambulatory Visit (INDEPENDENT_AMBULATORY_CARE_PROVIDER_SITE_OTHER): Payer: Medicare Other | Admitting: Internal Medicine

## 2020-12-13 ENCOUNTER — Encounter: Payer: Self-pay | Admitting: Internal Medicine

## 2020-12-13 VITALS — BP 118/70 | HR 56 | Ht 77.0 in | Wt 207.0 lb

## 2020-12-13 DIAGNOSIS — I2510 Atherosclerotic heart disease of native coronary artery without angina pectoris: Secondary | ICD-10-CM

## 2020-12-13 DIAGNOSIS — G63 Polyneuropathy in diseases classified elsewhere: Secondary | ICD-10-CM

## 2020-12-13 DIAGNOSIS — E7800 Pure hypercholesterolemia, unspecified: Secondary | ICD-10-CM | POA: Diagnosis not present

## 2020-12-13 DIAGNOSIS — E1142 Type 2 diabetes mellitus with diabetic polyneuropathy: Secondary | ICD-10-CM

## 2020-12-13 LAB — POCT GLYCOSYLATED HEMOGLOBIN (HGB A1C): Hemoglobin A1C: 6 % — AB (ref 4.0–5.6)

## 2020-12-13 NOTE — Patient Instructions (Signed)
Please  continue: - Metformin 1000 mg 2x a day with meals - Farxiga 5 mg daily before breakfast    Please return in 6 months with your sugar log.

## 2020-12-13 NOTE — Progress Notes (Signed)
Patient ID: Clifford Alvarez, male   DOB: 1955-06-19, 65 y.o.   MRN: [id number]  This visit occurred during the SARS-CoV-2 public health emergency.  Safety protocols were in place, including screening questions prior to the visit, additional usage of staff PPE, and extensive cleaning of exam room while observing appropriate contact time as indicated for disinfecting solutions.   HPI: Clifford Alvarez is a 65 y.o.-year-old male, returning for f/u for DM2 dx 01/28/2013, previously insulin-dependent, now off insulin, controlled, with complications (Peripheral neuropathy). Last visit was 6.5 months ago.  Interim history: No increased urination, nausea, chest pain. He has blurry vision. He will have cataract surgery soon.  Reviewed HbA1c levels: Lab Results  Component Value Date   HGBA1C 5.9 (A) 06/01/2020   HGBA1C 5.9 (A) 12/08/2019   HGBA1C 6.4 07/09/2019   HGBA1C 6.4 (A) 06/30/2019   HGBA1C 6.0 (A) 02/12/2019   HGBA1C 6.0 (A) 11/11/2018   HGBA1C 6.1 (A) 05/20/2018   HGBA1C 5.8 (A) 11/19/2017   HGBA1C 6.0 05/14/2017   HGBA1C 6 01/08/2017   HGBA1C 6.3 10/26/2016   HGBA1C 5.7 08/07/2016   HGBA1C 6.0 04/17/2016   HGBA1C 6.0 12/13/2015   HGBA1C 5.9 08/12/2015   HGBA1C 5.9 04/13/2015   HGBA1C 5.8 12/15/2014   HGBA1C 5.9 08/17/2014   HGBA1C 6.1 05/11/2014   HGBA1C 6.3 02/11/2014   He is on: - Metformin 1000 mg 2x a day with meals - Jardiance 25 mg in am >> Farxiga 10 mg in a.m. >> Invokana 100,  then 300 mg daily  >> Farxiga 5 mg daily - 02/2019. He was previously on Basaglar 12 units at bedtime >> stopped 08/2016 >> restarted: 6-8 units 3x a day >> now off completely.  Pt checks his sugars once a day: - am: 110-140s >> 110-140s >> 97-146 (most 120-135) - 2h after b'fast: 146 >> 197 >> n/c  - before lunch:  102, 128 >> n/c - 2h after lunch: 1n/c >> 126 >> n/c - before dinner: n/c >> n/c >> 124-142 >> n/c - 2h after dinner:  110-140s >> 130-140 >> n/c - bedtime:  120-174 >>  n/c - nighttime:  123-145, 170 >> n/c Lowest sugar was 115 >> 110 >> 100s >> 97.  He  has hypoglycemia awareness in the 70s. Highest sugar was 160s >> 170 (Txgiving) >> <170 >> 146.  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  Pt's meals are: - Breakfast: bagel, oatmeal, granola, eggs - Lunch: meat + 2-3 vegetables; soup; salad; sandwich - Dinner: meat + 2-3 vegetables; pasta; [place]; seafood - Snacks: 1 a day >> nuts, popcorn, dark chocolate  -No CKD, last BUN/creatinine:  Lab Results  Component Value Date   BUN 13 11/05/2019   CREATININE 1.02 11/05/2019   Lab Results  Component Value Date   GFRNONAA >60 11/05/2019   GFRNONAA >60 01/24/2019   GFRNONAA >89 04/13/2015   No available: Lab Results  Component Value Date   MICRALBCREAT 0.5 07/09/2019   MICRALBCREAT 0.6 11/04/2018   MICRALBCREAT 1.6 10/29/2017   MICRALBCREAT 0.8 10/26/2016   MICRALBCREAT 0.6 04/13/2015   MICRALBCREAT 0.7 05/11/2014   MICRALBCREAT 0.3 03/12/2013  On losartan.  -+ HL; reviewed latest lipid panel: Lab Results  Component Value Date   CHOL 113 07/09/2019   HDL 37.20 (L) 07/09/2019   LDLCALC 56 07/09/2019   LDLDIRECT 84.0 11/04/2018   TRIG 102.0 07/09/2019   CHOLHDL 3 07/09/2019  On Lipitor 40.  - last eye exam was on 12/01/2020 at [facility]  [place]: + Primary open-angle glaucoma OU ; no DR, + cataracts OU. Dr. Lottie Dawson.  He is seen every 4 mo - has frequent VF tests.  He will have cataract surgeries.  -He has numbness but no tingling in his feet.  ROS: + see HPI Neurological: no tremors/+ numbness/no tingling/no dizziness  I reviewed pt's medications, allergies, PMH, social hx, family hx, and changes were documented in the history of present illness. Otherwise, unchanged from my initial visit note.  Past Medical History:  Diagnosis Date   Ascending aortic aneurysm (HCC)    5.4cm ascending aorta by Chest CT 12/2018, s/p thoracic aortic aneurysm repair with Biobentall 15mm Perimount Valve, 75mm  graft   Bicuspid aortic valve    no AS or AI on echo 12/2018   CAD (coronary artery disease), native coronary artery    minimal CAD of the pLAD and diagonal with 0-24% stenosis   CHF (congestive heart failure) (HCC)    Coronary artery calcification seen on CAT scan    calcium score 11 12/2018   Diabetes mellitus without complication (HCC)    Glaucoma    Heart murmur    History of childhood heart murmur (Aortic)   Past Surgical History:  Procedure Laterality Date   BENTALL PROCEDURE  01/10/2019   at [facility]   COLONOSCOPY  2006   Dr. 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  Tobacco Use   Smoking status: Never   Smokeless tobacco: Never  Substance and Sexual Activity   Alcohol use: No   Drug use: No   Sexual activity: Not on file  Other Topics Concern   Not on file  Social History Narrative   Married 38 years   Wife - Eunice Blase   Son 28   Daughter 67   Social Determinants of Corporate investment banker Strain: Not on Ship broker Insecurity: Not on file  Transportation Needs: Not on file  Physical Activity: Not on file  Stress: Not on file  Social Connections: Not on file  Intimate Partner Violence: Not on file   Current Outpatient Medications on File Prior to Visit  Medication Sig Dispense Refill   Alpha-Lipoic Acid 300 MG TABS Take 2 tablets by mouth in the morning and at bedtime.     aspirin EC 81 MG tablet Take 1 tablet (81 mg total) by mouth daily. 90 tablet 3   atorvastatin (LIPITOR) 40 MG tablet TAKE 1 TABLET BY MOUTH DAILY AT 6 PM. 90 tablet 3   B Complex-C (SUPER B COMPLEX/VITAMIN C) TABS 1 tablet by mouth daily     Cholecalciferol (VITAMIN D3) 50 MCG (2000 UT) capsule TAKE 1 CAPSULE EVERY DAY 90 capsule 1    clindamycin (CLEOCIN) 300 MG capsule Take 300 mg by mouth every 6 (six) hours. For dental procedures     dapagliflozin propanediol (FARXIGA) 5 MG TABS tablet Take 1 tablet (5 mg total) by mouth daily. 90 tablet 3   dorzolamide-timolol (COSOPT) 22.3-6.8 MG/ML ophthalmic solution Place 1 drop into both eyes 2 (two) times daily.     Lancets (ONETOUCH DELICA PLUS LANCET30G) MISC USE TO TEST BLOOD SUGAR 3 TIMES DAILY AS INSTRUCTED. 100 each 4   latanoprost (XALATAN)  0.005 % ophthalmic solution Place 1 drop into both eyes at bedtime.      latanoprost (XALATAN) 0.005 % ophthalmic solution Place 1 drop into both eyes every evening.     losartan (COZAAR) 25 MG tablet Take 25 mg by mouth daily.     losartan (COZAAR) 50 MG tablet Take 1 tablet (50 mg total) by mouth daily. (Patient not taking: Reported on 10/15/2020) 30 tablet 11   metFORMIN (GLUCOPHAGE) 1000 MG tablet TAKE 1 TABLET BY MOUTH TWICE A DAY WITH A MEAL 180 tablet 3   ONETOUCH VERIO test strip USE AS INSTRUCTED TO CHECK BLOOD SUGAR 2 TIMES A DAY E11.42 100 strip 1   No current facility-administered medications on file prior to visit.   Allergies  Allergen Reactions   Penicillins     As a child   Family History  Problem Relation Age of Onset   Diabetes Mother    Hypertension Mother    Diabetes Father    Heart disease Father    Diabetes Brother    Diabetes Sister     PE: There were no vitals taken for this visit. There is no height or weight on file to calculate BMI.  Wt Readings from Last 3 Encounters:  10/15/20 209 lb 12.8 oz (95.2 kg)  06/01/20 205 lb 6.4 oz (93.2 kg)  03/04/20 203 lb 3.2 oz (92.2 kg)   Constitutional: Slightly overweight, in NAD Eyes: PERRLA, EOMI, no exophthalmos ENT: moist mucous membranes, no thyromegaly, no cervical lymphadenopathy Cardiovascular: RRR, No MRG Respiratory: CTA B Musculoskeletal: no deformities, strength intact in all 4 Skin: moist, warm, no rashes Neurological: no tremor with  outstretched hands, DTR normal in all 4  ASSESSMENT: 1. DM2, insulin-independent, controlled, with complications  - PN -stable  2. PN 2/2 diabetes  3. HL  PLAN:  1. Patient with well-controlled diabetes, on oral antidiabetic regimen with metformin and low-dose SGLT2 inhibitor.  He is status post aortic aneurysm repair at [facility] clinic in 01/2018.  After surgery, he lost 15 pounds.  His blood sugars improved and HbA1c levels decreased to 5.9%.  At last visit, sugars were well controlled, despite having had COVID-19.  We did not change his regimen. -At today's visit, he is still only checking sugars in the morning and they are mostly at goal with very few exceptions.  He is not checking sugars later in the day and I strongly advised him to start doing so.  Otherwise, we can continue the same regimen (he cuts Farxiga 10 mg in half and takes 5 mg daily and he states in approximately $400 every 3 months). - I suggested to: Patient Instructions  Please  continue: - Metformin 1000 mg 2x a day with meals - Farxiga 5 mg daily before breakfast    Please return in 6 months with your sugar log.   - we checked his HbA1c: 6.0% (slightly higher than before, but still excellent) - advised to check sugars once a day but I advised him to rotate check times - advised for yearly eye exams >> he is UTD - return to clinic in 6 months  2. PN 2/2 DM  -He has numbness in his feet, but no pain -He continues on alpha-lipoic acid and B complex, which are both helping  3. HL -Reviewed latest lipid panel from 2021: LDL at goal, HDL slightly low Lab Results  Component Value Date   CHOL 113 07/09/2019   HDL 37.20 (L) 07/09/2019   LDLCALC 56 07/09/2019  LDLDIRECT 84.0 11/04/2018   TRIG 102.0 07/09/2019   CHOLHDL 3 07/09/2019  -Continues Lipitor 40 mg daily without side effects -He is due for another lipid panel -prefers to have this at the time of his annual physical exam with PCP, which he will schedule  this month  [name], MD PhD [facility] Endocrinology

## 2020-12-19 ENCOUNTER — Other Ambulatory Visit: Payer: Self-pay | Admitting: Internal Medicine

## 2021-02-06 ENCOUNTER — Telehealth: Payer: Self-pay | Admitting: Student

## 2021-02-06 NOTE — Telephone Encounter (Signed)
° ° °  The patient called the after hours line reporting pain along his left shoulder which has radiated into his left arm at times. Says it feels like a pinched nerve and is a sharp, tingling pain. No tightness or pressure. Worse with positional movements of his shoulder and not associated with exertion. Just applied a Lidocaine patch and is waiting to see if this helps. Has not yet tried warm or cold compresses and we reviewed this as an option as well. He denies any associated chest pain or palpitations. By review of records, he had minimal CAD by Coronary CT in 12/2018. I reviewed with the patient that his pain seems most consistent with nerve pain/radiculopathy at this time and recommended he follow-up with Orthopedics if pain persists.   The patient reported he had also texted Dr. Gala Romney who felt his pain was atypical for a cardiac cause as well but said he could come to the clinic tomorrow for an EKG. I will send a note to the [facility] to make them aware and I encouraged the patient to call to arrange a time for this in the morning if wishing to have an EKG.   Signed, Ellsworth Lennox, PA-C 02/06/2021, 12:25 PM

## 2021-02-08 ENCOUNTER — Other Ambulatory Visit: Payer: Self-pay | Admitting: Internal Medicine

## 2021-02-08 DIAGNOSIS — E1142 Type 2 diabetes mellitus with diabetic polyneuropathy: Secondary | ICD-10-CM

## 2021-02-09 ENCOUNTER — Other Ambulatory Visit: Payer: Self-pay

## 2021-02-09 ENCOUNTER — Encounter: Payer: Self-pay | Admitting: Internal Medicine

## 2021-02-09 ENCOUNTER — Ambulatory Visit (INDEPENDENT_AMBULATORY_CARE_PROVIDER_SITE_OTHER): Payer: Medicare Other | Admitting: Internal Medicine

## 2021-02-09 VITALS — BP 122/80 | HR 73 | Resp 18 | Ht 77.0 in | Wt 206.6 lb

## 2021-02-09 DIAGNOSIS — M25512 Pain in left shoulder: Secondary | ICD-10-CM | POA: Diagnosis not present

## 2021-02-09 MED ORDER — METHYLPREDNISOLONE ACETATE 40 MG/ML IJ SUSP
40.0000 mg | Freq: Once | INTRAMUSCULAR | Status: AC
  Administered 2021-02-09: 40 mg via INTRAMUSCULAR

## 2021-02-09 MED ORDER — KETOROLAC TROMETHAMINE 30 MG/ML IJ SOLN
30.0000 mg | Freq: Once | INTRAMUSCULAR | Status: AC
  Administered 2021-02-09: 30 mg via INTRAMUSCULAR

## 2021-02-09 NOTE — Progress Notes (Signed)
° °  Subjective:   Patient ID: Clifford Alvarez, male    DOB: 07-Apr-1955, 66 y.o.   MRN: [id number]  HPI The patient is a 66 YO man coming in for shoulder/back pain started about 5 days ago. No numbness or weakness.   Review of Systems  Constitutional:  Negative for activity change, appetite change, fatigue, fever and unexpected weight change.  Respiratory: Negative.    Cardiovascular: Negative.   Musculoskeletal:  Positive for back pain and myalgias. Negative for arthralgias.  Skin: Negative.   Neurological:  Negative for syncope, weakness and numbness.   Objective:  Physical Exam Constitutional:      Appearance: He is well-developed.  HENT:     Head: Normocephalic and atraumatic.  Cardiovascular:     Rate and Rhythm: Normal rate and regular rhythm.  Pulmonary:     Effort: Pulmonary effort is normal. No respiratory distress.     Breath sounds: Normal breath sounds. No wheezing or rales.  Abdominal:     General: Bowel sounds are normal. There is no distension.     Palpations: Abdomen is soft.     Tenderness: There is no abdominal tenderness. There is no rebound.  Musculoskeletal:        General: Tenderness present.     Cervical back: Normal range of motion.     Comments: Left scapula region tenderness to palpation  Skin:    General: Skin is warm and dry.  Neurological:     Mental Status: He is alert and oriented to person, place, and time.     Coordination: Coordination normal.    Vitals:   02/09/21 0839  BP: 122/80  Pulse: 73  Resp: 18  SpO2: 97%  Weight: 206 lb 9.6 oz (93.7 kg)  Height: 6\' 5"  (1.956 m)    This visit occurred during the SARS-CoV-2 public health emergency.  Safety protocols were in place, including screening questions prior to the visit, additional usage of staff PPE, and extensive cleaning of exam room while observing appropriate contact time as indicated for disinfecting solutions.   Assessment & Plan:  Depo-medrol 40 mg IM and toradol 30 mg IM  given at visit

## 2021-02-09 NOTE — Assessment & Plan Note (Signed)
Given depo-medrol 40 mg IM and toradol 30 mg IM today and given stretching exercises. Advised otc and heat as well for this. If no improvement 1-2 weeks let us know.

## 2021-02-09 NOTE — Patient Instructions (Signed)
We have given you the toradol shot to help the pain today and the steroid shot to help this get better quicker.

## 2021-02-16 ENCOUNTER — Encounter: Payer: Self-pay | Admitting: Internal Medicine

## 2021-02-16 ENCOUNTER — Other Ambulatory Visit: Payer: Self-pay | Admitting: Internal Medicine

## 2021-02-16 DIAGNOSIS — E1142 Type 2 diabetes mellitus with diabetic polyneuropathy: Secondary | ICD-10-CM

## 2021-02-18 ENCOUNTER — Ambulatory Visit (INDEPENDENT_AMBULATORY_CARE_PROVIDER_SITE_OTHER): Payer: Medicare Other

## 2021-02-18 ENCOUNTER — Other Ambulatory Visit: Payer: Self-pay

## 2021-02-18 ENCOUNTER — Encounter: Payer: Self-pay | Admitting: Internal Medicine

## 2021-02-18 ENCOUNTER — Ambulatory Visit (INDEPENDENT_AMBULATORY_CARE_PROVIDER_SITE_OTHER): Payer: Medicare Other | Admitting: Internal Medicine

## 2021-02-18 DIAGNOSIS — M5412 Radiculopathy, cervical region: Secondary | ICD-10-CM

## 2021-02-18 DIAGNOSIS — E1142 Type 2 diabetes mellitus with diabetic polyneuropathy: Secondary | ICD-10-CM | POA: Diagnosis not present

## 2021-02-18 MED ORDER — HYDROCODONE-ACETAMINOPHEN 7.5-325 MG PO TABS: 1.0000 | ORAL_TABLET | Freq: Four times a day (QID) | ORAL | 0 refills | Status: DC | PRN

## 2021-02-18 MED ORDER — METHYLPREDNISOLONE 4 MG PO TBPK: ORAL_TABLET | ORAL | 0 refills | Status: DC

## 2021-02-18 MED ORDER — VALACYCLOVIR HCL 1 G PO TABS: 1000.0000 mg | ORAL_TABLET | Freq: Three times a day (TID) | ORAL | 0 refills | Status: DC

## 2021-02-18 NOTE — Assessment & Plan Note (Addendum)
Worse Shingles vs other (radiculitis) X ray C spine. MRI if not better Medrol pack Norco prn

## 2021-02-18 NOTE — Patient Instructions (Signed)
Cervical Radiculopathy ?Cervical radiculopathy means that a nerve in the neck (a cervical nerve) is pinched or bruised. This can happen because of an injury to the cervical spine (vertebrae) in the neck, or as a normal part of getting older. This condition can cause pain or loss of feeling (numbness) that runs from your neck all the way down to your arm and fingers. Often, this condition gets better with rest. Treatment may be needed if the condition does not get better. ?What are the causes? ?A neck injury. ?A bulging disk in your spine. ?Sudden muscle tightening (muscle spasms). ?Tight muscles in your neck due to overuse. ?Arthritis. ?Breakdown in the bones and joints of the spine (spondylosis) due to getting older. ?Bone spurs that form near the nerves in the neck. ?What are the signs or symptoms? ?Pain. The pain may: ?Run from the neck to the arm and hand. ?Be very bad or irritating. ?Get worse when you move your neck. ?Loss of feeling or tingling in your arm or hand. ?Weakness in your arm or hand, in very bad cases. ?How is this treated? ?In many cases, treatment is not needed for this condition. With rest, the condition often gets better over time. If treatment is needed, options may include: ?Wearing a soft neck collar (cervical collar) for short periods of time. ?Doing exercises (physical therapy) to strengthen your neck muscles. ?Taking medicines. ?Having shots (injections) in your spine, in very bad cases. ?Having surgery. This may be needed if other treatments do not help. The type of surgery that is used will depend on the cause of your condition. ?Follow these instructions at home: ?If you have a soft neck collar: ?Wear it as told by your doctor. Take it off only as told by your doctor. ?Ask your doctor if you can take the collar off for cleaning and bathing. If you are allowed to take the collar off for cleaning or bathing: ?Follow instructions from your doctor about how to take off the collar  safely. ?Clean the collar by wiping it with mild soap and water and drying it completely. ?Take out any removable pads in the collar every 1-2 days. Wash them by hand with soap and water. Let them air-dry completely before you put them back in the collar. ?Check your skin under the collar for redness or sores. If you see any, tell your doctor. ?Managing pain ?  ?Take over-the-counter and prescription medicines only as told by your doctor. ?If told, put ice on the painful area. To do this: ?If you have a soft neck collar, take if off as told by your doctor. ?Put ice in a plastic bag. ?Place a towel between your skin and the bag. ?Leave the ice on for 20 minutes, 2-3 times a day. ?Take off the ice if your skin turns bright red. This is very important. If you cannot feel pain, heat, or cold, you have a greater risk of damage to the area. ?If using ice does not help, you can try using heat. Use the heat source that your doctor recommends, such as a moist heat pack or a heating pad. ?Place a towel between your skin and the heat source. ?Leave the heat on for 20-30 minutes. ?Take off the heat if your skin turns bright red. This is very important. If you cannot feel pain, heat, or cold, you have a greater risk of getting burned. ?You may try a gentle neck and shoulder rub (massage). ?Activity ?Rest as needed. ?Return to your normal   activities when your doctor says that it is safe. ?Do exercises as told by your doctor or physical therapist. ?You may have to avoid lifting. Ask your doctor how much you can safely lift. ?General instructions ?Use a flat pillow when you sleep. ?Do not drive while wearing a soft neck collar. If you do not have a soft neck collar, ask your doctor if it is safe to drive while your neck heals. ?Ask your doctor if you should avoid driving or using machines while you are taking your medicine. ?Do not smoke or use any products that contain nicotine or tobacco. If you need help quitting, ask your  doctor. ?Keep all follow-up visits. ?Contact a doctor if: ?Your condition does not get better with treatment. ?Get help right away if: ?Your pain gets worse and medicine does not help. ?You lose feeling or feel weak in your hand, arm, face, or leg. ?You have a high fever. ?Your neck is stiff. ?You cannot control when you poop or pee (have incontinence). ?You have trouble with walking, balance, or talking. ?Summary ?Cervical radiculopathy means that a nerve in the neck is pinched or bruised. ?A nerve can get pinched from a bulging disk, arthritis, an injury to the neck, or other causes. ?Symptoms include pain, tingling, or loss of feeling that goes from the neck to the arm or hand. ?Weakness in your arm or hand can happen in very bad cases. ?Treatment may include resting, wearing a soft neck collar, and doing exercises. You might need to take medicines for pain. In very bad cases, shots or surgery may be needed. ?This information is not intended to replace advice given to you by your health care provider. Make sure you discuss any questions you have with your health care provider. ?Document Revised: 06/24/2020 Document Reviewed: 06/24/2020 ?Elsevier Patient Education ? 2022 [employer] ? ?

## 2021-02-18 NOTE — Progress Notes (Signed)
Subjective:  Patient ID: Clifford Alvarez, male    DOB: Oct 31, 1955  Age: 66 y.o. MRN: [id number]  CC: No chief complaint on file.   HPI Clifford Alvarez presents for lower neck pain x 2 wks, now irradiating down to the L shoulder 8/10  Outpatient Medications Prior to Visit  Medication Sig Dispense Refill   Alpha-Lipoic Acid 300 MG TABS Take 2 tablets by mouth in the morning and at bedtime.     aspirin EC 81 MG tablet Take 1 tablet (81 mg total) by mouth daily. 90 tablet 3   atorvastatin (LIPITOR) 40 MG tablet TAKE 1 TABLET BY MOUTH DAILY AT 6 PM. 90 tablet 3   B Complex-C (SUPER B COMPLEX/VITAMIN C) TABS 1 tablet by mouth daily     Cholecalciferol (VITAMIN D3) 50 MCG (2000 UT) capsule TAKE 1 CAPSULE BY MOUTH EVERY DAY 90 capsule 3   clindamycin (CLEOCIN) 300 MG capsule Take 300 mg by mouth every 6 (six) hours. For dental procedures     dapagliflozin propanediol (FARXIGA) 5 MG TABS tablet Take 1 tablet (5 mg total) by mouth daily. 90 tablet 3   dorzolamide-timolol (COSOPT) 22.3-6.8 MG/ML ophthalmic solution Place 1 drop into both eyes 2 (two) times daily.     Lancets (ONETOUCH DELICA PLUS Q000111Q) MISC USE TO TEST BLOOD SUGAR 3 TIMES DAILY AS INSTRUCTED. 100 each 4   latanoprost (XALATAN) 0.005 % ophthalmic solution Place 1 drop into both eyes every evening.     losartan (COZAAR) 50 MG tablet Take 1 tablet (50 mg total) by mouth daily. 30 tablet 11   metFORMIN (GLUCOPHAGE) 1000 MG tablet TAKE 1 TABLET BY MOUTH TWICE A DAY WITH A MEAL 180 tablet 3   ONETOUCH VERIO test strip USE AS INSTRUCTED TO CHECK BLOOD SUGAR 2 TIMES A DAY E11.42 100 strip 1   No facility-administered medications prior to visit.    ROS: Review of Systems  Constitutional:  Negative for appetite change, fatigue and unexpected weight change.  HENT:  Negative for congestion, nosebleeds, sneezing, sore throat and trouble swallowing.   Eyes:  Negative for itching and visual disturbance.  Respiratory:  Negative  for cough.   Cardiovascular:  Negative for chest pain, palpitations and leg swelling.  Gastrointestinal:  Negative for abdominal distention, blood in stool, diarrhea and nausea.  Genitourinary:  Negative for frequency and hematuria.  Musculoskeletal:  Positive for arthralgias, neck pain and neck stiffness. Negative for back pain, gait problem and joint swelling.  Skin:  Negative for rash.  Neurological:  Negative for dizziness, tremors, speech difficulty and weakness.  Psychiatric/Behavioral:  Positive for sleep disturbance. Negative for agitation and dysphoric mood. The patient is not nervous/anxious.    Objective:  BP (!) 100/58 (BP Location: Left Arm, Patient Position: Sitting, Cuff Size: Large)    Pulse 70    Temp 98.9 F (37.2 C) (Oral)    Ht 6\' 5"  (1.956 m)    Wt 205 lb (93 kg)    SpO2 93%    BMI 24.31 kg/m   BP Readings from Last 3 Encounters:  02/18/21 (!) 100/58  02/09/21 122/80  12/13/20 118/70    Wt Readings from Last 3 Encounters:  02/18/21 205 lb (93 kg)  02/09/21 206 lb 9.6 oz (93.7 kg)  12/13/20 207 lb (93.9 kg)    Physical Exam Constitutional:      General: He is not in acute distress.    Appearance: He is well-developed.     Comments: NAD  Eyes:  Conjunctiva/sclera: Conjunctivae normal.     Pupils: Pupils are equal, round, and reactive to light.  Neck:     Thyroid: No thyromegaly.     Vascular: No JVD.  Cardiovascular:     Rate and Rhythm: Normal rate and regular rhythm.     Heart sounds: Normal heart sounds. No murmur heard.   No friction rub. No gallop.  Pulmonary:     Effort: Pulmonary effort is normal. No respiratory distress.     Breath sounds: Normal breath sounds. No wheezing or rales.  Chest:     Chest wall: No tenderness.  Abdominal:     General: Bowel sounds are normal. There is no distension.     Palpations: Abdomen is soft. There is no mass.     Tenderness: There is no abdominal tenderness. There is no guarding or rebound.   Musculoskeletal:        General: Tenderness present. Normal range of motion.     Cervical back: Normal range of motion.  Lymphadenopathy:     Cervical: No cervical adenopathy.  Skin:    General: Skin is warm and dry.     Findings: No rash.  Neurological:     Mental Status: He is alert and oriented to person, place, and time.     Cranial Nerves: No cranial nerve deficit.     Motor: No abnormal muscle tone.     Coordination: Coordination normal.     Gait: Gait normal.     Deep Tendon Reflexes: Reflexes are normal and symmetric.  Psychiatric:        Behavior: Behavior normal.        Thought Content: Thought content normal.        Judgment: Judgment normal.   Small crusted sores  L post shoulder L shoulder, arm - painful  Lab Results  Component Value Date   WBC 6.2 07/09/2019   HGB 14.6 07/09/2019   HCT 44.0 07/09/2019   PLT 143.0 (L) 07/09/2019   GLUCOSE 127 (H) 11/05/2019   CHOL 113 07/09/2019   TRIG 102.0 07/09/2019   HDL 37.20 (L) 07/09/2019   LDLDIRECT 84.0 11/04/2018   LDLCALC 56 07/09/2019   ALT 14 07/09/2019   AST 17 07/09/2019   NA 141 11/05/2019   K 4.9 11/05/2019   CL 107 11/05/2019   CREATININE 1.02 11/05/2019   BUN 13 11/05/2019   CO2 26 11/05/2019   TSH 3.25 07/09/2019   PSA 0.72 07/09/2019   HGBA1C 6.0 (A) 12/13/2020   MICROALBUR <0.7 07/09/2019    No results found.  Assessment & Plan:   Problem List Items Addressed This Visit     Type 2 diabetes, controlled, with peripheral neuropathy (HCC) (Chronic)    Steroids effects on DM were discussed      Cervical radiculitis    Worse Shingles vs other (radiculitis) X ray C spine. MRI if not better Medrol pack Norco prn        Relevant Orders   DG Cervical Spine Complete (Completed)      Meds ordered this encounter  Medications   methylPREDNISolone (MEDROL DOSEPAK) 4 MG TBPK tablet    Sig: As directed    Dispense:  21 tablet    Refill:  0   HYDROcodone-acetaminophen (NORCO) 7.5-325  MG tablet    Sig: Take 1 tablet by mouth every 6 (six) hours as needed for moderate pain.    Dispense:  20 tablet    Refill:  0   valACYclovir (VALTREX) 1000 MG tablet  Sig: Take 1 tablet (1,000 mg total) by mouth 3 (three) times daily.    Dispense:  21 tablet    Refill:  0      Follow-up: Return in about 1 week (around 02/25/2021) for a follow-up visit.  Walker Kehr, MD

## 2021-02-23 ENCOUNTER — Encounter: Payer: Self-pay | Admitting: Internal Medicine

## 2021-02-23 ENCOUNTER — Ambulatory Visit (INDEPENDENT_AMBULATORY_CARE_PROVIDER_SITE_OTHER): Payer: Medicare Other | Admitting: Internal Medicine

## 2021-02-23 ENCOUNTER — Other Ambulatory Visit: Payer: Self-pay

## 2021-02-23 VITALS — BP 144/64 | HR 89 | Temp 98.8°F | Ht 77.0 in | Wt 201.0 lb

## 2021-02-23 DIAGNOSIS — M25512 Pain in left shoulder: Secondary | ICD-10-CM | POA: Diagnosis not present

## 2021-02-23 DIAGNOSIS — M5412 Radiculopathy, cervical region: Secondary | ICD-10-CM | POA: Diagnosis not present

## 2021-02-23 MED ORDER — HYDROCODONE-ACETAMINOPHEN 7.5-325 MG PO TABS: 1.0000 | ORAL_TABLET | Freq: Four times a day (QID) | ORAL | 0 refills | Status: DC | PRN

## 2021-02-23 MED ORDER — METHYLPREDNISOLONE 4 MG PO TBPK: ORAL_TABLET | ORAL | 0 refills | Status: DC

## 2021-02-23 NOTE — Assessment & Plan Note (Signed)
Steroids effects on DM were discussed

## 2021-02-23 NOTE — Patient Instructions (Signed)
For a mild COVID-19 case - take zinc 50 mg a day for 1 week, vitamin C 1000 mg daily for 1 week, vitamin D2 50,000 units weekly for 2 months (unless  taking vitamin D daily already), an antioxidant Quercetin 500 mg twice a day for 1 week (if you can get it quick enough). Take Allegra or Benadryl.  Maintain good oral hydration and take Tylenol for high fever.  Call if problems. Isolate for 5 days, then wear a mask for 5 days per CDC.  

## 2021-02-24 NOTE — Progress Notes (Signed)
Subjective:  Patient ID: Clifford Alvarez, male    DOB: 1955-10-17  Age: 66 y.o. MRN: [id number]   CC: Follow-up (Shoulder pain )     HPI Clifford Alvarez presents for L arm pain - 50% better Pain is 4-8/10         Outpatient Medications Prior to Visit  Medication Sig Dispense Refill   Alpha-Lipoic Acid 300 MG TABS Take 2 tablets by mouth in the morning and at bedtime.       aspirin EC 81 MG tablet Take 1 tablet (81 mg total) by mouth daily. 90 tablet 3   atorvastatin (LIPITOR) 40 MG tablet TAKE 1 TABLET BY MOUTH DAILY AT 6 PM. 90 tablet 3   B Complex-C (SUPER B COMPLEX/VITAMIN C) TABS 1 tablet by mouth daily       Cholecalciferol (VITAMIN D3) 50 MCG (2000 UT) capsule TAKE 1 CAPSULE BY MOUTH EVERY DAY 90 capsule 3   clindamycin (CLEOCIN) 300 MG capsule Take 300 mg by mouth every 6 (six) hours. For dental procedures       dapagliflozin propanediol (FARXIGA) 5 MG TABS tablet Take 1 tablet (5 mg total) by mouth daily. 90 tablet 3   dorzolamide-timolol (COSOPT) 22.3-6.8 MG/ML ophthalmic solution Place 1 drop into both eyes 2 (two) times daily.       Lancets (ONETOUCH DELICA PLUS Q000111Q) MISC USE TO TEST BLOOD SUGAR 3 TIMES DAILY AS INSTRUCTED. 100 each 4   latanoprost (XALATAN) 0.005 % ophthalmic solution Place 1 drop into both eyes every evening.       losartan (COZAAR) 50 MG tablet Take 1 tablet (50 mg total) by mouth daily. 30 tablet 11   metFORMIN (GLUCOPHAGE) 1000 MG tablet TAKE 1 TABLET BY MOUTH TWICE A DAY WITH A MEAL 180 tablet 3   ONETOUCH VERIO test strip USE AS INSTRUCTED TO CHECK BLOOD SUGAR 2 TIMES A DAY E11.42 100 strip 1   valACYclovir (VALTREX) 1000 MG tablet Take 1 tablet (1,000 mg total) by mouth 3 (three) times daily. 21 tablet 0   HYDROcodone-acetaminophen (NORCO) 7.5-325 MG tablet Take 1 tablet by mouth every 6 (six) hours as needed for moderate pain. 20 tablet 0   methylPREDNISolone (MEDROL DOSEPAK) 4 MG TBPK tablet As directed 21 tablet 0    No  facility-administered medications prior to visit.      ROS: Review of Systems  Constitutional:  Negative for appetite change, fatigue and unexpected weight change.  HENT:  Negative for congestion, nosebleeds, sneezing, sore throat and trouble swallowing.   Eyes:  Negative for itching and visual disturbance.  Respiratory:  Negative for cough.   Cardiovascular:  Negative for chest pain, palpitations and leg swelling.  Gastrointestinal:  Negative for abdominal distention, blood in stool, diarrhea and nausea.  Genitourinary:  Negative for frequency and hematuria.  Musculoskeletal:  Positive for arthralgias, neck pain and neck stiffness. Negative for back pain, gait problem and joint swelling.  Skin:  Negative for rash.  Neurological:  Negative for dizziness, tremors, speech difficulty and weakness.  Psychiatric/Behavioral:  Negative for agitation, dysphoric mood and sleep disturbance. The patient is not nervous/anxious.     Objective:  BP (!) 144/64    Pulse 89    Temp 98.8 F (37.1 C) (Oral)    Ht 6\' 5"  (1.956 m)    Wt 201 lb (91.2 kg)    SpO2 99%    BMI 23.84 kg/m       BP Readings from Last 3 Encounters:  02/23/21 Marland Kitchen)  144/64  02/18/21 (!) 100/58  02/09/21 122/80         Wt Readings from Last 3 Encounters:  02/23/21 201 lb (91.2 kg)  02/18/21 205 lb (93 kg)  02/09/21 206 lb 9.6 oz (93.7 kg)      Physical Exam Constitutional:      General: He is not in acute distress.    Appearance: He is well-developed.     Comments: NAD  Eyes:     Conjunctiva/sclera: Conjunctivae normal.     Pupils: Pupils are equal, round, and reactive to light.  Neck:     Thyroid: No thyromegaly.     Vascular: No JVD.  Cardiovascular:     Rate and Rhythm: Normal rate and regular rhythm.     Heart sounds: Normal heart sounds. No murmur heard.   No friction rub. No gallop.  Pulmonary:     Effort: Pulmonary effort is normal. No respiratory distress.     Breath sounds: Normal breath sounds. No  wheezing or rales.  Chest:     Chest wall: No tenderness.  Abdominal:     General: Bowel sounds are normal. There is no distension.     Palpations: Abdomen is soft. There is no mass.     Tenderness: There is no abdominal tenderness. There is no guarding or rebound.  Musculoskeletal:        General: No tenderness. Normal range of motion.     Cervical back: Normal range of motion.  Lymphadenopathy:     Cervical: No cervical adenopathy.  Skin:    General: Skin is warm and dry.     Findings: No rash.  Neurological:     Mental Status: He is alert and oriented to person, place, and time.     Cranial Nerves: No cranial nerve deficit.     Motor: No abnormal muscle tone.     Coordination: Coordination normal.     Gait: Gait normal.     Deep Tendon Reflexes: Reflexes are normal and symmetric.  Psychiatric:        Behavior: Behavior normal.        Thought Content: Thought content normal.        Judgment: Judgment normal.      Recent Labs       Lab Results  Component Value Date    WBC 6.2 07/09/2019    HGB 14.6 07/09/2019    HCT 44.0 07/09/2019    PLT 143.0 (L) 07/09/2019    GLUCOSE 127 (H) 11/05/2019    CHOL 113 07/09/2019    TRIG 102.0 07/09/2019    HDL 37.20 (L) 07/09/2019    LDLDIRECT 84.0 11/04/2018    LDLCALC 56 07/09/2019    ALT 14 07/09/2019    AST 17 07/09/2019    NA 141 11/05/2019    K 4.9 11/05/2019    CL 107 11/05/2019    CREATININE 1.02 11/05/2019    BUN 13 11/05/2019    CO2 26 11/05/2019    TSH 3.25 07/09/2019    PSA 0.72 07/09/2019    HGBA1C 6.0 (A) 12/13/2020    MICROALBUR <0.7 07/09/2019        No results found.   Assessment & Plan:    Problem List Items Addressed This Visit       Acute pain of left shoulder    Relevant Orders    MR Cervical Spine Wo Contrast    Cervical radiculitis - Primary    Relevant Orders    MR Cervical Spine Wo  Contrast            Meds ordered this encounter  Medications   HYDROcodone-acetaminophen (NORCO) 7.5-325  MG tablet      Sig: Take 1 tablet by mouth every 6 (six) hours as needed for severe pain.      Dispense:  60 tablet      Refill:  0   methylPREDNISolone (MEDROL DOSEPAK) 4 MG TBPK tablet      Sig: As directed      Dispense:  21 tablet      Refill:  0        Follow-up: Return in about 4 weeks (around 03/23/2021) for a follow-up visit.   Walker Kehr, MD

## 2021-03-01 ENCOUNTER — Ambulatory Visit: Payer: BLUE CROSS/BLUE SHIELD | Admitting: Internal Medicine

## 2021-03-03 ENCOUNTER — Other Ambulatory Visit: Payer: Self-pay | Admitting: Internal Medicine

## 2021-03-03 ENCOUNTER — Ambulatory Visit
Admission: RE | Admit: 2021-03-03 | Discharge: 2021-03-03 | Disposition: A | Discharge status: Home / Self Care | Payer: Medicare Other | Source: Ambulatory Visit | Attending: Internal Medicine | Admitting: Internal Medicine

## 2021-03-03 ENCOUNTER — Other Ambulatory Visit: Payer: Self-pay

## 2021-03-03 ENCOUNTER — Encounter: Payer: Self-pay | Admitting: Internal Medicine

## 2021-03-03 DIAGNOSIS — M25512 Pain in left shoulder: Secondary | ICD-10-CM

## 2021-03-03 DIAGNOSIS — M5412 Radiculopathy, cervical region: Secondary | ICD-10-CM

## 2021-03-04 ENCOUNTER — Encounter: Payer: Self-pay | Admitting: Internal Medicine

## 2021-03-22 ENCOUNTER — Encounter: Payer: Self-pay | Admitting: Internal Medicine

## 2021-03-22 ENCOUNTER — Other Ambulatory Visit: Payer: Self-pay

## 2021-03-22 ENCOUNTER — Ambulatory Visit (INDEPENDENT_AMBULATORY_CARE_PROVIDER_SITE_OTHER): Payer: Medicare Other | Admitting: Internal Medicine

## 2021-03-22 DIAGNOSIS — M5412 Radiculopathy, cervical region: Secondary | ICD-10-CM

## 2021-03-22 DIAGNOSIS — K921 Melena: Secondary | ICD-10-CM | POA: Diagnosis not present

## 2021-03-22 DIAGNOSIS — I2510 Atherosclerotic heart disease of native coronary artery without angina pectoris: Secondary | ICD-10-CM

## 2021-03-22 MED ORDER — HYDROCODONE-ACETAMINOPHEN 7.5-325 MG PO TABS: 1.0000 | ORAL_TABLET | Freq: Four times a day (QID) | ORAL | 0 refills | Status: DC | PRN

## 2021-03-22 NOTE — Assessment & Plan Note (Signed)
New ?Use Colace when on Norco ?

## 2021-03-22 NOTE — Progress Notes (Signed)
? ?Subjective:  ?Patient ID: Clifford Alvarez, male    DOB: September 14, 1955  Age: 66 y.o. MRN: [id number] ? ?CC: Follow-up ? ? ?HPI ?Clifford Alvarez presents for cervical pain, hand tingling ?Clifford Alvarez saw Dr Wynetta Emery - planning to have  ? ? ?Outpatient Medications Prior to Visit  ?Medication Sig Dispense Refill  ? Alpha-Lipoic Acid 300 MG TABS Take 2 tablets by mouth in the morning and at bedtime.    ? aspirin EC 81 MG tablet Take 1 tablet (81 mg total) by mouth daily. 90 tablet 3  ? atorvastatin (LIPITOR) 40 MG tablet TAKE 1 TABLET BY MOUTH DAILY AT 6 PM. 90 tablet 3  ? B Complex-C (SUPER B COMPLEX/VITAMIN C) TABS 1 tablet by mouth daily    ? Cholecalciferol (VITAMIN D3) 50 MCG (2000 UT) capsule TAKE 1 CAPSULE BY MOUTH EVERY DAY 90 capsule 3  ? clindamycin (CLEOCIN) 300 MG capsule Take 300 mg by mouth every 6 (six) hours. For dental procedures    ? cyclobenzaprine (FLEXERIL) 10 MG tablet Take 10 mg by mouth 3 (three) times daily.    ? dapagliflozin propanediol (FARXIGA) 5 MG TABS tablet Take 1 tablet (5 mg total) by mouth daily. 90 tablet 3  ? dorzolamide-timolol (COSOPT) 22.3-6.8 MG/ML ophthalmic solution Place 1 drop into both eyes 2 (two) times daily.    ? Lancets (ONETOUCH DELICA PLUS LANCET30G) MISC USE TO TEST BLOOD SUGAR 3 TIMES DAILY AS INSTRUCTED. 100 each 4  ? latanoprost (XALATAN) 0.005 % ophthalmic solution Place 1 drop into both eyes every evening.    ? losartan (COZAAR) 50 MG tablet Take 1 tablet (50 mg total) by mouth daily. 30 tablet 11  ? metFORMIN (GLUCOPHAGE) 1000 MG tablet TAKE 1 TABLET BY MOUTH TWICE A DAY WITH A MEAL 180 tablet 3  ? ONETOUCH VERIO test strip USE AS INSTRUCTED TO CHECK BLOOD SUGAR 2 TIMES A DAY E11.42 100 strip 1  ? HYDROcodone-acetaminophen (NORCO) 7.5-325 MG tablet Take 1 tablet by mouth every 6 (six) hours as needed for severe pain. 60 tablet 0  ? methylPREDNISolone (MEDROL DOSEPAK) 4 MG TBPK tablet As directed 21 tablet 0  ? valACYclovir (VALTREX) 1000 MG tablet Take 1 tablet  (1,000 mg total) by mouth 3 (three) times daily. 21 tablet 0  ? ?No facility-administered medications prior to visit.  ? ? ?ROS: ?Review of Systems  ?Constitutional:  Negative for appetite change, fatigue and unexpected weight change.  ?HENT:  Negative for congestion, nosebleeds, sneezing, sore throat and trouble swallowing.   ?Eyes:  Negative for itching and visual disturbance.  ?Respiratory:  Negative for cough.   ?Cardiovascular:  Negative for chest pain, palpitations and leg swelling.  ?Gastrointestinal:  Negative for abdominal distention, blood in stool, diarrhea and nausea.  ?Genitourinary:  Negative for frequency and hematuria.  ?Musculoskeletal:  Negative for back pain, gait problem, joint swelling and neck pain.  ?Skin:  Negative for rash.  ?Neurological:  Negative for dizziness, tremors, speech difficulty and weakness.  ?Psychiatric/Behavioral:  Negative for agitation, dysphoric mood and sleep disturbance. The patient is not nervous/anxious.   ? ?Objective:  ?BP (!) 108/58 (BP Location: Right Arm, Patient Position: Sitting, Cuff Size: Large)   Pulse (!) 101   Temp 98.9 ?F (37.2 ?C) (Oral)   Ht 6\' 5"  (1.956 m)   Wt 200 lb (90.7 kg)   SpO2 97%   BMI 23.72 kg/m?  ? ?BP Readings from Last 3 Encounters:  ?03/31/21 140/80  ?03/22/21 (!) 108/58  ?02/23/21 (!) 144/64  ? ? ?  Wt Readings from Last 3 Encounters:  ?03/31/21 199 lb 9.6 oz (90.5 kg)  ?03/22/21 200 lb (90.7 kg)  ?02/23/21 201 lb (91.2 kg)  ? ? ?Physical Exam ?Constitutional:   ?   General: He is not in acute distress. ?   Appearance: He is well-developed.  ?   Comments: NAD  ?Eyes:  ?   Conjunctiva/sclera: Conjunctivae normal.  ?   Pupils: Pupils are equal, round, and reactive to light.  ?Neck:  ?   Thyroid: No thyromegaly.  ?   Vascular: No JVD.  ?Cardiovascular:  ?   Rate and Rhythm: Normal rate and regular rhythm.  ?   Heart sounds: Normal heart sounds. No murmur heard. ?  No friction rub. No gallop.  ?Pulmonary:  ?   Effort: Pulmonary effort is  normal. No respiratory distress.  ?   Breath sounds: Normal breath sounds. No wheezing or rales.  ?Chest:  ?   Chest wall: No tenderness.  ?Abdominal:  ?   General: Bowel sounds are normal. There is no distension.  ?   Palpations: Abdomen is soft. There is no mass.  ?   Tenderness: There is no abdominal tenderness. There is no guarding or rebound.  ?Musculoskeletal:     ?   General: No tenderness. Normal range of motion.  ?   Cervical back: Normal range of motion.  ?Lymphadenopathy:  ?   Cervical: No cervical adenopathy.  ?Skin: ?   General: Skin is warm and dry.  ?   Findings: No rash.  ?Neurological:  ?   Mental Status: He is alert and oriented to person, place, and time.  ?   Cranial Nerves: No cranial nerve deficit.  ?   Motor: No abnormal muscle tone.  ?   Coordination: Coordination normal.  ?   Gait: Gait normal.  ?   Deep Tendon Reflexes: Reflexes are normal and symmetric.  ?Psychiatric:     ?   Behavior: Behavior normal.     ?   Thought Content: Thought content normal.     ?   Judgment: Judgment normal.  ? ? ? A total time of 30 minutes was spent preparing to see the patient, reviewing tests, x-rays, operative reports and other medical records.  Also, obtaining history and performing comprehensive physical exam.  Additionally, counseling the patient regarding the above listed issues.   Finally, documenting clinical information in the health records, coordination of care, educating the patient re - radiculitis, opioids.  ? ?Lab Results  ?Component Value Date  ? WBC 7.6 03/31/2021  ? HGB 14.3 03/31/2021  ? HCT 43.9 03/31/2021  ? PLT 179 03/31/2021  ? GLUCOSE 131 (H) 03/31/2021  ? CHOL 113 07/09/2019  ? TRIG 102.0 07/09/2019  ? HDL 37.20 (L) 07/09/2019  ? LDLDIRECT 84.0 11/04/2018  ? LDLCALC 56 07/09/2019  ? ALT 18 03/31/2021  ? AST 20 03/31/2021  ? NA 140 03/31/2021  ? K 4.5 03/31/2021  ? CL 108 03/31/2021  ? CREATININE 1.03 03/31/2021  ? BUN 15 03/31/2021  ? CO2 26 03/31/2021  ? TSH 2.385 03/31/2021  ? PSA  0.72 07/09/2019  ? HGBA1C 6.0 (A) 12/13/2020  ? MICROALBUR <0.7 07/09/2019  ? ? ?MR Cervical Spine Wo Contrast ? ?Result Date: 03/03/2021 ?CLINICAL DATA:  Neck and upper back pain, chronic. Lower neck pain for 3 weeks now radiating to left shoulder and arm with left arm numbness EXAM: MRI CERVICAL SPINE WITHOUT CONTRAST TECHNIQUE: Multiplanar, multisequence MR imaging of the cervical spine  was performed. No intravenous contrast was administered. COMPARISON:  Cervical spine radiographs 02/18/2021 FINDINGS: Alignment: Normal. Vertebrae: Vertebral body heights are preserved, without evidence of acute injury. There is no marrow signal abnormality. There is no marrow edema. Cord: Normal in signal and morphology. Posterior Fossa, vertebral arteries, paraspinal tissues: The imaged posterior fossa is unremarkable. The vertebral artery flow voids are present. The paraspinal soft tissues are unremarkable. Disc levels: There is disc desiccation without significant loss of height throughout the cervical spine. C2-C3: There is mild uncovertebral and bilateral facet arthropathy resulting in mild to moderate right and mild left neural foraminal stenosis without significant spinal canal stenosis C3-C4: There is a broad-based posterior disc osteophyte complex eccentric to the right with right worse than left uncovertebral ridging and bilateral facet arthropathy resulting in severe bilateral neural foraminal stenosis and moderate spinal canal stenosis. C4-C5: There is bilateral uncovertebral ridging and bilateral facet arthropathy resulting in moderate to severe bilateral neural foraminal stenosis without significant spinal canal stenosis. C5-C6: There is a posterior disc osteophyte complex with a prominent right paracentral spur and bilateral facet arthropathy resulting in mild-to-moderate spinal canal stenosis with slight indentation of the right ventral aspect of the cord without significant neural foraminal stenosis. C6-C7: There  is a mild posterior disc osteophyte complex with bilateral uncovertebral ridging and probable bilateral foraminal protrusions, and mild bilateral facet arthropathy resulting in moderate left and severe right neu

## 2021-03-22 NOTE — Assessment & Plan Note (Addendum)
Better on Norco prn - tid-qid ?F/u w/Dr Saintclair Halsted pending ?Medrol pack ?Norco prn ? Potential benefits of a short/long term opioids use as well as potential risks (i.e. addiction risk, apnea etc) and complications (i.e. Somnolence, constipation and others) were explained to the patient and were aknowledged. ? ?

## 2021-03-23 LAB — COLOGUARD: COLOGUARD: NEGATIVE

## 2021-03-30 ENCOUNTER — Ambulatory Visit (HOSPITAL_COMMUNITY)
Admission: RE | Admit: 2021-03-30 | Discharge: 2021-03-30 | Disposition: A | Discharge status: Home / Self Care | Payer: Medicare Other | Source: Ambulatory Visit | Attending: Internal Medicine | Admitting: Internal Medicine

## 2021-03-30 DIAGNOSIS — Q231 Congenital insufficiency of aortic valve: Secondary | ICD-10-CM | POA: Diagnosis not present

## 2021-03-30 DIAGNOSIS — I4892 Unspecified atrial flutter: Secondary | ICD-10-CM | POA: Insufficient documentation

## 2021-03-30 DIAGNOSIS — I350 Nonrheumatic aortic (valve) stenosis: Secondary | ICD-10-CM | POA: Insufficient documentation

## 2021-03-30 DIAGNOSIS — I509 Heart failure, unspecified: Secondary | ICD-10-CM | POA: Diagnosis not present

## 2021-03-30 DIAGNOSIS — I2510 Atherosclerotic heart disease of native coronary artery without angina pectoris: Secondary | ICD-10-CM | POA: Diagnosis not present

## 2021-03-30 DIAGNOSIS — E119 Type 2 diabetes mellitus without complications: Secondary | ICD-10-CM | POA: Insufficient documentation

## 2021-03-30 LAB — ECHOCARDIOGRAM COMPLETE
AR max vel: 1.58 cm2
AV Area VTI: 1.56 cm2
AV Area mean vel: 1.51 cm2
AV Mean grad: 12.3 mmHg
AV Peak grad: 22.7 mmHg
Ao pk vel: 2.38 m/s
Calc EF: 34.1 %
S' Lateral: 3.1 cm
Single Plane A2C EF: 34.2 %
Single Plane A4C EF: 29.9 %

## 2021-03-30 NOTE — Progress Notes (Signed)
?  Echocardiogram ?2D Echocardiogram has been performed. ? ?Clifford Alvarez ?03/30/2021, 2:45 PM ?

## 2021-03-31 ENCOUNTER — Other Ambulatory Visit (HOSPITAL_COMMUNITY): Payer: Self-pay | Admitting: Internal Medicine

## 2021-03-31 ENCOUNTER — Ambulatory Visit (HOSPITAL_COMMUNITY)
Admission: RE | Admit: 2021-03-31 | Discharge: 2021-03-31 | Disposition: A | Discharge status: Home / Self Care | Payer: Medicare Other | Source: Ambulatory Visit | Attending: Internal Medicine | Admitting: Internal Medicine

## 2021-03-31 ENCOUNTER — Encounter (HOSPITAL_COMMUNITY): Payer: Self-pay | Admitting: Internal Medicine

## 2021-03-31 VITALS — BP 140/80 | HR 66 | Wt 199.6 lb

## 2021-03-31 DIAGNOSIS — M25512 Pain in left shoulder: Secondary | ICD-10-CM | POA: Insufficient documentation

## 2021-03-31 DIAGNOSIS — R9431 Abnormal electrocardiogram [ECG] [EKG]: Secondary | ICD-10-CM | POA: Diagnosis not present

## 2021-03-31 DIAGNOSIS — I482 Chronic atrial fibrillation, unspecified: Secondary | ICD-10-CM | POA: Insufficient documentation

## 2021-03-31 DIAGNOSIS — Z952 Presence of prosthetic heart valve: Secondary | ICD-10-CM

## 2021-03-31 DIAGNOSIS — I480 Paroxysmal atrial fibrillation: Secondary | ICD-10-CM | POA: Diagnosis present

## 2021-03-31 DIAGNOSIS — Z8679 Personal history of other diseases of the circulatory system: Secondary | ICD-10-CM | POA: Diagnosis not present

## 2021-03-31 DIAGNOSIS — M546 Pain in thoracic spine: Secondary | ICD-10-CM | POA: Diagnosis not present

## 2021-03-31 DIAGNOSIS — M489 Spondylopathy, unspecified: Secondary | ICD-10-CM | POA: Insufficient documentation

## 2021-03-31 DIAGNOSIS — Z7982 Long term (current) use of aspirin: Secondary | ICD-10-CM | POA: Diagnosis not present

## 2021-03-31 DIAGNOSIS — I2510 Atherosclerotic heart disease of native coronary artery without angina pectoris: Secondary | ICD-10-CM | POA: Diagnosis not present

## 2021-03-31 DIAGNOSIS — Z8249 Family history of ischemic heart disease and other diseases of the circulatory system: Secondary | ICD-10-CM | POA: Insufficient documentation

## 2021-03-31 DIAGNOSIS — I110 Hypertensive heart disease with heart failure: Secondary | ICD-10-CM | POA: Insufficient documentation

## 2021-03-31 DIAGNOSIS — R0683 Snoring: Secondary | ICD-10-CM

## 2021-03-31 DIAGNOSIS — Z7984 Long term (current) use of oral hypoglycemic drugs: Secondary | ICD-10-CM | POA: Diagnosis not present

## 2021-03-31 DIAGNOSIS — I4891 Unspecified atrial fibrillation: Secondary | ICD-10-CM

## 2021-03-31 DIAGNOSIS — I483 Typical atrial flutter: Secondary | ICD-10-CM | POA: Diagnosis not present

## 2021-03-31 DIAGNOSIS — Q231 Congenital insufficiency of aortic valve: Secondary | ICD-10-CM

## 2021-03-31 DIAGNOSIS — E1136 Type 2 diabetes mellitus with diabetic cataract: Secondary | ICD-10-CM | POA: Insufficient documentation

## 2021-03-31 DIAGNOSIS — Z953 Presence of xenogenic heart valve: Secondary | ICD-10-CM | POA: Insufficient documentation

## 2021-03-31 DIAGNOSIS — Z950 Presence of cardiac pacemaker: Secondary | ICD-10-CM | POA: Insufficient documentation

## 2021-03-31 DIAGNOSIS — Z79899 Other long term (current) drug therapy: Secondary | ICD-10-CM | POA: Insufficient documentation

## 2021-03-31 DIAGNOSIS — I509 Heart failure, unspecified: Secondary | ICD-10-CM | POA: Diagnosis not present

## 2021-03-31 DIAGNOSIS — Z7901 Long term (current) use of anticoagulants: Secondary | ICD-10-CM | POA: Diagnosis not present

## 2021-03-31 DIAGNOSIS — Z833 Family history of diabetes mellitus: Secondary | ICD-10-CM | POA: Diagnosis not present

## 2021-03-31 DIAGNOSIS — I4892 Unspecified atrial flutter: Secondary | ICD-10-CM | POA: Diagnosis not present

## 2021-03-31 LAB — COMPREHENSIVE METABOLIC PANEL
ALT: 18 U/L (ref 0–44)
AST: 20 U/L (ref 15–41)
Albumin: 4 g/dL (ref 3.5–5.0)
Alkaline Phosphatase: 74 U/L (ref 38–126)
Anion gap: 6 (ref 5–15)
BUN: 15 mg/dL (ref 8–23)
CO2: 26 mmol/L (ref 22–32)
Calcium: 9 mg/dL (ref 8.9–10.3)
Chloride: 108 mmol/L (ref 98–111)
Creatinine, Ser: 1.03 mg/dL (ref 0.61–1.24)
GFR, Estimated: >60 mL/min (ref >60)
Glucose, Bld: 131 mg/dL — ABNORMAL HIGH (ref 70–99)
Potassium: 4.5 mmol/L (ref 3.5–5.1)
Sodium: 140 mmol/L (ref 135–145)
Total Bilirubin: 0.8 mg/dL (ref 0.3–1.2)
Total Protein: 6.4 g/dL — ABNORMAL LOW (ref 6.5–8.1)

## 2021-03-31 LAB — CBC
HCT: 43.9 % (ref 39.0–52.0)
Hemoglobin: 14.3 g/dL (ref 13.0–17.0)
MCH: 30.5 pg (ref 26.0–34.0)
MCHC: 32.6 g/dL (ref 30.0–36.0)
MCV: 93.6 fL (ref 80.0–100.0)
Platelets: 179 10*3/uL (ref 150–400)
RBC: 4.69 MIL/uL (ref 4.22–5.81)
RDW: 13.7 % (ref 11.5–15.5)
WBC: 7.6 10*3/uL (ref 4.0–10.5)
nRBC: 0 % (ref 0.0–0.2)

## 2021-03-31 LAB — T4, FREE: Free T4: 0.97 ng/dL (ref 0.61–1.12)

## 2021-03-31 LAB — TSH: TSH: 2.385 u[IU]/mL (ref 0.350–4.500)

## 2021-03-31 MED ORDER — METOPROLOL SUCCINATE ER 25 MG PO TB24: 25.0000 mg | ORAL_TABLET | Freq: Every day | ORAL | 3 refills | Status: DC

## 2021-03-31 MED ORDER — APIXABAN 5 MG PO TABS: 5.0000 mg | ORAL_TABLET | Freq: Two times a day (BID) | ORAL | 6 refills | Status: DC

## 2021-03-31 NOTE — Progress Notes (Signed)
?Advanced Heart Failure Clinic Note  ? ? ?PCP: Plotnikov, Evie Lacks, MD ? ? ?HPI: ? ?Clifford Alvarez is a 66 y/o male with h/o HTN, DM2 with bicuspid aortic valve with dilated aortic root. Underwent Bentall aortic root replacement with bioprosthetic AVR on 01/10/19 at the [facility]. ? ?Pre-op cardiac CTA. No significant CAD Calcium score 11 ? ?Post-op course relatively uncomplicated. Had moderate pericardial effusion on post-op echo and started on colchicine. Resolved on f/u echo.  ? ?Earlier this month developed severe shoulder and upper back pain radiating down to left arm. Saw NSU. MRI with severe cervical spine disease. Referred to PT.  ? ?Had routine f/u echo yesterday EF read as 35-40% (I thought 45-50%) but on my review of echo was in AF/AFL with RVR so I called him in for a visit. ? ?Says a month ago he felt some dizziness and presyncope. No palpitations. Lasted a couple of weeks then got better. Felt it was related to back pain. Denies exertional dyspnea. 1-2 weeks ago had a couple more episodes. Now feels fine. No sob + snoring. ? ?Echo 03/04/20 EF 60-65%. Normal diastolic function. RV ok. Bioprosthetic AVR ok mean gradient 46mmHG.  ? ? ?Past Medical History:  ?Diagnosis Date  ? Ascending aortic aneurysm   ? 5.4cm ascending aorta by Chest CT 12/2018, s/p thoracic aortic aneurysm repair with Biobentall 83mm Perimount Valve, 74mm graft  ? Bicuspid aortic valve   ? no AS or AI on echo 12/2018  ? CAD (coronary artery disease), native coronary artery   ? minimal CAD of the pLAD and diagonal with 0-24% stenosis  ? CHF (congestive heart failure) ([place])   ? Coronary artery calcification seen on CAT scan   ? calcium score 11 12/2018  ? Diabetes mellitus without complication ([place])   ? Glaucoma   ? Heart murmur   ? History of childhood heart murmur ([place])  ? ? ?Current Outpatient Medications  ?Medication Sig Dispense Refill  ? Alpha-Lipoic Acid 300 MG TABS Take 2 tablets by mouth in the morning and at bedtime.    ?  aspirin EC 81 MG tablet Take 1 tablet (81 mg total) by mouth daily. 90 tablet 3  ? atorvastatin (LIPITOR) 40 MG tablet TAKE 1 TABLET BY MOUTH DAILY AT 6 PM. 90 tablet 3  ? B Complex-C (SUPER B COMPLEX/VITAMIN C) TABS 1 tablet by mouth daily    ? Cholecalciferol (VITAMIN D3) 50 MCG (2000 UT) capsule TAKE 1 CAPSULE BY MOUTH EVERY DAY 90 capsule 3  ? clindamycin (CLEOCIN) 300 MG capsule Take 300 mg by mouth every 6 (six) hours. For dental procedures    ? cyclobenzaprine (FLEXERIL) 10 MG tablet Take 10 mg by mouth 3 (three) times daily.    ? dapagliflozin propanediol (FARXIGA) 5 MG TABS tablet Take 1 tablet (5 mg total) by mouth daily. 90 tablet 3  ? dorzolamide-timolol (COSOPT) 22.3-6.8 MG/ML ophthalmic solution Place 1 drop into both eyes 2 (two) times daily.    ? HYDROcodone-acetaminophen (NORCO) 7.5-325 MG tablet Take 1 tablet by mouth every 6 (six) hours as needed for severe pain. 120 tablet 0  ? Lancets (ONETOUCH DELICA PLUS Q000111Q) MISC USE TO TEST BLOOD SUGAR 3 TIMES DAILY AS INSTRUCTED. 100 each 4  ? latanoprost (XALATAN) 0.005 % ophthalmic solution Place 1 drop into both eyes every evening.    ? losartan (COZAAR) 50 MG tablet Take 1 tablet (50 mg total) by mouth daily. 30 tablet 11  ? metFORMIN (GLUCOPHAGE) 1000 MG tablet TAKE  1 TABLET BY MOUTH TWICE A DAY WITH A MEAL 180 tablet 3  ? ofloxacin (OCUFLOX) 0.3 % ophthalmic solution 1 drop 4 (four) times daily.    ? ONETOUCH VERIO test strip USE AS INSTRUCTED TO CHECK BLOOD SUGAR 2 TIMES A DAY E11.42 100 strip 1  ? prednisoLONE Sodium Phosphate (PREDNISOL OP) Apply 1 Dose to eye in the morning, at noon, in the evening, and at bedtime.    ? ?No current facility-administered medications for this encounter.  ? ? ?Allergies  ?Allergen Reactions  ? Penicillins   ?  As a child  ? ? ?  ?Social History  ? ?Socioeconomic History  ? Marital status: Married  ?  Spouse name: Jackelyn Poling  ? Number of children: 2  ? Years of education: Not on file  ? Highest education level: Not  on file  ?Occupational History  ? 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 Smoking status: Never  ? Smokeless tobacco: Never  ?Substance and Sexual Activity  ? Alcohol use: No  ? Drug use: No  ? Sexual activity: Not on file  ?Other Topics Concern  ? Not on file  ?Social History Narrative  ? Married 38 years  ? Wife - Jackelyn Poling  ? Son 28  ? Daughter 30  ? ?Social Determinants of Health  ? ?Financial Resource Strain: Not on file  ?Food Insecurity: Not on file  ?Transportation Needs: Not on file  ?Physical Activity: Not on file  ?Stress: Not on file  ?Social Connections: Not on file  ?Intimate Partner Violence: Not on file  ? ? ?  ?Family History  ?Problem Relation Age of Onset  ? Diabetes Mother   ? Hypertension Mother   ? Diabetes Father   ? Heart disease Father   ? Diabetes Brother   ? Diabetes Sister   ? ? ?Vitals:  ? 03/31/21 1604 03/31/21 1609  ?BP: (!) 160/88 140/80  ?Pulse: 66   ?SpO2: 98%   ?Weight: 90.5 kg (199 lb 9.6 oz)   ? ? ? ?PHYSICAL EXAM: ?General:  Well appearing. No resp difficulty ?HEENT: normal + plastic globe on left eye from cataracts surgery  ?Neck: supple. no JVD. Carotids 2+ bilat; no bruits. No lymphadenopathy or thryomegaly appreciated. ?Cor: PMI nondisplaced. Irregular rate & rhythm. No rubs, gallops or murmurs. ?Lungs: clear ?Abdomen: soft, nontender, nondistended. No hepatosplenomegaly. No bruits or masses. Good bowel sounds. ?Extremities: no cyanosis, clubbing, rash, edema ?Neuro: alert & orientedx3, cranial nerves grossly intact. moves all 4 extremities w/o difficulty. Affect pleasant ? ?ECG AFL 81 Personally reviewed ? ? ? ?ASSESSMENT & PLAN: ? ?1. New-onset AFL ?- asymptomatic. unknown duration.  ?- currently well rate-controlled but rate was 110-120 on echo yesterday ?- Echo 03/30/21 read as EF 35-40% but I think more like 45-50% with EF hard to assess due to rapid AFL ?- Long discussion about short-term and long-term management ?- Will start  Eliquis 5 bid tomorrow (had cataracts surgery today) ?- CHADVASc  = 3  ?- Start Toprol 25  ?- Plan TEE/DC-CV next week ?- He will home monitor rates with Apple Watch and keep in touch with me ?- Long-term he favors AFL ablation to get off of AC, if possible ?- Zio patch placed today to look for concomitant AF which could change ablation strategy ?- Check home sleep study, TFTs ?- D/w Dr. Quentin Ore ? ?2. Bicuspid aortic valve with dilated aortic root.  ?- s/p Bentall aortic root  replacement with bioprosthetic AVR on 01/10/19 at the [facility] ?- Echo 03/04/20 EF 60-65. Normal diastolic function. RV ok. Bioprosthetic AVR ok mean gradient 72mmHG.  ?- Echo 03/30/21 AoV ok  ?- Aware of SBE prophylaxis ?- Can stop ASA 81 with Eliquis ? ?3. HTN ?- Blood pressure elevated over last week or two.  ?- Adding Toprol. Will follow ? ?4. Type 2 DM ?- controlled HgBA1c 5.8 followed by Dr. Monna Fam ?- On Farxiga and atorva ? ? ?Total time spent 55 minutes. Over half that time spent discussing above.  ? ?Glori Bickers, MD ?03/31/21 ? ?

## 2021-03-31 NOTE — Progress Notes (Signed)
Zio patch placed onto patient.  All instructions and information reviewed with patient, they verbalize understanding with no questions. 

## 2021-03-31 NOTE — H&P (View-Only) (Signed)
?Advanced Heart Failure Clinic Note  ? ? ?PCP: Plotnikov, Evie Lacks, MD ? ? ?HPI: ? ?Clifford Alvarez is a 66 y/o male with h/o HTN, DM2 with bicuspid aortic valve with dilated aortic root. Underwent Bentall aortic root replacement with bioprosthetic AVR on 01/10/19 at the [facility]. ? ?Pre-op cardiac CTA. No significant CAD Calcium score 11 ? ?Post-op course relatively uncomplicated. Had moderate pericardial effusion on post-op echo and started on colchicine. Resolved on f/u echo.  ? ?Earlier this month developed severe shoulder and upper back pain radiating down to left arm. Saw NSU. MRI with severe cervical spine disease. Referred to PT.  ? ?Had routine f/u echo yesterday EF read as 35-40% (I thought 45-50%) but on my review of echo was in AF/AFL with RVR so I called him in for a visit. ? ?Says a month ago he felt some dizziness and presyncope. No palpitations. Lasted a couple of weeks then got better. Felt it was related to back pain. Denies exertional dyspnea. 1-2 weeks ago had a couple more episodes. Now feels fine. No sob + snoring. ? ?Echo 03/04/20 EF 60-65%. Normal diastolic function. RV ok. Bioprosthetic AVR ok mean gradient 55mmHG.  ? ? ?Past Medical History:  ?Diagnosis Date  ? Ascending aortic aneurysm   ? 5.4cm ascending aorta by Chest CT 12/2018, s/p thoracic aortic aneurysm repair with Biobentall 68mm Perimount Valve, 96mm graft  ? Bicuspid aortic valve   ? no AS or AI on echo 12/2018  ? CAD (coronary artery disease), native coronary artery   ? minimal CAD of the pLAD and diagonal with 0-24% stenosis  ? CHF (congestive heart failure) ([place])   ? Coronary artery calcification seen on CAT scan   ? calcium score 11 12/2018  ? Diabetes mellitus without complication ([place])   ? Glaucoma   ? Heart murmur   ? History of childhood heart murmur ([place])  ? ? ?Current Outpatient Medications  ?Medication Sig Dispense Refill  ? Alpha-Lipoic Acid 300 MG TABS Take 2 tablets by mouth in the morning and at bedtime.    ?  aspirin EC 81 MG tablet Take 1 tablet (81 mg total) by mouth daily. 90 tablet 3  ? atorvastatin (LIPITOR) 40 MG tablet TAKE 1 TABLET BY MOUTH DAILY AT 6 PM. 90 tablet 3  ? B Complex-C (SUPER B COMPLEX/VITAMIN C) TABS 1 tablet by mouth daily    ? Cholecalciferol (VITAMIN D3) 50 MCG (2000 UT) capsule TAKE 1 CAPSULE BY MOUTH EVERY DAY 90 capsule 3  ? clindamycin (CLEOCIN) 300 MG capsule Take 300 mg by mouth every 6 (six) hours. For dental procedures    ? cyclobenzaprine (FLEXERIL) 10 MG tablet Take 10 mg by mouth 3 (three) times daily.    ? dapagliflozin propanediol (FARXIGA) 5 MG TABS tablet Take 1 tablet (5 mg total) by mouth daily. 90 tablet 3  ? dorzolamide-timolol (COSOPT) 22.3-6.8 MG/ML ophthalmic solution Place 1 drop into both eyes 2 (two) times daily.    ? HYDROcodone-acetaminophen (NORCO) 7.5-325 MG tablet Take 1 tablet by mouth every 6 (six) hours as needed for severe pain. 120 tablet 0  ? Lancets (ONETOUCH DELICA PLUS Q000111Q) MISC USE TO TEST BLOOD SUGAR 3 TIMES DAILY AS INSTRUCTED. 100 each 4  ? latanoprost (XALATAN) 0.005 % ophthalmic solution Place 1 drop into both eyes every evening.    ? losartan (COZAAR) 50 MG tablet Take 1 tablet (50 mg total) by mouth daily. 30 tablet 11  ? metFORMIN (GLUCOPHAGE) 1000 MG tablet TAKE  1 TABLET BY MOUTH TWICE A DAY WITH A MEAL 180 tablet 3  ? ofloxacin (OCUFLOX) 0.3 % ophthalmic solution 1 drop 4 (four) times daily.    ? ONETOUCH VERIO test strip USE AS INSTRUCTED TO CHECK BLOOD SUGAR 2 TIMES A DAY E11.42 100 strip 1  ? prednisoLONE Sodium Phosphate (PREDNISOL OP) Apply 1 Dose to eye in the morning, at noon, in the evening, and at bedtime.    ? ?No current facility-administered medications for this encounter.  ? ? ?Allergies  ?Allergen Reactions  ? Penicillins   ?  As a child  ? ? ?  ?Social History  ? ?Socioeconomic History  ? Marital status: Married  ?  Spouse name: Jackelyn Poling  ? Number of children: 2  ? Years of education: Not on file  ? Highest education level: Not  on file  ?Occupational History  ? 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 Smoking status: Never  ? Smokeless tobacco: Never  ?Substance and Sexual Activity  ? Alcohol use: No  ? Drug use: No  ? Sexual activity: Not on file  ?Other Topics Concern  ? Not on file  ?Social History Narrative  ? Married 38 years  ? Wife - Jackelyn Poling  ? Son 28  ? Daughter 38  ? ?Social Determinants of Health  ? ?Financial Resource Strain: Not on file  ?Food Insecurity: Not on file  ?Transportation Needs: Not on file  ?Physical Activity: Not on file  ?Stress: Not on file  ?Social Connections: Not on file  ?Intimate Partner Violence: Not on file  ? ? ?  ?Family History  ?Problem Relation Age of Onset  ? Diabetes Mother   ? Hypertension Mother   ? Diabetes Father   ? Heart disease Father   ? Diabetes Brother   ? Diabetes Sister   ? ? ?Vitals:  ? 03/31/21 1604 03/31/21 1609  ?BP: (!) 160/88 140/80  ?Pulse: 66   ?SpO2: 98%   ?Weight: 90.5 kg (199 lb 9.6 oz)   ? ? ? ?PHYSICAL EXAM: ?General:  Well appearing. No resp difficulty ?HEENT: normal + plastic globe on left eye from cataracts surgery  ?Neck: supple. no JVD. Carotids 2+ bilat; no bruits. No lymphadenopathy or thryomegaly appreciated. ?Cor: PMI nondisplaced. Irregular rate & rhythm. No rubs, gallops or murmurs. ?Lungs: clear ?Abdomen: soft, nontender, nondistended. No hepatosplenomegaly. No bruits or masses. Good bowel sounds. ?Extremities: no cyanosis, clubbing, rash, edema ?Neuro: alert & orientedx3, cranial nerves grossly intact. moves all 4 extremities w/o difficulty. Affect pleasant ? ?ECG AFL 81 Personally reviewed ? ? ? ?ASSESSMENT & PLAN: ? ?1. New-onset AFL ?- asymptomatic. unknown duration.  ?- currently well rate-controlled but rate was 110-120 on echo yesterday ?- Echo 03/30/21 read as EF 35-40% but I think more like 45-50% with EF hard to assess due to rapid AFL ?- Long discussion about short-term and long-term management ?- Will start  Eliquis 5 bid tomorrow (had cataracts surgery today) ?- CHADVASc  = 3  ?- Start Toprol 25  ?- Plan TEE/DC-CV next week ?- He will home monitor rates with Apple Watch and keep in touch with me ?- Long-term he favors AFL ablation to get off of AC, if possible ?- Zio patch placed today to look for concomitant AF which could change ablation strategy ?- Check home sleep study, TFTs ?- D/w Dr. Quentin Ore ? ?2. Bicuspid aortic valve with dilated aortic root.  ?- s/p Bentall aortic root  replacement with bioprosthetic AVR on 01/10/19 at the [facility] ?- Echo 03/04/20 EF 60-65. Normal diastolic function. RV ok. Bioprosthetic AVR ok mean gradient 11mmHG.  ?- Echo 03/30/21 AoV ok  ?- Aware of SBE prophylaxis ?- Can stop ASA 81 with Eliquis ? ?3. HTN ?- Blood pressure elevated over last week or two.  ?- Adding Toprol. Will follow ? ?4. Type 2 DM ?- controlled HgBA1c 5.8 followed by Dr. Monna Fam ?- On Farxiga and atorva ? ? ?Total time spent 55 minutes. Over half that time spent discussing above.  ? ?Glori Bickers, MD ?03/31/21 ? ?

## 2021-03-31 NOTE — Patient Instructions (Signed)
START Metoprolol XL 25 mg Daily ? ?START Eliquis 5 mg Twice daily STARTING Friday 3/31 AM ? ?Labs done today, your results will be available in MyChart, we will contact you for abnormal readings. ? ?Your provider has recommended that  you wear a Zio Patch for 14 days.  This monitor will record your heart rhythm for our review.  IF you have any symptoms while wearing the monitor please press the button.  If you have any issues with the patch or you notice a red or orange light on it please call the company at [phone].  Once you remove the patch please mail it back to the company as soon as possible so we can get the results. ? ?Your provider has recommended that you have a home sleep study.  We have provided you with the equipment in our office today. Please download the app and follow the instructions. YOUR PIN NUMBER IS: [id number]. Once you have completed the test you just dispose of the equipment, the information is automatically uploaded to [place] via blue-tooth technology. If your test is positive for sleep apnea and you need a home CPAP machine you will be contacted by Dr Norris Cross office [facility]) to set this up. ? ?Your physician recommends that you schedule a follow-up appointment in: 2 months ? ?If you have any questions or concerns before your next appointment please send [place] a message through [place] or call our office at [phone].   ? ?TO LEAVE A MESSAGE FOR THE NURSE SELECT OPTION 2, PLEASE LEAVE A MESSAGE INCLUDING: ?YOUR NAME ?DATE OF BIRTH ?CALL BACK NUMBER ?REASON FOR CALL**this is important as we prioritize the call backs ? ?YOU WILL RECEIVE A CALL BACK THE SAME DAY AS LONG AS YOU CALL BEFORE 4:00 PM ? ?At the Advanced [facility] Failure Clinic, you and your health needs are our priority. As part of our continuing mission to provide you with exceptional heart care, we have created designated Provider Care Teams. These Care Teams include your primary Cardiologist (physician) and Advanced Practice  Providers (APPs- Physician Assistants and Nurse Practitioners) who all work together to provide you with the care you need, when you need it.  ? ?You may see any of the following providers on your designated Care Team at your next follow up: ?Dr Arvilla Meres ?Dr Marca Ancona ?Tonye Becket, NP ?Robbie Lis, PA ?Jessica Milford,NP ?Anna Genre, PA ?Karle Plumber, PharmD ? ? ?Please be sure to bring in all your medications bottles to every appointment.  ? ? ?

## 2021-04-01 ENCOUNTER — Telehealth (HOSPITAL_COMMUNITY): Payer: Self-pay | Admitting: Pharmacy Technician

## 2021-04-01 ENCOUNTER — Other Ambulatory Visit (HOSPITAL_COMMUNITY): Payer: Self-pay

## 2021-04-01 DIAGNOSIS — I4891 Unspecified atrial fibrillation: Secondary | ICD-10-CM

## 2021-04-01 LAB — T3, FREE: T3, Free: 2.9 pg/mL (ref 2.0–4.4)

## 2021-04-01 NOTE — Progress Notes (Signed)
Height: 6'5"    ?Weight: 199 lb ?BMI: 23.67 ? ?Today's Date: 04/01/21 ? ?STOP BANG RISK ASSESSMENT ?S (snore) Have you been told that you snore?     YES ?  ?T (tired) Are you often tired, fatigued, or sleepy during the day?  ? YES  ?O (obstruction) Do you stop breathing, choke, or gasp during sleep? NO ?  ?P (pressure) Do you have or are you being treated for high blood pressure? YES ?  ?B (BMI) Is your body index greater than 35 kg/m? NO ?  ?A (age) Are you 67 years old or older? YES ?  ?N (neck) Do you have a neck circumference greater than 16 inches?  ?  ?  ?G (gender) Are you a male? YES ?  ?TOTAL STOP/BANG ?YES? ANSWERS 5  ? ?                                                                    For Office Use Only              Procedure Order Form    ?YES to 3+ Stop Bang questions OR two clinical symptoms - patient qualifies for WatchPAT (CPT 95800)     ? ?Clinical Notes: Will consult Sleep Specialist and refer for management of therapy due to patient increased risk of Sleep Apnea. Ordering a sleep study due to the following two clinical symptoms: Excessive daytime sleepiness G47.10 / Loud snoring R06.83  ? ? ? ?

## 2021-04-01 NOTE — Addendum Note (Signed)
Encounter addended by: Noralee Space, RN on: 04/01/2021 8:52 AM ? Actions taken: Clinical Note Signed

## 2021-04-01 NOTE — Telephone Encounter (Signed)
Advanced Heart Failure Patient Advocate Encounter ? ?Patient was started on Eliquis in clinic yesterday. Called to confirm if he had prescription insurance as I found none on eligibility search. Patient stated that he did, but filled the Eliquis with the free trial. I advised him to call back if he had affordability issues when filling with insurance. ? ?Clifford Alvarez, CPhT ? ?

## 2021-04-04 ENCOUNTER — Encounter (HOSPITAL_COMMUNITY): Payer: Medicare Other | Admitting: Internal Medicine

## 2021-04-04 ENCOUNTER — Other Ambulatory Visit: Payer: Self-pay | Admitting: Cardiology

## 2021-04-05 ENCOUNTER — Other Ambulatory Visit: Payer: Self-pay | Admitting: Internal Medicine

## 2021-04-05 ENCOUNTER — Encounter (HOSPITAL_COMMUNITY): Payer: Self-pay | Admitting: Internal Medicine

## 2021-04-05 NOTE — Progress Notes (Signed)
Attempted to obtain medical history via telephone, unable to reach at this time. I left a voicemail to return pre surgical testing department's phone call.  

## 2021-04-05 NOTE — Telephone Encounter (Signed)
This is a CHF pt 

## 2021-04-07 NOTE — Anesthesia Preprocedure Evaluation (Addendum)
Anesthesia Evaluation  ?Patient identified by MRN, date of birth, ID band ?Patient awake ? ? ? ?Reviewed: ?Allergy & Precautions, NPO status , Patient's Chart, lab work & pertinent test results ? ?History of Anesthesia Complications ?Negative for: history of anesthetic complications ? ?Airway ?Mallampati: III ? ?TM Distance: >3 FB ?Neck ROM: Full ? ? ? Dental ?no notable dental hx. ?(+) Dental Advisory Given ?  ?Pulmonary ?neg pulmonary ROS,  ?  ?Pulmonary exam normal ? ? ? ? ? ? ? Cardiovascular ?hypertension, Pt. on home beta blockers ?+CHF  ?+ dysrhythmias Atrial Fibrillation AS and MR  ?Rhythm:Irregular + Systolic murmurs ?? ?IMPRESSIONS ?? ?? ??1. The apex of the heart is hyperdynamic nad the base is hypokinetic. Left ventricular ejection fraction, by estimation, is 35 to 40%. The left ventricle has moderately decreased function. The left ventricle demonstrates global hypokinesis. There is  ?mild left ventricular hypertrophy. Left ventricular diastolic function could not be evaluated. ??2. Left atrial size was mildly dilated. ??3. The mitral valve is abnormal. Mild mitral valve regurgitation. ??4. The aortic valve was not well visualized. Aortic valve regurgitation is not visualized. Mild aortic valve stenosis. Aortic valve area, by VTI measures 1.56 cm?Marland Kitchen Aortic valve mean gradient measures 12.2 mmHg. Aortic valve Vmax measures 2.38 m/s. ??5. The inferior vena cava is normal in size with <50% respiratory variability, suggesting right atrial pressure of 8 mmHg. ??6. Rhythm strip during this exam appears to demonstrate atrial flutter with variable ventricular response. ??7. Right ventricular systolic function is low normal. The right ventricular size is normal. ?? ?Comparison(s): Changes from prior study are noted. 03/04/2020: LVEF 60-65%. ?  ?Neuro/Psych ?negative neurological ROS ?   ? GI/Hepatic ?negative GI ROS, Neg liver ROS,   ?Endo/Other  ?diabetes ? Renal/GU ?negative Renal  ROS  ? ?  ?Musculoskeletal ?negative musculoskeletal ROS ?(+)  ? Abdominal ?  ?Peds ? Hematology ?negative hematology ROS ?(+)   ?Anesthesia Other Findings ? ? Reproductive/Obstetrics ? ?  ? ? ? ? ? ? ? ? ? ? ? ? ? ?  ?  ? ? ? ? ? ? ?Anesthesia Physical ?Anesthesia Plan ? ?ASA: 3 ? ?Anesthesia Plan: MAC  ? ?Post-op Pain Management: Minimal or no pain anticipated  ? ?Induction: Intravenous ? ?PONV Risk Score and Plan: 1 and Propofol infusion ? ?Airway Management Planned: Mask ? ?Additional Equipment:  ? ?Intra-op Plan:  ? ?Post-operative Plan:  ? ?Informed Consent: I have reviewed the patients History and Physical, chart, labs and discussed the procedure including the risks, benefits and alternatives for the proposed anesthesia with the patient or authorized representative who has indicated his/her understanding and acceptance.  ? ? ? ?Dental advisory given ? ?Plan Discussed with: Anesthesiologist and CRNA ? ?Anesthesia Plan Comments:   ? ? ? ? ? ?Anesthesia Quick Evaluation ? ?

## 2021-04-08 ENCOUNTER — Ambulatory Visit (HOSPITAL_COMMUNITY)
Admission: RE | Admit: 2021-04-08 | Discharge: 2021-04-08 | Disposition: A | Discharge status: Home / Self Care | Payer: Medicare Other | Attending: Internal Medicine | Admitting: Internal Medicine

## 2021-04-08 ENCOUNTER — Encounter (HOSPITAL_COMMUNITY)
Admission: RE | Disposition: A | Discharge status: Home / Self Care | Payer: Self-pay | Source: Home / Self Care | Attending: Internal Medicine

## 2021-04-08 ENCOUNTER — Ambulatory Visit (HOSPITAL_COMMUNITY): Payer: Medicare Other | Admitting: Anesthesiology

## 2021-04-08 ENCOUNTER — Telehealth (HOSPITAL_COMMUNITY): Payer: Self-pay | Admitting: Surgery

## 2021-04-08 ENCOUNTER — Ambulatory Visit (HOSPITAL_BASED_OUTPATIENT_CLINIC_OR_DEPARTMENT_OTHER): Payer: Medicare Other | Admitting: Anesthesiology

## 2021-04-08 ENCOUNTER — Ambulatory Visit (HOSPITAL_BASED_OUTPATIENT_CLINIC_OR_DEPARTMENT_OTHER)
Admission: RE | Admit: 2021-04-08 | Discharge: 2021-04-08 | Disposition: A | Discharge status: Home / Self Care | Payer: Medicare Other | Source: Home / Self Care | Attending: Internal Medicine | Admitting: Internal Medicine

## 2021-04-08 ENCOUNTER — Encounter (HOSPITAL_COMMUNITY): Payer: Self-pay | Admitting: Internal Medicine

## 2021-04-08 ENCOUNTER — Other Ambulatory Visit: Payer: Self-pay

## 2021-04-08 DIAGNOSIS — I2510 Atherosclerotic heart disease of native coronary artery without angina pectoris: Secondary | ICD-10-CM | POA: Diagnosis not present

## 2021-04-08 DIAGNOSIS — I4891 Unspecified atrial fibrillation: Secondary | ICD-10-CM | POA: Diagnosis not present

## 2021-04-08 DIAGNOSIS — I7121 Aneurysm of the ascending aorta, without rupture: Secondary | ICD-10-CM | POA: Insufficient documentation

## 2021-04-08 DIAGNOSIS — Z7982 Long term (current) use of aspirin: Secondary | ICD-10-CM | POA: Diagnosis not present

## 2021-04-08 DIAGNOSIS — I110 Hypertensive heart disease with heart failure: Secondary | ICD-10-CM | POA: Diagnosis not present

## 2021-04-08 DIAGNOSIS — Z7901 Long term (current) use of anticoagulants: Secondary | ICD-10-CM | POA: Insufficient documentation

## 2021-04-08 DIAGNOSIS — I509 Heart failure, unspecified: Secondary | ICD-10-CM | POA: Diagnosis not present

## 2021-04-08 DIAGNOSIS — I4892 Unspecified atrial flutter: Secondary | ICD-10-CM | POA: Insufficient documentation

## 2021-04-08 DIAGNOSIS — I5032 Chronic diastolic (congestive) heart failure: Secondary | ICD-10-CM | POA: Diagnosis not present

## 2021-04-08 DIAGNOSIS — Z7984 Long term (current) use of oral hypoglycemic drugs: Secondary | ICD-10-CM | POA: Insufficient documentation

## 2021-04-08 DIAGNOSIS — Z953 Presence of xenogenic heart valve: Secondary | ICD-10-CM | POA: Diagnosis not present

## 2021-04-08 DIAGNOSIS — E119 Type 2 diabetes mellitus without complications: Secondary | ICD-10-CM | POA: Insufficient documentation

## 2021-04-08 DIAGNOSIS — Q231 Congenital insufficiency of aortic valve: Secondary | ICD-10-CM | POA: Insufficient documentation

## 2021-04-08 DIAGNOSIS — I700 Atherosclerosis of aorta: Secondary | ICD-10-CM | POA: Insufficient documentation

## 2021-04-08 DIAGNOSIS — Z79899 Other long term (current) drug therapy: Secondary | ICD-10-CM | POA: Diagnosis not present

## 2021-04-08 HISTORY — PX: TEE WITHOUT CARDIOVERSION: SHX5443

## 2021-04-08 HISTORY — PX: CARDIOVERSION: SHX1299

## 2021-04-08 LAB — GLUCOSE, CAPILLARY: Glucose-Capillary: 142 mg/dL — ABNORMAL HIGH (ref 70–99)

## 2021-04-08 LAB — ECHO TEE: Single Plane A2C EF: 48 %

## 2021-04-08 SURGERY — ECHOCARDIOGRAM, TRANSESOPHAGEAL
Anesthesia: Monitor Anesthesia Care

## 2021-04-08 MED ORDER — SODIUM CHLORIDE 0.9 % IV SOLN: INTRAVENOUS | Status: DC

## 2021-04-08 MED ORDER — PROPOFOL 10 MG/ML IV BOLUS
INTRAVENOUS | Status: DC | PRN
Start: 2021-04-08 — End: 2021-04-08
  Administered 2021-04-08 (×2): 40 mg via INTRAVENOUS
  Administered 2021-04-08 (×2): 20 mg via INTRAVENOUS

## 2021-04-08 MED ORDER — PHENYLEPHRINE 40 MCG/ML (10ML) SYRINGE FOR IV PUSH (FOR BLOOD PRESSURE SUPPORT)
PREFILLED_SYRINGE | INTRAVENOUS | Status: DC | PRN
  Administered 2021-04-08 (×3): 80 ug via INTRAVENOUS

## 2021-04-08 MED ORDER — PROPOFOL 500 MG/50ML IV EMUL
INTRAVENOUS | Status: DC | PRN
  Administered 2021-04-08: 75 ug/kg/min via INTRAVENOUS

## 2021-04-08 NOTE — Progress Notes (Signed)
?  Echocardiogram ?Echocardiogram Transesophageal has been performed. ? ?Clifford Alvarez ?04/08/2021, 8:16 AM ?

## 2021-04-08 NOTE — Interval H&P Note (Signed)
History and Physical Interval Note: ? ?04/08/2021 ?7:52 AM ? ?Clifford Alvarez  has presented today for surgery, with the diagnosis of afib.  The various methods of treatment have been discussed with the patient and family. After consideration of risks, benefits and other options for treatment, the patient has consented to  Procedure(s): ?TRANSESOPHAGEAL ECHOCARDIOGRAM (TEE) (N/A) ?CARDIOVERSION (N/A) as a surgical intervention.  The patient's history has been reviewed, patient examined, no change in status, stable for surgery.  I have reviewed the patient's chart and labs.  Questions were answered to the patient's satisfaction.   ? ? ?[name] ? ? ?

## 2021-04-08 NOTE — Transfer of Care (Signed)
Immediate Anesthesia Transfer of Care Note ? ?Patient: Clifford Alvarez ? ?Procedure(s) Performed: TRANSESOPHAGEAL ECHOCARDIOGRAM (TEE) ?CARDIOVERSION ? ?Patient Location: PACU and Endoscopy Unit ? ?Anesthesia Type:MAC ? ?Level of Consciousness: awake ? ?Airway & Oxygen Therapy: Patient Spontanous Breathing ? ?Post-op Assessment: Report given to RN and Post -op Vital signs reviewed and stable ? ?Post vital signs: Reviewed and stable ? ?Last Vitals:  ?Vitals Value Taken Time  ?BP 106/39 04/08/21 0817  ?Temp 36.7 ?C 04/08/21 0813  ?Pulse 70 04/08/21 0820  ?Resp 22 04/08/21 0820  ?SpO2 93 % 04/08/21 0820  ?Vitals shown include unvalidated device data. ? ?Last Pain:  ?Vitals:  ? 04/08/21 0813  ?TempSrc: Tympanic  ?PainSc: Asleep  ?   ? ?  ? ?Complications: No notable events documented. ?

## 2021-04-08 NOTE — CV Procedure (Signed)
? ?  TRANSESOPHAGEAL ECHOCARDIOGRAM GUIDED DIRECT CURRENT CARDIOVERSION ? ?NAME:  Clifford Alvarez   MRN: [id number] ?DOB:  Jan 10, 1955   ADMIT DATE: 04/08/2021 ? ?INDICATIONS: ? ?Atrial flutter ? ?PROCEDURE:  ? ?Informed consent was obtained prior to the procedure. The risks, benefits and alternatives for the procedure were discussed and the patient comprehended these risks.  Risks include, but are not limited to, cough, sore throat, vomiting, nausea, somnolence, esophageal and stomach trauma or perforation, bleeding, low blood pressure, aspiration, pneumonia, infection, trauma to the teeth and death.   ? ?After a procedural time-out, the oropharynx was anesthetized and the patient was sedated by the anesthesia service. The transesophageal probe was inserted in the esophagus and stomach without difficulty and multiple views were obtained.  ? ?FINDINGS: ? ?LEFT VENTRICLE: EF = 45-50%  ? ?RIGHT VENTRICLE: Mild hypokinesis ? ?LEFT ATRIUM: Moderately dilated ? ?LEFT ATRIAL APPENDAGE: No clot ? ?RIGHT ATRIUM: Moderately dilated ? ?AORTIC VALVE:  Bioprosthetic No AI/AS ? ?MITRAL VALVE:    Normal No MR ? ?TRICUSPID VALVE: Mild TR ? ?PULMONIC VALVE: Grossly normal  ? ?INTERATRIAL SEPTUM: No PFO/ASD ? ?PERICARDIUM: No effusion ? ?DESCENDING AORTA: Mild plaque ? ? ?CARDIOVERSION:    ? ?Indications:  Atrial Flutter ? ?Procedure Details: ? ?Once the TEE was complete, the patient had the defibrillator pads placed in the anterior and posterior position. Once an appropriate level of sedation was achieved, the patient received a single biphasic, synchronized 150J shock with prompt conversion to sinus rhythm. No apparent complications. ? ? ?Glori Bickers, MD  ?8:08 AM ? ? ?  ?

## 2021-04-08 NOTE — Anesthesia Postprocedure Evaluation (Signed)
Anesthesia Post Note ? ?Patient: Clifford Alvarez ? ?Procedure(s) Performed: TRANSESOPHAGEAL ECHOCARDIOGRAM (TEE) ?CARDIOVERSION ? ?  ? ?Patient location during evaluation: Endoscopy ?Anesthesia Type: MAC ?Level of consciousness: awake and alert ?Pain management: pain level controlled ?Vital Signs Assessment: post-procedure vital signs reviewed and stable ?Respiratory status: spontaneous breathing and respiratory function stable ?Cardiovascular status: stable ?Postop Assessment: no apparent nausea or vomiting ?Anesthetic complications: no ? ? ?No notable events documented. ? ?Last Vitals:  ?Vitals:  ? 04/08/21 0823 04/08/21 0833  ?BP: (!) 113/53 103/72  ?Pulse: 69 71  ?Resp: 20 13  ?Temp:    ?SpO2: 95% 96%  ?  ?Last Pain:  ?Vitals:  ? 04/08/21 0833  ?TempSrc:   ?PainSc: 0-No pain  ? ? ?  ?  ?  ?  ?  ?  ? ?Baylee Mccorkel DANIEL ? ? ? ? ?

## 2021-04-08 NOTE — Telephone Encounter (Signed)
Patient called to inform that he can proceed with the ordered home sleep study.  I left a message and asked that he return the call with any questions or concerns. ?

## 2021-04-08 NOTE — Discharge Instructions (Signed)

## 2021-04-18 NOTE — Addendum Note (Signed)
Encounter addended by: Micki Riley, RN on: 04/18/2021 9:32 AM ? Actions taken: Imaging Exam ended

## 2021-04-27 ENCOUNTER — Encounter: Payer: Self-pay | Admitting: Cardiology

## 2021-04-27 ENCOUNTER — Ambulatory Visit (INDEPENDENT_AMBULATORY_CARE_PROVIDER_SITE_OTHER): Payer: Medicare Other | Admitting: Cardiology

## 2021-04-27 VITALS — BP 112/74 | HR 72 | Ht 77.0 in | Wt 196.6 lb

## 2021-04-27 DIAGNOSIS — I483 Typical atrial flutter: Secondary | ICD-10-CM | POA: Diagnosis not present

## 2021-04-27 DIAGNOSIS — Z952 Presence of prosthetic heart valve: Secondary | ICD-10-CM | POA: Diagnosis not present

## 2021-04-27 DIAGNOSIS — I4819 Other persistent atrial fibrillation: Secondary | ICD-10-CM

## 2021-04-27 DIAGNOSIS — Q231 Congenital insufficiency of aortic valve: Secondary | ICD-10-CM

## 2021-04-27 NOTE — Progress Notes (Signed)
?Electrophysiology Office Note:   ? ?Date:  04/27/2021  ? ?ID:  Clifford Alvarez, DOB 1955-10-09, MRN [id number] ? ?PCP:  Plotnikov, Evie Lacks, MD  ?[facility] Cardiologist:  Fransico Him, MD  ?[facility] Electrophysiologist:  None  ? ?Referring MD: Cassandria Anger, MD  ? ?Chief Complaint: Atrial fibrillation ? ?History of Present Illness:   ? ?Clifford Alvarez is a 66 y.o. male who presents for an evaluation of atrial fibrillation at the request of Dr. Haroldine Laws. Their medical history includes hypertension, diabetes, bicuspid aortic valve post Bentall procedure January 10, 2019 including clinic. ? ?He saw Dr. Haroldine Laws on March 31, 2021.  At that appointment he was in new onset atrial flutter that was asymptomatic with ventricular rates in the 110-120 range.  His echo performed while in atrial flutter showed a depressed ejection fraction at 35 to 40%.  Eliquis was started for stroke prophylaxis given a CHA2DS2-VASc of 3.  A Zio patch was placed to check for atrial fibrillation and a TEE cardioversion was arranged. ? ?Today he is with his wife in clinic.  He tells me that when he is in atrial fibrillation he has noticed a slight increase in fatigue with exertion.  Overall though he does not have severe symptoms when he is in atrial fibrillation.  He tells me that he has a planned cervical spine surgery for radiculopathy.  ? ?  ?Past Medical History:  ?Diagnosis Date  ? Ascending aortic aneurysm ([place])   ? 5.4cm ascending aorta by Chest CT 12/2018, s/p thoracic aortic aneurysm repair with Biobentall 65mm Perimount Valve, 60mm graft  ? Bicuspid aortic valve   ? no AS or AI on echo 12/2018  ? CAD (coronary artery disease), native coronary artery   ? minimal CAD of the pLAD and diagonal with 0-24% stenosis  ? CHF (congestive heart failure) ([place])   ? Coronary artery calcification seen on CAT scan   ? calcium score 11 12/2018  ? Diabetes mellitus without complication ([place])   ? Glaucoma   ? Heart murmur   ?  History of childhood heart murmur ([place])  ? ? ?Past Surgical History:  ?Procedure Laterality Date  ? BENTALL PROCEDURE  01/10/2019  ? [facility]  ? CARDIOVERSION N/A 04/08/2021  ? Procedure: CARDIOVERSION;  Surgeon: Jolaine Artist, MD;  Location: [facility] ENDOSCOPY;  Service: Cardiovascular;  Laterality: N/A;  ? COLONOSCOPY  2006  ? Dr. Cristina Gong  ? TEE WITHOUT CARDIOVERSION N/A 04/08/2021  ? Procedure: TRANSESOPHAGEAL ECHOCARDIOGRAM (TEE);  Surgeon: Jolaine Artist, MD;  Location[facility] ENDOSCOPY;  Service: Cardiovascular;  Laterality: N/A;  ? ? ?Current Medications: ?Current Meds  ?Medication Sig  ? Alpha-Lipoic Acid 300 MG TABS Take 600 mg by mouth in the morning and at bedtime.  ? apixaban (ELIQUIS) 5 MG TABS tablet Take 1 tablet (5 mg total) by mouth 2 (two) times daily.  ? atorvastatin (LIPITOR) 40 MG tablet TAKE 1 TABLET BY MOUTH DAILY AT 6 PM.  ? B Complex-C (SUPER B COMPLEX/VITAMIN C) TABS 1 tablet by mouth daily  ? Cholecalciferol (VITAMIN D3) 50 MCG (2000 UT) capsule TAKE 1 CAPSULE BY MOUTH EVERY DAY  ? clindamycin (CLEOCIN) 300 MG capsule Take 600 mg by mouth See admin instructions. 600 mg before and after dental procedures  ? dorzolamide-timolol (COSOPT) 22.3-6.8 MG/ML ophthalmic solution Place 1 drop into both eyes 2 (two) times daily.  ? FARXIGA 10 MG TABS tablet Take 5 mg by mouth daily.  ? Lancets ([place]  LANCET30G) MISC USE TO TEST BLOOD SUGAR 3 TIMES DAILY AS INSTRUCTED.  ? losartan (COZAAR) 50 MG tablet TAKE 1 TABLET BY MOUTH EVERY DAY  ? metFORMIN (GLUCOPHAGE) 1000 MG tablet TAKE 1 TABLET BY MOUTH TWICE A DAY WITH A MEAL  ? ONETOUCH VERIO test strip USE AS INSTRUCTED TO CHECK BLOOD SUGAR 2 TIMES A DAY E11.42  ? [DISCONTINUED] dapagliflozin propanediol (FARXIGA) 5 MG TABS tablet Take 1 tablet (5 mg total) by mouth daily.  ?  ? ?Allergies:   Penicillins  ? ?Social History  ? ?Socioeconomic History  ? Marital status: Married  ?  Spouse name: Clifford Alvarez  ? Number of children: 2  ?  Years of education: Not on file  ? Highest education level: Not on file  ?Occupational History  ? 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 Smoking status: Never  ? Smokeless tobacco: Never  ?Substance and Sexual Activity  ? Alcohol use: No  ? Drug use: No  ? Sexual activity: Not on file  ?Other Topics Concern  ? Not on file  ?Social History Narrative  ? Married 38 years  ? Wife - Clifford Alvarez  ? Son 28  ? Daughter 42  ? ?Social Determinants of Health  ? ?Financial Resource Strain: Not on file  ?Food Insecurity: Not on file  ?Transportation Needs: Not on file  ?Physical Activity: Not on file  ?Stress: Not on file  ?Social Connections: Not on file  ?  ? ?Family History: ?The patient's family history includes Diabetes in his brother, father, mother, and sister; Heart disease in his father; Hypertension in his mother. ? ?ROS:   ?Please see the history of present illness.    ?All other systems reviewed and are negative. ? ?EKGs/Labs/Other Studies Reviewed:   ? ?The following studies were reviewed today: ? ?04/18/2021 Zio personally reviewed ?100% burden of atrial fibrillation/flutter ?Rare ventricular ectopy ?Episodes of atrial fibrillation and atrial flutter noted during monitoring period. ? ? ? ? ?April 08, 2021 transesophageal echo ?EF 45 to 50%, RV function mildly reduced ?Moderately dilated left atrium ?No left atrial appendage thrombus ?Bioprosthetic aortic valve ? ? ? ?Recent Labs: ?03/31/2021: ALT 18; BUN 15; Creatinine, Ser 1.03; Hemoglobin 14.3; Platelets 179; Potassium 4.5; Sodium 140; TSH 2.385  ?Recent Lipid Panel ?   ?Component Value Date/Time  ? CHOL 113 07/09/2019 1040  ? TRIG 102.0 07/09/2019 1040  ? HDL 37.20 (L) 07/09/2019 1040  ? CHOLHDL 3 07/09/2019 1040  ? VLDL 20.4 07/09/2019 1040  ? LDLCALC 56 07/09/2019 1040  ? LDLDIRECT 84.0 11/04/2018 0827  ? ? ?Physical Exam:   ? ?VS:  BP 112/74   Pulse 72   Ht 6\' 5"  (1.956 m)   Wt 196 lb 9.6 oz (89.2 kg)   SpO2 96%   BMI  23.31 kg/m?    ? ?Wt Readings from Last 3 Encounters:  ?04/27/21 196 lb 9.6 oz (89.2 kg)  ?04/08/21 195 lb (88.5 kg)  ?03/31/21 199 lb 9.6 oz (90.5 kg)  ?  ? ?GEN: Well nourished, well developed in no acute distress ?HEENT: Normal ?NECK: No JVD; No carotid bruits ?LYMPHATICS: No lymphadenopathy ?CARDIAC: RRR, no murmurs, rubs, gallops ?RESPIRATORY:  Clear to auscultation without rales, wheezing or rhonchi  ?ABDOMEN: Soft, non-tender, non-distended ?MUSCULOSKELETAL:  No edema; No deformity  ?SKIN: Warm and dry ?NEUROLOGIC:  Alert and oriented x 3 ?PSYCHIATRIC:  Normal affect  ? ? ?  ? ?ASSESSMENT:   ? ?1. Persistent  atrial fibrillation ([place])   ?2. Typical atrial flutter ([place])   ?3. S/P AVR (aortic valve replacement)   ?4. Bicuspid aortic valve   ? ?PLAN:   ? ?In order of problems listed above: ? ?#Persistent atrial fibrillation and flutter ?Mild symptoms from his atrial fibrillation including dyspnea with exertion and slight decreased exercise tolerance.  He does have a depressed ejection fraction in the setting of rapidly conducted A-fib/flutter.  Rhythm control is indicated.  I discussed treatment options for the patient including antiarrhythmic drugs and catheter ablation.  I specifically discussed amiodarone and dofetilide.  I discussed the catheter ablation procedure in detail including the risks, recovery and likelihood of success.  I discussed the possibility of needing a second ablation procedures or antiarrhythmic drugs after an initial ablation.  I discussed the need for Eliquis given his elevated CHA2DS2-VASc.  He has an upcoming cervical spine surgery for radiculopathy.  He is planning to get that taken care of first and then address his atrial fibrillation with ablation later in the summer which I think is very reasonable. ? ?Risk, benefits, and alternatives to EP study and radiofrequency ablation for afib were also discussed in detail today. These risks include but are not limited to stroke, bleeding,  vascular damage, tamponade, perforation, damage to the esophagus, lungs, and other structures, pulmonary vein stenosis, worsening renal function, and death. The patient understands these risk and wishes to pro

## 2021-04-27 NOTE — Patient Instructions (Addendum)
Medication Instructions:  ?Your physician recommends that you continue on your current medications as directed. Please refer to the Current Medication list given to you today. ?*If you need a refill on your cardiac medications before your next appointment, please call your pharmacy* ? ?Lab Work: ?None. ?If you have labs (blood work) drawn today and your tests are completely normal, you will receive your results only by: ?MyChart Message (if you have MyChart) OR ?A paper copy in the mail ?If you have any lab test that is abnormal or we need to change your treatment, we will call you to review the results. ? ?Testing/Procedures: ?Your physician has recommended that you have an ablation. Catheter ablation is a medical procedure used to treat some cardiac arrhythmias (irregular heartbeats). During catheter ablation, a long, thin, flexible tube is put into a blood vessel in your groin (upper thigh), or neck. This tube is called an ablation catheter. It is then guided to your heart through the blood vessel. Radio frequency waves destroy small areas of heart tissue where abnormal heartbeats may cause an arrhythmia to start. Please see the instruction sheet given to you today. ? ? ?Follow-Up: ?At [facility], you and your health needs are our priority.  As part of our continuing mission to provide you with exceptional heart care, we have created designated Provider Care Teams.  These Care Teams include your primary Cardiologist (physician) and Advanced Practice Providers (APPs -  Physician Assistants and Nurse Practitioners) who all work together to provide you with the care you need, when you need it. ? ?Your physician wants you to follow-up in: Afib Ablation dates: (subject to change)  ?Aug- 10, 14, 15, 21,24, 28, 29. Call Otila Kluver RN when you decide on a date or mychart message.  ? ?We recommend signing up for the patient portal called "MyChart".  Sign up information is provided on this After Visit Summary.  MyChart is  used to connect with patients for Virtual Visits (Telemedicine).  Patients are able to view lab/test results, encounter notes, upcoming appointments, etc.  Non-urgent messages can be sent to your provider as well.   ?To learn more about what you can do with MyChart, go to [email or web address].   ? ?Any Other Special Instructions Will Be Listed Below (If Applicable). ? ?Cardiac Ablation ?Cardiac ablation is a procedure to destroy (ablate) some heart tissue that is sending bad signals. These bad signals cause problems in heart rhythm. ?The heart has many areas that make these signals. If there are problems in these areas, they can make the heart beat in a way that is not normal. Destroying some tissues can help make the heart rhythm normal. ?Tell your doctor about: ?Any allergies you have. ?All medicines you are taking. These include vitamins, herbs, eye drops, creams, and over-the-counter medicines. ?Any problems you or family members have had with medicines that make you fall asleep (anesthetics). ?Any blood disorders you have. ?Any surgeries you have had. ?Any medical conditions you have, such as kidney failure. ?Whether you are pregnant or may be pregnant. ?What are the risks? ?This is a safe procedure. But problems may occur, including: ?Infection. ?Bruising and bleeding. ?Bleeding into the chest. ?Stroke or blood clots. ?Damage to nearby areas of your body. ?Allergies to medicines or dyes. ?The need for a pacemaker if the normal system is damaged. ?Failure of the procedure to treat the problem. ?What happens before the procedure? ?Medicines ?Ask your doctor about: ?Changing or stopping your normal medicines. This is important. ?Taking aspirin  and ibuprofen. Do not take these medicines unless your doctor tells you to take them. ?Taking other medicines, vitamins, herbs, and supplements. ?General instructions ?Follow instructions from your doctor about what you cannot eat or drink. ?Plan to have someone take you  home from the hospital or clinic. ?If you will be going home right after the procedure, plan to have someone with you for 24 hours. ?Ask your doctor what steps will be taken to prevent infection. ?What happens during the procedure? ? ?An IV tube will be put into one of your veins. ?You will be given a medicine to help you relax. ?The skin on your neck or groin will be numbed. ?A cut (incision) will be made in your neck or groin. A needle will be put through your cut and into a large vein. ?A tube (catheter) will be put into the needle. The tube will be moved to your heart. ?Dye may be put through the tube. This helps your doctor see your heart. ?Small devices (electrodes) on the tube will send out signals. ?A type of energy will be used to destroy some heart tissue. ?The tube will be taken out. ?Pressure will be held on your cut. This helps stop bleeding. ?A bandage will be put over your cut. ?The exact procedure may vary among doctors and hospitals. ?What happens after the procedure? ?You will be watched until you leave the hospital or clinic. This includes checking your heart rate, breathing rate, oxygen, and blood pressure. ?Your cut will be watched for bleeding. You will need to lie still for a few hours. ?Do not drive for 24 hours or as long as your doctor tells you. ?Summary ?Cardiac ablation is a procedure to destroy some heart tissue. This is done to treat heart rhythm problems. ?Tell your doctor about any medical conditions you may have. Tell him or her about all medicines you are taking to treat them. ?This is a safe procedure. But problems may occur. These include infection, bruising, bleeding, and damage to nearby areas of your body. ?Follow what your doctor tells you about food and drink. You may also be told to change or stop some of your medicines. ?After the procedure, do not drive for 24 hours or as long as your doctor tells you. ?This information is not intended to replace advice given to you by  your health care provider. Make sure you discuss any questions you have with your health care provider. ?Document Revised: 11/21/2018 Document Reviewed: 11/21/2018 ?Elsevier Patient Education ? [place]. ? ? ? ? ?  ? ? ?

## 2021-05-06 ENCOUNTER — Other Ambulatory Visit: Payer: Self-pay | Admitting: Neurosurgery

## 2021-05-10 ENCOUNTER — Telehealth: Payer: Self-pay | Admitting: *Deleted

## 2021-05-10 ENCOUNTER — Encounter (INDEPENDENT_AMBULATORY_CARE_PROVIDER_SITE_OTHER): Payer: Medicare Other | Admitting: Cardiology

## 2021-05-10 DIAGNOSIS — G4733 Obstructive sleep apnea (adult) (pediatric): Secondary | ICD-10-CM | POA: Diagnosis not present

## 2021-05-10 NOTE — Telephone Encounter (Signed)
Ablation date June 30, lab day June 8. Will call patient and go over instructions once work up is completed.  ?

## 2021-05-10 NOTE — Telephone Encounter (Signed)
Left message to call back  

## 2021-05-10 NOTE — Telephone Encounter (Signed)
Pt returning nurse's call. Please advise

## 2021-05-11 ENCOUNTER — Ambulatory Visit: Payer: Medicare Other

## 2021-05-11 DIAGNOSIS — I480 Paroxysmal atrial fibrillation: Secondary | ICD-10-CM

## 2021-05-11 NOTE — Procedures (Signed)
? ?  SLEEP STUDY REPORT ?Patient Information ?Study Date: 05/10/21 ?Patient Name: Clifford Alvarez ?Patient ID: [id number] ?Birth Date: 08-May-2055 ?Age: 66 ?Gender: Male ?Referring Physician: Arvilla Meres, MD ? ?TEST DESCRIPTION: Home sleep apnea testing was completed using the WatchPat, a Type 1 device, utilizing  ?peripheral arterial tonometry (PAT), chest movement, actigraphy, pulse oximetry, pulse rate, body position and snore.  ?AHI was calculated with apnea and hypopnea using valid sleep time as the denominator. RDI includes apneas,  ?hypopneas, and RERAs. The data acquired and the scoring of sleep and all associated events were performed in  ?accordance with the recommended standards and specifications as outlined in the AASM Manual for the Scoring of  ?Sleep and Associated Events 2.2.0 (2015). ? ?FINDINGS: ?1. Severe Obstructive Sleep Apnea with AHI 44.3/hr.  ?2. Moderate Central Sleep Apnea with pAHIc 24/hr. ?3. Oxygen desaturations as low as 77%. ?4. Mild snoring was present. O2 sats were < 88% for 16.7 min. ?5. Total sleep time was 6 hrs and 17 min. ?6. 20.9% of total sleep time was spent in REM sleep.  ?7. Prolonged sleep onset latency at 26 min.  ?8. Shortened REM sleep onset latency at 25 min.  ?9. Total awakenings were 7.  ? ?DIAGNOSIS:  ?Severe Obstructive Sleep Apnea (G47.33) ?Moderate Central Sleep apnea ?Nocturnal Hypoxemia ? ?RECOMMENDATIONS: ?1. Clinical correlation of these findings is necessary. The decision to treat obstructive sleep apnea (OSA) is usually  ?based on the presence of apnea symptoms or the presence of associated medical conditions such as Hypertension,  ?Congestive Heart Failure, Atrial Fibrillation or Obesity. The most common symptoms of OSA are snoring, gasping for  ?breath while sleeping, daytime sleepiness and fatigue.  ? ?2. Initiating apnea therapy is recommended given the presence of symptoms and/or associated conditions.  ?Recommend proceeding with one of the  following: ? ? a. Auto-CPAP therapy with a pressure range of 5-20cm H2O. ? ? b. An oral appliance (OA) that can be obtained from certain dentists with expertise in sleep medicine. These are  ?primarily of use in non-obese patients with mild and moderate disease. ? ? c. An ENT consultation which may be useful to look for specific causes of obstruction and possible treatment  ?options. ? ? d. If patient is intolerant to PAP therapy, consider referral to ENT for evaluation for hypoglossal nerve stimulator.  ? ?3. Close follow-up is necessary to ensure success with CPAP or oral appliance therapy for maximum benefit . ? ?4. A follow-up oximetry study on CPAP is recommended to assess the adequacy of therapy and determine the need  ?for supplemental oxygen or the potential need for Bi-level therapy. An arterial blood gas to determine the adequacy of  ?baseline ventilation and oxygenation should also be considered. ? ?5. Healthy sleep recommendations include: adequate nightly sleep (normal 7-9 hrs/night), avoidance of caffeine after  ?noon and alcohol near bedtime, and maintaining a sleep environment that is cool, dark and quiet. ? ?6. Weight loss for overweight patients is recommended. Even modest amounts of weight loss can significantly  ?improve the severity of sleep apnea. ? ?7. Snoring recommendations include: weight loss where appropriate, side sleeping, and avoidance of alcohol before  ?bed. ? ?8. Operation of motor vehicle should be avoided when sleepy. ? ?Signature: ?Electronically Signed: 05/11/21 ?Armanda Magic, MD; [facility]; Diplomat, American Board of  Sleep Medicine ? ?

## 2021-05-11 NOTE — Progress Notes (Signed)
Surgical Instructions ? ? ? Your procedure is scheduled on Friday May 12. ? Report to [facility] Main Entrance "A" at 5:30 A.M., then check in with the Admitting office. ? Call this number if you have problems the morning of surgery: ? [phone] ? ? If you have any questions prior to your surgery date call [phone]: Open Monday-Friday 8am-4pm ? ? ? Remember: ? Do not eat after midnight the night before your surgery ? ?You may drink clear liquids until 4:30am the morning of your surgery.   ?Clear liquids allowed are: Water, Non-Citrus Juices (without pulp), Carbonated Beverages, Clear Tea, Black Coffee ONLY (NO MILK, CREAM OR POWDERED CREAMER of any kind), and Gatorade ?  ? Take these medicines the morning of surgery with A SIP OF WATER:  ?dorzolamide-timolol (COSOPT) ? ?DO NOT TAKE  FARXIGA THE DAY BEFORE SURGERY AND THE DAY  OF SURGERY. ? ?As of today, STOP taking any Aspirin (unless otherwise instructed by your surgeon) Aleve, Naproxen, Ibuprofen, Motrin, Advil, Goody's, BC's, all herbal medications, fish oil, and all vitamins. ? ? ?WHAT DO I DO ABOUT MY DIABETES MEDICATION? ? ?Do not take oral diabetes medicines (pills) the morning of surgery. DO NOT TAKE metFORMIN (GLUCOPHAGE) THE MORNING OF SURGERY ? ? ?HOW TO MANAGE YOUR DIABETES ?BEFORE AND AFTER SURGERY ? ?Why is it important to control my blood sugar before and after surgery? ?Improving blood sugar levels before and after surgery helps healing and can limit problems. ?A way of improving blood sugar control is eating a healthy diet by: ? Eating less sugar and carbohydrates ? Increasing activity/exercise ? Talking with your doctor about reaching your blood sugar goals ?High blood sugars (greater than 180 mg/dL) can raise your risk of infections and slow your recovery, so you will need to focus on controlling your diabetes during the weeks before surgery. ?Make sure that the doctor who takes care of your diabetes knows about your planned surgery  including the date and location. ? ?How do I manage my blood sugar before surgery? ?Check your blood sugar at least 4 times a day, starting 2 days before surgery, to make sure that the level is not too high or low. ? ?Check your blood sugar the morning of your surgery when you wake up and every 2 hours until you get to the Short Stay unit. ? ?If your blood sugar is less than 70 mg/dL, you will need to treat for low blood sugar: ?Do not take insulin. ?Treat a low blood sugar (less than 70 mg/dL) with ? cup of clear juice (cranberry or apple), 4 glucose tablets, OR glucose gel. ?Recheck blood sugar in 15 minutes after treatment (to make sure it is greater than 70 mg/dL). If your blood sugar is not greater than 70 mg/dL on recheck, call [phone] for further instructions. ?Report your blood sugar to the short stay nurse when you get to Short Stay. ? ?If you are admitted to the hospital after surgery: ?Your blood sugar will be checked by the staff and you will probably be given insulin after surgery (instead of oral diabetes medicines) to make sure you have good blood sugar levels. ?The goal for blood sugar control after surgery is 80-180 mg/dL.         ?Do not wear jewelry or makeup ?Do not wear lotions, powders, perfumes/colognes, or deodorant. ?Do not shave 48 hours prior to surgery.  Men may shave face and neck. ?Do not bring valuables to the hospital. ?Do not wear nail polish,  gel polish, artificial nails, or any other type of covering on natural nails (fingers and toes) ?If you have artificial nails or gel coating that need to be removed by a nail salon, please have this removed prior to surgery. Artificial nails or gel coating may interfere with anesthesia's ability to adequately monitor your vital signs. ? ?[facility] is not responsible for any belongings or valuables. .  ? ?Do NOT Smoke (Tobacco/Vaping)  24 hours prior to your procedure ? ?If you use a CPAP at night, you may bring your mask for your  overnight stay. ?  ?Contacts, glasses, hearing aids, dentures or partials may not be worn into surgery, please bring cases for these belongings ?  ?For patients admitted to the hospital, discharge time will be determined by your treatment team. ?  ?Patients discharged the day of surgery will not be allowed to drive home, and someone needs to stay with them for 24 hours. ? ? ?SURGICAL WAITING ROOM VISITATION ?Patients having surgery or a procedure in a hospital may have two support people. ?Children under the age of 53 must have an adult with them who is not the patient. ?They may stay in the waiting area during the procedure and may switch out with other visitors. If the patient needs to stay at the hospital during part of their recovery, the visitor guidelines for inpatient rooms apply. ? ?Please refer to the [facility] website for the visitor guidelines for Inpatients (after your surgery is over and you are in a regular room).  ? ? ? ? ? ?Special instructions:   ? ?Oral Hygiene is also important to reduce your risk of infection.  Remember - BRUSH YOUR TEETH THE MORNING OF SURGERY WITH YOUR REGULAR TOOTHPASTE ? ? ?[facility]- Preparing For Surgery ? ?Before surgery, you can play an important role. Because skin is not sterile, your skin needs to be as free of germs as possible. You can reduce the number of germs on your skin by washing with CHG (chlorahexidine gluconate) Soap before surgery.  CHG is an antiseptic cleaner which kills germs and bonds with the skin to continue killing germs even after washing.   ? ? ?Please do not use if you have an allergy to CHG or antibacterial soaps. If your skin becomes reddened/irritated stop using the CHG.  ?Do not shave (including legs and underarms) for at least 48 hours prior to first CHG shower. It is OK to shave your face. ? ?Please follow these instructions carefully. ?  ? ? Shower the NIGHT BEFORE SURGERY and the MORNING OF SURGERY with CHG Soap.  ? If you chose to wash  your hair, wash your hair first as usual with your normal shampoo. After you shampoo, rinse your hair and body thoroughly to remove the shampoo.  Then [employer] and genitals (private parts) with your normal soap and rinse thoroughly to remove soap. ? ?After that Use CHG Soap as you would any other liquid soap. You can apply CHG directly to the skin and wash gently with a scrungie or a clean washcloth.  ? ?Apply the CHG Soap to your body ONLY FROM THE NECK DOWN.  Do not use on open wounds or open sores. Avoid contact with your eyes, ears, mouth and genitals (private parts). Wash Face and genitals (private parts)  with your normal soap.  ? ?Wash thoroughly, paying special attention to the area where your surgery will be performed. ? ?Thoroughly rinse your body with warm water from the neck  down. ? ?DO NOT shower/wash with your normal soap after using and rinsing off the CHG Soap. ? ?Pat yourself dry with a CLEAN TOWEL. ? ?Wear CLEAN PAJAMAS to bed the night before surgery ? ?Place CLEAN SHEETS on your bed the night before your surgery ? ?DO NOT SLEEP WITH PETS. ? ? ?Day of Surgery: ? ?Take a shower with CHG soap. ?Wear Clean/Comfortable clothing the morning of surgery ?Do not apply any deodorants/lotions.   ?Remember to brush your teeth WITH YOUR REGULAR TOOTHPASTE. ? ? ? ?If you received a COVID test during your pre-op visit, it is requested that you wear a mask when out in public, stay away from anyone that may not be feeling well, and notify your surgeon if you develop symptoms. If you have been in contact with anyone that has tested positive in the last 10 days, please notify your surgeon. ? ?  ?Please read over the following fact sheets that you were given.   ?

## 2021-05-12 ENCOUNTER — Other Ambulatory Visit: Payer: Self-pay

## 2021-05-12 ENCOUNTER — Encounter (HOSPITAL_COMMUNITY): Payer: Self-pay

## 2021-05-12 ENCOUNTER — Encounter (HOSPITAL_COMMUNITY)
Admission: RE | Admit: 2021-05-12 | Discharge: 2021-05-12 | Disposition: A | Discharge status: Home / Self Care | Payer: Medicare Other | Source: Ambulatory Visit | Attending: Neurosurgery | Admitting: Neurosurgery

## 2021-05-12 VITALS — BP 108/62 | HR 62 | Temp 98.0°F | Resp 17 | Ht 77.0 in | Wt 197.2 lb

## 2021-05-12 DIAGNOSIS — I110 Hypertensive heart disease with heart failure: Secondary | ICD-10-CM | POA: Insufficient documentation

## 2021-05-12 DIAGNOSIS — E1142 Type 2 diabetes mellitus with diabetic polyneuropathy: Secondary | ICD-10-CM

## 2021-05-12 DIAGNOSIS — I4891 Unspecified atrial fibrillation: Secondary | ICD-10-CM | POA: Insufficient documentation

## 2021-05-12 DIAGNOSIS — I10 Essential (primary) hypertension: Secondary | ICD-10-CM

## 2021-05-12 DIAGNOSIS — Z01812 Encounter for preprocedural laboratory examination: Secondary | ICD-10-CM | POA: Diagnosis not present

## 2021-05-12 DIAGNOSIS — I509 Heart failure, unspecified: Secondary | ICD-10-CM | POA: Insufficient documentation

## 2021-05-12 DIAGNOSIS — M4802 Spinal stenosis, cervical region: Secondary | ICD-10-CM | POA: Diagnosis not present

## 2021-05-12 DIAGNOSIS — H409 Unspecified glaucoma: Secondary | ICD-10-CM | POA: Insufficient documentation

## 2021-05-12 DIAGNOSIS — Z7984 Long term (current) use of oral hypoglycemic drugs: Secondary | ICD-10-CM | POA: Insufficient documentation

## 2021-05-12 DIAGNOSIS — Z01818 Encounter for other preprocedural examination: Secondary | ICD-10-CM

## 2021-05-12 HISTORY — DX: Cardiac arrhythmia, unspecified: I49.9

## 2021-05-12 LAB — CBC
HCT: 44.1 % (ref 39.0–52.0)
Hemoglobin: 15 g/dL (ref 13.0–17.0)
MCH: 31.5 pg (ref 26.0–34.0)
MCHC: 34 g/dL (ref 30.0–36.0)
MCV: 92.6 fL (ref 80.0–100.0)
Platelets: 166 10*3/uL (ref 150–400)
RBC: 4.76 MIL/uL (ref 4.22–5.81)
RDW: 13 % (ref 11.5–15.5)
WBC: 8.2 10*3/uL (ref 4.0–10.5)
nRBC: 0 % (ref 0.0–0.2)

## 2021-05-12 LAB — HEMOGLOBIN A1C
Hgb A1c MFr Bld: 6.1 % — ABNORMAL HIGH (ref 4.8–5.6)
Mean Plasma Glucose: 128.37 mg/dL

## 2021-05-12 LAB — BASIC METABOLIC PANEL
Anion gap: 8 (ref 5–15)
BUN: 20 mg/dL (ref 8–23)
CO2: 27 mmol/L (ref 22–32)
Calcium: 9.1 mg/dL (ref 8.9–10.3)
Chloride: 104 mmol/L (ref 98–111)
Creatinine, Ser: 1.05 mg/dL (ref 0.61–1.24)
GFR, Estimated: >60 mL/min (ref >60)
Glucose, Bld: 123 mg/dL — ABNORMAL HIGH (ref 70–99)
Potassium: 4.9 mmol/L (ref 3.5–5.1)
Sodium: 139 mmol/L (ref 135–145)

## 2021-05-12 LAB — GLUCOSE, CAPILLARY: Glucose-Capillary: 123 mg/dL — ABNORMAL HIGH (ref 70–99)

## 2021-05-12 NOTE — Progress Notes (Signed)
Anesthesia Chart Review: ? Case: [id number] Date/Time: 05/13/21 0715  ? Procedure: ACDF - C5-C6 - C6-C7  ? Anesthesia type: General  ? Pre-op diagnosis: Stenosis  ? Location: [facility] OR ROOM 20 / [facility] OR  ? Surgeons: Kary Kos, MD  ? ?  ? ? ?DISCUSSION: Patient is a 66 year old male scheduled for the above procedure. ? ?History includes never smoker, DM2, glaucoma, bicuspid AV with ascending TAA (s/p Bental procedure with 25 mm Perimount tissue Valve, 30 mm graft 01/10/19 at the [facility]), CHF, afib (s/p DCCV 04/07/21).  ? ?Last cardiology follow-up was with EP Dr. Quentin Ore to discuss afib ablation. He had new onset aflutter with variable rates noted during routine 03/30/21 echo. EF 35-40% which was down from 60-65% 03/04/20. Had some dizziness a month prior, but no palpitations or exertional dyspnea. CHA2DS2-VASc of 3, and he was started on Eliquis. Ziopatch showed persistent afib. He underwent TEE with DCCV 04/08/21. Has Apple Watch to monitor rates. Long term Mr. Blumenschein preferred to consider afib/flutter ablation to get off long-term anticoagulation, so he was referred to EP. At visit with Dr. Quentin Ore he reported a slight increase in fatigue with exertion when in afib, but no significant symptoms. He told Dr. Quentin Ore about plans for c-spine surgery for radiculopathy and preferred to postpone ablation until after surgery since he anticoagulation cannot be stopped for several months following ablation. Dr. Quentin Ore wrote, " He has an upcoming cervical spine surgery for radiculopathy.  He is planning to get that taken care of first and then address his atrial fibrillation with ablation later in the summer which I think is very reasonable." He is now > 4 weeks post DCCV. Patient reported Eliquis on hold since 05/06/21. ? ?05/12/21 A1c is still pending but was 6.0% on 12/13/20.  He reported home fasting CBGs around 115-135. ? ?Anesthesia team to evaluate on the day of surgery. ? ?VS: BP 108/62   Pulse 62   Temp 36.7 ?C (Oral)    Resp 17   Ht 6\' 5"  (1.956 m)   Wt 89.4 kg   SpO2 99%   BMI 23.38 kg/m?  ? ? ?PROVIDERS: ?Plotnikov, Evie Lacks, MD is PCP  ?Lars Mage, MD is EP cardiologist ?Glori Bickers, MD is HF cardiologist ? ? ?LABS: Labs reviewed: Acceptable for surgery. A1c is still in process. ?(all labs ordered are listed, but only abnormal results are displayed) ? ?Labs Reviewed  ?GLUCOSE, CAPILLARY - Abnormal; Notable for the following components:  ?    Result Value  ? Glucose-Capillary 123 (*)   ? All other components within normal limits  ?BASIC METABOLIC PANEL - Abnormal; Notable for the following components:  ? Glucose, Bld 123 (*)   ? All other components within normal limits  ?SURGICAL PCR SCREEN  ?CBC  ?HEMOGLOBIN A1C  ? ? ?Sleep Study 05/10/21: ?FINDINGS: ?1. Severe Obstructive Sleep Apnea with AHI 44.3/hr.  ?2. Moderate Central Sleep Apnea with pAHIc 24/hr. ?3. Oxygen desaturations as low as 77%. ?4. Mild snoring was present. O2 sats were < 88% for 16.7 min. ?5. Total sleep time was 6 hrs and 17 min. ?6. 20.9% of total sleep time was spent in REM sleep.  ?7. Prolonged sleep onset latency at 26 min.  ?8. Shortened REM sleep onset latency at 25 min.  ?9. Total awakenings were 7.  ?DIAGNOSIS:  ?Severe Obstructive Sleep Apnea (G47.33) ?Moderate Central Sleep apnea ?Nocturnal Hypoxemia ?  ? ?IMAGES: ?MRI C-spine 03/03/21: ?IMPRESSION: ?1. Multilevel degenerative changes throughout the cervical spine as ?  detailed above. Findings are most advanced at C3-C4 where there is ?moderate spinal canal stenosis and severe bilateral neural foraminal ?stenosis. ?2. Moderate to severe bilateral neural foraminal stenosis at C4-C5. ?3. Mild-to-moderate spinal canal stenosis with slight indentation of ?the right ventral aspect of the cord at C5-C6. ?4. Severe right and moderate left neural foraminal stenosis at ?C6-C7. ? ? ?EKG: 04/08/21: ?Sinus rhythm with first degree AV block ?Lateral ischemia ?When compared with ECG of 31-Mar-2021  16:13, ?Sinus rhythm has replaced Atrial fibrillation ?Confirmed by Rudean Haskell [id number]) on 04/08/2021 1:29:06 PM ? ? ?CV: ?TEE with DCCV 04/08/21: ?IMPRESSIONS  ? 1. Left ventricular ejection fraction, by estimation, is 45 to 50%. The  ?left ventricle has mildly decreased function. The left ventricle has no  ?regional wall motion abnormalities.  ? 2. Right ventricular systolic function is mildly reduced. The right  ?ventricular size is normal.  ? 3. Left atrial size was moderately dilated. No left atrial/left atrial  ?appendage thrombus was detected.  ? 4. Right atrial size was moderately dilated.  ? 5. The mitral valve is normal in structure. Trivial mitral valve  ?regurgitation. No evidence of mitral stenosis.  ? 6. The aortic valve has been repaired/replaced. Aortic valve  ?regurgitation is not visualized. No aortic stenosis is present. There is a  ?unknown bioprosthetic valve present in the aortic position.  ? 7. There is mild (Grade II) plaque involving the descending aorta.  ? 8. The inferior vena cava is normal in size with greater than 50%  ?respiratory variability, suggesting right atrial pressure of 3 mmHg.  ?- Conclusion(s)/Recommendation(s): Normal biventricular function without  ?evidence of hemodynamically significant valvular heart disease.  ? ? ?Long term event monitor 03/31/21-04/04/21: ?Patch Wear Time:  3 days and 23 hours (2023-03-30T16:23:45-0400 to 2023-04-03T16:14:53-0400) ?1. Atrial Fibrillation occurred continuously (100% burden), ranging from 42-130 bpm (avg of 73 bpm). ?2. Rare PVCs ?  ? ?Cardiac cath 01/08/19 ([place]): ?Impression:Left dominant circulation. No angiographic evidence of coronary artery  ?disease.  ? ? ?Past Medical History:  ?Diagnosis Date  ? Ascending aortic aneurysm ([place])   ? 5.4cm ascending aorta by Chest CT 12/2018, s/p thoracic aortic aneurysm repair with Biobentall 24mm Perimount Valve, 39mm graft  ? Bicuspid aortic valve   ? no AS or AI on echo 12/2018   ? CAD (coronary artery disease), native coronary artery   ? minimal CAD of the pLAD and diagonal with 0-24% stenosis  ? CHF (congestive heart failure) ([place])   ? Coronary artery calcification seen on CAT scan   ? calcium score 11 12/2018  ? Diabetes mellitus without complication ([place])   ? Dysrhythmia   ? Glaucoma   ? Heart murmur   ? History of childhood heart murmur (Aortic)  ? ? ?Past Surgical History:  ?Procedure Laterality Date  ? BENTALL PROCEDURE  01/10/2019  ? at [facility]  ? CARDIOVERSION N/A 04/08/2021  ? Procedure: CARDIOVERSION;  Surgeon: Jolaine Artist, MD;  Location: [facility] ENDOSCOPY;  Service: Cardiovascular;  Laterality: N/A;  ? COLONOSCOPY  2006  ? Dr. Cristina Gong  ? EYE SURGERY    ? TEE WITHOUT CARDIOVERSION N/A 04/08/2021  ? Procedure: TRANSESOPHAGEAL ECHOCARDIOGRAM (TEE);  Surgeon: Jolaine Artist, MD;  Location: [facility] ENDOSCOPY;  Service: Cardiovascular;  Laterality: N/A;  ? TONSILLECTOMY    ? ? ?MEDICATIONS: ? Alpha-Lipoic Acid 300 MG TABS  ? apixaban (ELIQUIS) 5 MG TABS tablet  ? atorvastatin (LIPITOR) 40 MG tablet  ? B Complex-C (SUPER B COMPLEX/VITAMIN C) TABS  ?  Cholecalciferol (VITAMIN D3) 50 MCG (2000 UT) capsule  ? clindamycin (CLEOCIN) 300 MG capsule  ? dorzolamide-timolol (COSOPT) 22.3-6.8 MG/ML ophthalmic solution  ? FARXIGA 10 MG TABS tablet  ? Lancets (ONETOUCH DELICA PLUS Q000111Q) MISC  ? losartan (COZAAR) 50 MG tablet  ? metFORMIN (GLUCOPHAGE) 1000 MG tablet  ? metoprolol succinate (TOPROL-XL) 25 MG 24 hr tablet  ? ONETOUCH VERIO test strip  ? ?No current facility-administered medications for this encounter.  ? ? ?Myra Gianotti, PA-C ?Surgical Short Stay/Anesthesiology ?[facility] Phone [phone] ?[facility] Phone [phone] ?05/12/2021 4:59 PM ? ? ? ? ? ? ?

## 2021-05-12 NOTE — Progress Notes (Signed)
PCP - Jacinta Shoe MD ?Cardiologist - Dorian Furnace ? ?PPM/ICD - denies ?Device Orders -  ?Rep Notified -  ? ?Chest x-ray - na ?EKG - 04/08/21 ?Stress Test - none ?ECHO - 03/30/21 ?Cardiac Cath - 01/08/19 ? ?Sleep Study - 05/10/21 ?CPAP - no ? ?Fasting Blood Sugar - 115-135 ?Checks Blood Sugar every morning ? ?Blood Thinner Instructions: pt states he stopped Eliquis 05/06/21 per surgeon's instructions. ?Aspirin Instructions:na ? ?ERAS Protcol -clear liquids until 0430 ?PRE-SURGERY Ensure or G2- no ? ?COVID TEST- na ? ? ?Anesthesia review: yes- Rica Mast NP informed this am that pt's chart needs review. ? ?Patient denies shortness of breath, fever, cough and chest pain at PAT appointment ? ? ?All instructions explained to the patient, with a verbal understanding of the material. Patient agrees to go over the instructions while at home for a better understanding. Patient also instructed to wear a mask when out in public prior to surgery. The opportunity to ask questions was provided. ?  ?

## 2021-05-12 NOTE — Anesthesia Preprocedure Evaluation (Addendum)
Anesthesia Evaluation  ?Patient identified by MRN, date of birth, ID band ?Patient awake ? ? ? ?Reviewed: ?Allergy & Precautions, H&P , NPO status , Patient's Chart, lab work & pertinent test results, reviewed documented beta blocker date and time  ? ?Airway ?Mallampati: II ? ?TM Distance: >3 FB ?Neck ROM: Full ? ? ? Dental ?no notable dental hx. ?(+) Teeth Intact, Dental Advisory Given ?  ?Pulmonary ?neg pulmonary ROS,  ?  ?Pulmonary exam normal ?breath sounds clear to auscultation ? ? ? ? ? ? Cardiovascular ?Exercise Tolerance: Good ?hypertension, Pt. on home beta blockers and Pt. on medications ?+ Peripheral Vascular Disease and +CHF  ?+ dysrhythmias Atrial Fibrillation  ?Rhythm:Regular Rate:Normal ? ?TEE with DCCV 04/08/21: ?IMPRESSIONS  ??1. Left ventricular ejection fraction, by estimation, is 45 to 50%. The  ?left ventricle has mildly decreased function. The left ventricle has no  ?regional wall motion abnormalities.  ??2. Right ventricular systolic function is mildly reduced. The right  ?ventricular size is normal.  ??3. Left atrial size was moderately dilated. No left atrial/left atrial  ?appendage thrombus was detected.  ??4. Right atrial size was moderately dilated.  ??5. The mitral valve is normal in structure. Trivial mitral valve  ?regurgitation. No evidence of mitral stenosis.  ??6. The aortic valve has been repaired/replaced. Aortic valve  ?regurgitation is not visualized. No aortic stenosis is present. There is a  ?unknown bioprosthetic valve present in the aortic position.  ??7. There is mild (Grade II) plaque involving the descending aorta.  ??8. The inferior vena cava is normal in size with greater than 50%  ?respiratory variability, suggesting right atrial pressure of 3 mmHg.  ?- Conclusion(s)/Recommendation(s): Normal biventricular function without  ?evidence of hemodynamically significant valvular heart disease.  ? ? ? ?  ?Neuro/Psych ?negative neurological  ROS ? negative psych ROS  ? GI/Hepatic ?negative GI ROS, Neg liver ROS,   ?Endo/Other  ?diabetes, Type 2, Oral Hypoglycemic Agents ? Renal/GU ?negative Renal ROS  ?negative genitourinary ?  ?Musculoskeletal ? ? Abdominal ?  ?Peds ? Hematology ?negative hematology ROS ?(+)   ?Anesthesia Other Findings ? ? Reproductive/Obstetrics ?negative OB ROS ? ?  ? ? ? ? ? ? ? ? ? ? ? ? ? ?  ?  ? ? ? ? ? ? ?Anesthesia Physical ?Anesthesia Plan ? ?ASA: 3 ? ?Anesthesia Plan: General  ? ?Post-op Pain Management: Tylenol PO (pre-op)*  ? ?Induction: Intravenous ? ?PONV Risk Score and Plan: 3 and Ondansetron, Dexamethasone and Treatment may vary due to age or medical condition ? ?Airway Management Planned: Oral ETT ? ?Additional Equipment:  ? ?Intra-op Plan:  ? ?Post-operative Plan: Extubation in OR ? ?Informed Consent: I have reviewed the patients History and Physical, chart, labs and discussed the procedure including the risks, benefits and alternatives for the proposed anesthesia with the patient or authorized representative who has indicated his/her understanding and acceptance.  ? ? ? ?Dental advisory given ? ?Plan Discussed with: CRNA ? ?Anesthesia Plan Comments: (PAT note written 05/12/2021 by Myra Gianotti, PA-C. ?)  ? ? ? ? ?Anesthesia Quick Evaluation ? ?

## 2021-05-13 ENCOUNTER — Other Ambulatory Visit: Payer: Self-pay

## 2021-05-13 ENCOUNTER — Inpatient Hospital Stay (HOSPITAL_COMMUNITY): Payer: Medicare Other

## 2021-05-13 ENCOUNTER — Inpatient Hospital Stay (HOSPITAL_COMMUNITY): Payer: Medicare Other | Admitting: Emergency Medicine

## 2021-05-13 ENCOUNTER — Encounter: Payer: Self-pay | Admitting: *Deleted

## 2021-05-13 ENCOUNTER — Telehealth: Payer: Self-pay | Admitting: *Deleted

## 2021-05-13 ENCOUNTER — Encounter (HOSPITAL_COMMUNITY)
Admission: RE | Disposition: A | Discharge status: Home / Self Care | Payer: Self-pay | Source: Home / Self Care | Attending: Neurosurgery

## 2021-05-13 ENCOUNTER — Other Ambulatory Visit: Payer: Self-pay | Admitting: *Deleted

## 2021-05-13 ENCOUNTER — Encounter (HOSPITAL_COMMUNITY): Payer: Self-pay | Admitting: Neurosurgery

## 2021-05-13 ENCOUNTER — Observation Stay (HOSPITAL_COMMUNITY)
Admission: RE | Admit: 2021-05-13 | Discharge: 2021-05-13 | Disposition: A | Discharge status: Home / Self Care | Payer: Medicare Other | Attending: Neurosurgery | Admitting: Neurosurgery

## 2021-05-13 DIAGNOSIS — I2510 Atherosclerotic heart disease of native coronary artery without angina pectoris: Secondary | ICD-10-CM | POA: Diagnosis not present

## 2021-05-13 DIAGNOSIS — I509 Heart failure, unspecified: Secondary | ICD-10-CM

## 2021-05-13 DIAGNOSIS — M4802 Spinal stenosis, cervical region: Secondary | ICD-10-CM

## 2021-05-13 DIAGNOSIS — I110 Hypertensive heart disease with heart failure: Secondary | ICD-10-CM | POA: Diagnosis not present

## 2021-05-13 DIAGNOSIS — Z79899 Other long term (current) drug therapy: Secondary | ICD-10-CM | POA: Insufficient documentation

## 2021-05-13 DIAGNOSIS — E119 Type 2 diabetes mellitus without complications: Secondary | ICD-10-CM | POA: Insufficient documentation

## 2021-05-13 DIAGNOSIS — Z01818 Encounter for other preprocedural examination: Secondary | ICD-10-CM

## 2021-05-13 DIAGNOSIS — Z7984 Long term (current) use of oral hypoglycemic drugs: Secondary | ICD-10-CM | POA: Diagnosis not present

## 2021-05-13 DIAGNOSIS — E1142 Type 2 diabetes mellitus with diabetic polyneuropathy: Secondary | ICD-10-CM

## 2021-05-13 DIAGNOSIS — M4722 Other spondylosis with radiculopathy, cervical region: Secondary | ICD-10-CM

## 2021-05-13 DIAGNOSIS — I480 Paroxysmal atrial fibrillation: Secondary | ICD-10-CM

## 2021-05-13 HISTORY — PX: ANTERIOR CERVICAL DECOMP/DISCECTOMY FUSION: SHX1161

## 2021-05-13 LAB — GLUCOSE, CAPILLARY
Glucose-Capillary: 148 mg/dL — ABNORMAL HIGH (ref 70–99)
Glucose-Capillary: 173 mg/dL — ABNORMAL HIGH (ref 70–99)
Glucose-Capillary: 197 mg/dL — ABNORMAL HIGH (ref 70–99)

## 2021-05-13 LAB — SURGICAL PCR SCREEN
MRSA, PCR: NEGATIVE
Staphylococcus aureus: NEGATIVE

## 2021-05-13 SURGERY — ANTERIOR CERVICAL DECOMPRESSION/DISCECTOMY FUSION 2 LEVELS
Anesthesia: General | Site: Spine Cervical

## 2021-05-13 MED ORDER — CLINDAMYCIN HCL 300 MG PO CAPS: 600.0000 mg | ORAL_CAPSULE | ORAL | Status: DC

## 2021-05-13 MED ORDER — ONDANSETRON HCL 4 MG PO TABS: 4.0000 mg | ORAL_TABLET | Freq: Four times a day (QID) | ORAL | Status: DC | PRN

## 2021-05-13 MED ORDER — INSULIN ASPART 100 UNIT/ML IJ SOLN: 0.0000 [IU] | Freq: Three times a day (TID) | INTRAMUSCULAR | Status: DC

## 2021-05-13 MED ORDER — APIXABAN 5 MG PO TABS
5.0000 mg | ORAL_TABLET | Freq: Two times a day (BID) | ORAL | Status: DC
  Filled 2021-05-13: qty 1

## 2021-05-13 MED ORDER — LOSARTAN POTASSIUM 50 MG PO TABS: 50.0000 mg | ORAL_TABLET | Freq: Every evening | ORAL | Status: DC

## 2021-05-13 MED ORDER — MIDAZOLAM HCL 2 MG/2ML IJ SOLN
INTRAMUSCULAR | Status: AC
  Filled 2021-05-13: qty 2

## 2021-05-13 MED ORDER — ONDANSETRON HCL 4 MG/2ML IJ SOLN
INTRAMUSCULAR | Status: DC | PRN
  Administered 2021-05-13: 4 mg via INTRAVENOUS

## 2021-05-13 MED ORDER — HYDROMORPHONE HCL 1 MG/ML IJ SOLN: 0.2500 mg | INTRAMUSCULAR | Status: DC | PRN

## 2021-05-13 MED ORDER — CYCLOBENZAPRINE HCL 10 MG PO TABS
10.0000 mg | ORAL_TABLET | Freq: Three times a day (TID) | ORAL | Status: DC | PRN
  Administered 2021-05-13: 10 mg via ORAL
  Filled 2021-05-13: qty 1

## 2021-05-13 MED ORDER — DEXAMETHASONE SODIUM PHOSPHATE 10 MG/ML IJ SOLN
INTRAMUSCULAR | Status: AC
  Filled 2021-05-13: qty 1

## 2021-05-13 MED ORDER — HYDROMORPHONE HCL 1 MG/ML IJ SOLN: 0.5000 mg | INTRAMUSCULAR | Status: DC | PRN

## 2021-05-13 MED ORDER — CEFAZOLIN SODIUM-DEXTROSE 2-4 GM/100ML-% IV SOLN
2.0000 g | Freq: Three times a day (TID) | INTRAVENOUS | Status: DC
  Administered 2021-05-13: 2 g via INTRAVENOUS
  Filled 2021-05-13: qty 100

## 2021-05-13 MED ORDER — CYCLOBENZAPRINE HCL 10 MG PO TABS: 10.0000 mg | ORAL_TABLET | Freq: Three times a day (TID) | ORAL | 0 refills | Status: DC | PRN

## 2021-05-13 MED ORDER — ORAL CARE MOUTH RINSE: 15.0000 mL | Freq: Once | OROMUCOSAL | Status: AC

## 2021-05-13 MED ORDER — ACETAMINOPHEN 500 MG PO TABS: 1000.0000 mg | ORAL_TABLET | Freq: Once | ORAL | Status: AC

## 2021-05-13 MED ORDER — CEFAZOLIN SODIUM-DEXTROSE 2-4 GM/100ML-% IV SOLN
INTRAVENOUS | Status: AC
  Filled 2021-05-13: qty 100

## 2021-05-13 MED ORDER — SUGAMMADEX SODIUM 200 MG/2ML IV SOLN
INTRAVENOUS | Status: DC | PRN
  Administered 2021-05-13: 200 mg via INTRAVENOUS

## 2021-05-13 MED ORDER — PHENYLEPHRINE HCL-NACL 20-0.9 MG/250ML-% IV SOLN
INTRAVENOUS | Status: DC | PRN
  Administered 2021-05-13: 20 ug/min via INTRAVENOUS

## 2021-05-13 MED ORDER — CHLORHEXIDINE GLUCONATE 0.12 % MT SOLN
OROMUCOSAL | Status: AC
  Administered 2021-05-13: 15 mL via OROMUCOSAL
  Filled 2021-05-13: qty 15

## 2021-05-13 MED ORDER — DEXAMETHASONE SODIUM PHOSPHATE 10 MG/ML IJ SOLN
INTRAMUSCULAR | Status: DC | PRN
  Administered 2021-05-13: 10 mg via INTRAVENOUS

## 2021-05-13 MED ORDER — EPHEDRINE SULFATE-NACL 50-0.9 MG/10ML-% IV SOSY
PREFILLED_SYRINGE | INTRAVENOUS | Status: DC | PRN
  Administered 2021-05-13: 5 mg via INTRAVENOUS
  Administered 2021-05-13: 10 mg via INTRAVENOUS
  Administered 2021-05-13 (×2): 5 mg via INTRAVENOUS

## 2021-05-13 MED ORDER — METFORMIN HCL 500 MG PO TABS: 1000.0000 mg | ORAL_TABLET | Freq: Two times a day (BID) | ORAL | Status: DC

## 2021-05-13 MED ORDER — PROPOFOL 10 MG/ML IV BOLUS
INTRAVENOUS | Status: DC | PRN
  Administered 2021-05-13: 170 mg via INTRAVENOUS

## 2021-05-13 MED ORDER — METOPROLOL SUCCINATE ER 25 MG PO TB24: 25.0000 mg | ORAL_TABLET | Freq: Every evening | ORAL | Status: DC

## 2021-05-13 MED ORDER — DORZOLAMIDE HCL-TIMOLOL MAL 2-0.5 % OP SOLN
1.0000 [drp] | Freq: Two times a day (BID) | OPHTHALMIC | Status: DC
  Filled 2021-05-13: qty 10

## 2021-05-13 MED ORDER — THROMBIN 5000 UNITS EX SOLR
CUTANEOUS | Status: DC | PRN
  Administered 2021-05-13: 5000 [IU] via TOPICAL

## 2021-05-13 MED ORDER — THROMBIN 5000 UNITS EX SOLR: OROMUCOSAL | Status: DC | PRN

## 2021-05-13 MED ORDER — ATORVASTATIN CALCIUM 40 MG PO TABS
40.0000 mg | ORAL_TABLET | Freq: Every day | ORAL | Status: DC
  Administered 2021-05-13: 40 mg via ORAL
  Filled 2021-05-13: qty 1

## 2021-05-13 MED ORDER — THROMBIN 5000 UNITS EX SOLR
CUTANEOUS | Status: AC
  Filled 2021-05-13: qty 15000

## 2021-05-13 MED ORDER — EPHEDRINE 5 MG/ML INJ
INTRAVENOUS | Status: AC
  Filled 2021-05-13: qty 5

## 2021-05-13 MED ORDER — LIDOCAINE 2% (20 MG/ML) 5 ML SYRINGE
INTRAMUSCULAR | Status: AC
  Filled 2021-05-13: qty 5

## 2021-05-13 MED ORDER — FENTANYL CITRATE (PF) 250 MCG/5ML IJ SOLN
INTRAMUSCULAR | Status: AC
  Filled 2021-05-13: qty 5

## 2021-05-13 MED ORDER — ONDANSETRON HCL 4 MG/2ML IJ SOLN: 4.0000 mg | Freq: Four times a day (QID) | INTRAMUSCULAR | Status: DC | PRN

## 2021-05-13 MED ORDER — CHLORHEXIDINE GLUCONATE 0.12 % MT SOLN: 15.0000 mL | Freq: Once | OROMUCOSAL | Status: AC

## 2021-05-13 MED ORDER — DAPAGLIFLOZIN PROPANEDIOL 5 MG PO TABS
5.0000 mg | ORAL_TABLET | Freq: Every morning | ORAL | Status: DC
Start: 2021-05-13 — End: 2021-05-14
  Filled 2021-05-13 (×3): qty 1

## 2021-05-13 MED ORDER — ROCURONIUM BROMIDE 10 MG/ML (PF) SYRINGE
PREFILLED_SYRINGE | INTRAVENOUS | Status: AC
  Filled 2021-05-13: qty 10

## 2021-05-13 MED ORDER — LACTATED RINGERS IV SOLN: INTRAVENOUS | Status: DC

## 2021-05-13 MED ORDER — SODIUM CHLORIDE 0.9% FLUSH
3.0000 mL | Freq: Two times a day (BID) | INTRAVENOUS | Status: DC
  Administered 2021-05-13: 3 mL via INTRAVENOUS

## 2021-05-13 MED ORDER — CEFAZOLIN SODIUM-DEXTROSE 2-3 GM-%(50ML) IV SOLR
INTRAVENOUS | Status: DC | PRN
Start: 2021-05-13 — End: 2021-05-13
  Administered 2021-05-13: 2 g via INTRAVENOUS

## 2021-05-13 MED ORDER — PHENOL 1.4 % MT LIQD: 1.0000 | OROMUCOSAL | Status: DC | PRN

## 2021-05-13 MED ORDER — SODIUM CHLORIDE 0.9 % IV SOLN: 250.0000 mL | INTRAVENOUS | Status: DC

## 2021-05-13 MED ORDER — FENTANYL CITRATE (PF) 250 MCG/5ML IJ SOLN
INTRAMUSCULAR | Status: DC | PRN
  Administered 2021-05-13 (×3): 50 ug via INTRAVENOUS

## 2021-05-13 MED ORDER — ALUM & MAG HYDROXIDE-SIMETH 200-200-20 MG/5ML PO SUSP: 30.0000 mL | Freq: Four times a day (QID) | ORAL | Status: DC | PRN

## 2021-05-13 MED ORDER — OXYCODONE HCL 5 MG PO TABS: 5.0000 mg | ORAL_TABLET | ORAL | 0 refills | Status: DC | PRN

## 2021-05-13 MED ORDER — ACETAMINOPHEN 650 MG RE SUPP: 650.0000 mg | RECTAL | Status: DC | PRN

## 2021-05-13 MED ORDER — ONDANSETRON HCL 4 MG/2ML IJ SOLN
INTRAMUSCULAR | Status: AC
  Filled 2021-05-13: qty 2

## 2021-05-13 MED ORDER — HYDROCODONE-ACETAMINOPHEN 5-325 MG PO TABS
2.0000 | ORAL_TABLET | ORAL | Status: DC | PRN
  Administered 2021-05-13: 2 via ORAL
  Filled 2021-05-13: qty 2

## 2021-05-13 MED ORDER — HEMOSTATIC AGENTS (NO CHARGE) OPTIME
TOPICAL | Status: DC | PRN
  Administered 2021-05-13: 1 via TOPICAL

## 2021-05-13 MED ORDER — SODIUM CHLORIDE 0.9% FLUSH: 3.0000 mL | INTRAVENOUS | Status: DC | PRN

## 2021-05-13 MED ORDER — PROPOFOL 10 MG/ML IV BOLUS
INTRAVENOUS | Status: AC
  Filled 2021-05-13: qty 20

## 2021-05-13 MED ORDER — MENTHOL 3 MG MT LOZG: 1.0000 | LOZENGE | OROMUCOSAL | Status: DC | PRN

## 2021-05-13 MED ORDER — ROCURONIUM BROMIDE 10 MG/ML (PF) SYRINGE
PREFILLED_SYRINGE | INTRAVENOUS | Status: DC | PRN
  Administered 2021-05-13 (×2): 20 mg via INTRAVENOUS
  Administered 2021-05-13: 60 mg via INTRAVENOUS

## 2021-05-13 MED ORDER — PANTOPRAZOLE SODIUM 40 MG IV SOLR: 40.0000 mg | Freq: Every day | INTRAVENOUS | Status: DC

## 2021-05-13 MED ORDER — ACETAMINOPHEN 500 MG PO TABS
ORAL_TABLET | ORAL | Status: AC
  Administered 2021-05-13: 1000 mg via ORAL
  Filled 2021-05-13: qty 2

## 2021-05-13 MED ORDER — 0.9 % SODIUM CHLORIDE (POUR BTL) OPTIME
TOPICAL | Status: DC | PRN
  Administered 2021-05-13: 1000 mL

## 2021-05-13 MED ORDER — ACETAMINOPHEN 325 MG PO TABS: 650.0000 mg | ORAL_TABLET | ORAL | Status: DC | PRN

## 2021-05-13 MED ORDER — INSULIN ASPART 100 UNIT/ML IJ SOLN: 0.0000 [IU] | INTRAMUSCULAR | Status: DC | PRN

## 2021-05-13 MED ORDER — LIDOCAINE 2% (20 MG/ML) 5 ML SYRINGE
INTRAMUSCULAR | Status: DC | PRN
  Administered 2021-05-13: 100 mg via INTRAVENOUS

## 2021-05-13 SURGICAL SUPPLY — 61 items
BAG COUNTER SPONGE SURGICOUNT (BAG) ×2 IMPLANT
BAND RUBBER #18 3X1/16 STRL (MISCELLANEOUS) ×4 IMPLANT
BASKET BONE COLLECTION (BASKET) ×3 IMPLANT
BENZOIN TINCTURE PRP APPL 2/3 (GAUZE/BANDAGES/DRESSINGS) ×2 IMPLANT
BIT DRILL NEURO 2X3.1 SFT TUCH (MISCELLANEOUS) ×1 IMPLANT
BONE VIVIGEN FORMABLE 1.3CC (Bone Implant) ×2 IMPLANT
BUR MATCHSTICK NEURO 3.0 LAGG (BURR) ×2 IMPLANT
CANISTER SUCT 3000ML PPV (MISCELLANEOUS) ×2 IMPLANT
CARTRIDGE OIL MAESTRO DRILL (MISCELLANEOUS) ×1 IMPLANT
CLSR STERI-STRIP ANTIMIC 1/2X4 (GAUZE/BANDAGES/DRESSINGS) ×1 IMPLANT
DERMABOND ADVANCED (GAUZE/BANDAGES/DRESSINGS) ×1
DERMABOND ADVANCED .7 DNX12 (GAUZE/BANDAGES/DRESSINGS) IMPLANT
DIFFUSER DRILL AIR PNEUMATIC (MISCELLANEOUS) ×2 IMPLANT
DRAPE C-ARM 42X72 X-RAY (DRAPES) ×4 IMPLANT
DRAPE LAPAROTOMY 100X72 PEDS (DRAPES) ×2 IMPLANT
DRAPE MICROSCOPE LEICA (MISCELLANEOUS) ×2 IMPLANT
DRILL NEURO 2X3.1 SOFT TOUCH (MISCELLANEOUS) ×2
DRSG OPSITE POSTOP 4X6 (GAUZE/BANDAGES/DRESSINGS) ×1 IMPLANT
DURAPREP 6ML APPLICATOR 50/CS (WOUND CARE) ×2 IMPLANT
ELECT COATED BLADE 2.86 ST (ELECTRODE) ×2 IMPLANT
ELECT REM PT RETURN 9FT ADLT (ELECTROSURGICAL) ×2
ELECTRODE REM PT RTRN 9FT ADLT (ELECTROSURGICAL) ×1 IMPLANT
GAUZE 4X4 16PLY ~~LOC~~+RFID DBL (SPONGE) IMPLANT
GAUZE SPONGE 4X4 12PLY STRL (GAUZE/BANDAGES/DRESSINGS) ×2 IMPLANT
GLOVE BIO SURGEON STRL SZ7 (GLOVE) ×2 IMPLANT
GLOVE BIO SURGEON STRL SZ8 (GLOVE) ×3 IMPLANT
GLOVE BIOGEL PI IND STRL 7.0 (GLOVE) IMPLANT
GLOVE BIOGEL PI INDICATOR 7.0 (GLOVE) ×2
GLOVE EXAM NITRILE XL STR (GLOVE) IMPLANT
GLOVE INDICATOR 8.5 STRL (GLOVE) ×2 IMPLANT
GLOVE SURG UNDER POLY LF SZ8.5 (GLOVE) ×2 IMPLANT
GOWN STRL REUS W/ TWL LRG LVL3 (GOWN DISPOSABLE) IMPLANT
GOWN STRL REUS W/ TWL XL LVL3 (GOWN DISPOSABLE) ×1 IMPLANT
GOWN STRL REUS W/TWL 2XL LVL3 (GOWN DISPOSABLE) IMPLANT
GOWN STRL REUS W/TWL LRG LVL3 (GOWN DISPOSABLE) ×1
GOWN STRL REUS W/TWL XL LVL3 (GOWN DISPOSABLE) ×1
GRAFT BNE MATRIX VG FRMBL SM 1 (Bone Implant) IMPLANT
HALTER HD/CHIN CERV TRACTION D (MISCELLANEOUS) ×2 IMPLANT
HEMOSTAT POWDER KIT SURGIFOAM (HEMOSTASIS) ×2 IMPLANT
KIT BASIN OR (CUSTOM PROCEDURE TRAY) ×2 IMPLANT
KIT TURNOVER KIT B (KITS) ×2 IMPLANT
NDL SPNL 20GX3.5 QUINCKE YW (NEEDLE) ×1 IMPLANT
NEEDLE SPNL 20GX3.5 QUINCKE YW (NEEDLE) ×2 IMPLANT
NS IRRIG 1000ML POUR BTL (IV SOLUTION) ×2 IMPLANT
OIL CARTRIDGE MAESTRO DRILL (MISCELLANEOUS) ×2
PACK LAMINECTOMY NEURO (CUSTOM PROCEDURE TRAY) ×2 IMPLANT
PAD ARMBOARD 7.5X6 YLW CONV (MISCELLANEOUS) ×6 IMPLANT
PIN DISTRACTION 14MM (PIN) IMPLANT
PLATE CERV RES 34 2L (Plate) ×1 IMPLANT
SCREW VA SD 4.2X16 (Screw) ×6 IMPLANT
SPACER HEDRON 14X16X7 0D (Spacer) ×1 IMPLANT
SPACER HEDRON 14X16X8 0D (Spacer) ×1 IMPLANT
SPONGE INTESTINAL PEANUT (DISPOSABLE) ×2 IMPLANT
SPONGE SURGIFOAM ABS GEL SZ50 (HEMOSTASIS) ×2 IMPLANT
STRIP CLOSURE SKIN 1/2X4 (GAUZE/BANDAGES/DRESSINGS) ×2 IMPLANT
SUT VIC AB 3-0 SH 8-18 (SUTURE) ×2 IMPLANT
SUT VICRYL 4-0 PS2 18IN ABS (SUTURE) ×2 IMPLANT
TAPE CLOTH 4X10 WHT NS (GAUZE/BANDAGES/DRESSINGS) ×2 IMPLANT
TOWEL GREEN STERILE (TOWEL DISPOSABLE) ×2 IMPLANT
TOWEL GREEN STERILE FF (TOWEL DISPOSABLE) ×2 IMPLANT
WATER STERILE IRR 1000ML POUR (IV SOLUTION) ×2 IMPLANT

## 2021-05-13 NOTE — Progress Notes (Signed)
PT Cancellation Note ? ?Patient Details ?Name: Clifford Alvarez ?MRN: [id number] ?DOB: [birth year] ? ? ?Cancelled Treatment:    Reason Eval/Treat Not Completed: PT screened, no needs identified per Occupational Therapy Evaluation, will sign off. ? ?Ina Homes, PT, DPT ?Acute Rehabilitation Services  ?Pager [phone] ?Office [phone] ? ?[name] ?05/13/2021, 2:59 PM ?

## 2021-05-13 NOTE — Discharge Instructions (Signed)
Wound Care ? ?Keep the incision clean and dry remove the outer dressing in 3 days, ?Do not put any creams, lotions, or ointments on incision. ?Leave steri-strips on neck.  They will fall off by themselves. ? ?Activity ?Walk each and every day, increasing distance each day. ?No lifting greater than 5 lbs.  Avoid excessive neck motion. ?No lifting no bending no twisting no driving or riding a car unless coming back and forth to see me. ?Wear neck brace at all times except when showering.  ? ?Diet ?Resume your normal diet.  ? ?Return to Work ?Will be discussed at you follow up appointment. ? ?Call Your Doctor If Any of These Occur ?Redness, drainage, or swelling at the wound.  ?Temperature greater than 101 degrees. ?Severe pain not relieved by pain medication. ?Incision starts to come apart. ?Follow Up Appt ?Call today for appointment in 1-2 weeks [id number]) or for problems.  If you have any hardware placed in your spine, you will need an x-ray before your appointment. ?  ?

## 2021-05-13 NOTE — Op Note (Signed)
Preoperative diagnosis: Cervical spondylosis with radiculopathy and cervical stenosis C5-6 C6-7. ? ?Postoperative diagnosis: Same. ? ?Procedure: Anterior cervical discectomies and fusion at C5-6 and C6-7 utilizing the Hebron titanium cages packed with locally harvested autograft mixed with Vivigen and anterior cervical plating utilizing the globus resonate plating system. ? ?Surgeon: Donalee Citrin. ? ?Assistant: Julien Girt ? ?Anesthesia: General ? ?EBL: Minimal ? ?HPI: 66-year gentleman progressive worsening neck and shoulder and arm pain work-up revealed cervical spondylosis with stenosis at C5-6 and C6-7.  Due to patient's progression of clinical syndrome imaging findings and failed conservative treatment I recommended anterior cervical discectomies and fusion at those 2 levels.  I extensively reviewed the risks and benefits of the operation with him as well as perioperative course expectations of outcome and alternatives to surgery and he understood and agreed to proceed forward. ? ?Operative procedure: Patient was brought into the OR was [facility] general anesthesia positioned supine neck in slight extension 5 pounds halter traction.  The right side was next prepped and draped in routine sterile fashion.  Preoperative x-ray localized the appropriate level.  A curvilinear incision was made just off the midline to the entry border of the sternocleidomastoid and the superficial abscess was dissected out divided longitudinally.  The avascular plane between the sternomastoid and strap muscle was developed down to the prevertebral fascia and prevertebral fascia was dissected away with Kitners.  Intraoperative x-ray confirmed identification appropriate level.  So utilizing a high-speed drill disc bases were drilled down large anterior osteophytes were bitten off the Leksell rongeur and 2 and 3 Miller Kerrison punch all the bone shavings were captured and a bone basket.  Under microscope illumination first working at C5-6  posterior annulus posterior aspect complex was drilled down aggressive under biting both endplates and identification of posterior longitudinal ligament which was removed in piecemeal fashion.  There was a very large spur coming off of the C5 vertebral body right of center causing severe spinal cord compression of thecal sac compression this was all aggressively under Bitton marching laterally both C6 nerve roots identified both C6 nerve roots decompressed and skeletonized flush with the pedicle.  At the end discectomy there is no further stenosis either centrally or foraminally.  Attention was then taken to C6-7 in a similar fashion pathology here was primarily uncinate hypertrophy severe foraminal stenosis of both C7 nerve roots worse on the right this was all aggressively under Bitton and removed decompressing the central canal and decompressing both C7 nerve roots.  I then sized up a 7 mm 0 degree cage for 5 six 8 mm 0 degree cage for 6 7 packed with locally harvested autograft mixed inserted the disc base 2 mm deep to the anterior vertebral line packed some additional bone graft after placing some Surgifoam along sides of each cage underneath the plate and lateral to the cages then selected a 34 mm globus resonate plate all screws had excellent purchase locking mechanisms were engaged the wound was then copiously irrigated meticulous hemostasis was maintained and the wound was closed in layers with interrupted Vicryl and a running 4-0 subcuticular.  Dermabond benzoin Steri-Strips and a sterile dressing was applied patient recovery in stable condition.  At the end the case all needle counts and sponge counts were correct. ?

## 2021-05-13 NOTE — Anesthesia Postprocedure Evaluation (Signed)
Anesthesia Post Note ? ?Patient: Clifford Alvarez ? ?Procedure(s) Performed: Anterior Cervical Discetomy Fusion - Cervical five-Cervical six - Cervical six-Cervical seven (Spine Cervical) ? ?  ? ?Patient location during evaluation: PACU ?Anesthesia Type: General ?Level of consciousness: awake and alert ?Pain management: pain level controlled ?Vital Signs Assessment: post-procedure vital signs reviewed and stable ?Respiratory status: spontaneous breathing, nonlabored ventilation and respiratory function stable ?Cardiovascular status: blood pressure returned to baseline and stable ?Postop Assessment: no apparent nausea or vomiting ?Anesthetic complications: no ? ? ?No notable events documented. ? ?Last Vitals:  ?Vitals:  ? 05/13/21 1100 05/13/21 1140  ?BP: (!) 162/76 (!) 146/63  ?Pulse: 61 (!) 57  ?Resp: 10 18  ?Temp:  36.6 ?C  ?SpO2: 100% 99%  ?  ?Last Pain:  ?Vitals:  ? 05/13/21 1140  ?TempSrc:   ?PainSc: 0-No pain  ? ? ?  ?  ?  ?  ?  ?  ? ?Davione Lenker,W. EDMOND ? ? ? ? ?

## 2021-05-13 NOTE — Anesthesia Procedure Notes (Signed)
Procedure Name: Intubation ?Date/Time: 05/13/2021 7:53 AM ?Performed by: Dorthea Cove, CRNA ?Pre-anesthesia Checklist: Patient identified, Emergency Drugs available, Suction available and Patient being monitored ?Patient Re-evaluated:Patient Re-evaluated prior to induction ?Oxygen Delivery Method: Circle system utilized ?Preoxygenation: Pre-oxygenation with 100% oxygen ?Induction Type: IV induction ?Ventilation: Mask ventilation without difficulty ?Laryngoscope Size: Glidescope and 3 ?Grade View: Grade I ?Tube type: Oral ?Number of attempts: 1 ?Airway Equipment and Method: Oral airway, Video-laryngoscopy and Rigid stylet ?Placement Confirmation: ETT inserted through vocal cords under direct vision, positive ETCO2 and breath sounds checked- equal and bilateral ?Secured at: 23 cm ?Tube secured with: Tape ?Dental Injury: Teeth and Oropharynx as per pre-operative assessment  ? ? ? ? ?

## 2021-05-13 NOTE — Telephone Encounter (Signed)
-----   Message from Quintella Reichert, MD sent at 05/11/2021  8:22 AM EDT ----- ?Please let patient know that they have sleep apnea.  Recommend therapeutic CPAP titration ASAP for treatment of patient's sleep disordered breathing.  If unable to perform an in lab titration then initiate ResMed auto CPAP from 4 to 15cm H2O with heated humidity and mask of choice and overnight pulse ox on CPAP.    ?

## 2021-05-13 NOTE — H&P (Signed)
Clifford Alvarez is an 66 y.o. male.   ?Chief Complaint: Neck pain left greater than right arm pain ?HPI: 66 year old gentleman with longstanding neck pain radiating down his left arm primarily below but into his right numbness and tingling in what appears to be a C6-C7 distribution down to his thumb and first 2 fingers.  He has been to physical therapy as tried steroids anti-inflammatories and time and nothing is worked for him.  Imaging is shown cervical spondylosis with severe foraminal stenosis at C5-6 C6-7 he is also got a right-sided spur at C3-4 that appears to be asymptomatic.  Patient's predominance of his symptoms appear to be referable to the C5-6 and C6-7 disc base level and due to his failed conservative treatment imaging findings and progression of clinical syndrome I recommended a two-level ACDF at those levels.  I have extensively gone over the risks and benefits of the operation with him as well as perioperative course expectations of outcome and alternatives of surgery and he understood and agreed to proceed forward. ? ?Past Medical History:  ?Diagnosis Date  ? Ascending aortic aneurysm (HCC)   ? 5.4cm ascending aorta by Chest CT 12/2018, s/p thoracic aortic aneurysm repair with Biobentall 21mm Perimount Valve, 18mm graft  ? Bicuspid aortic valve   ? no AS or AI on echo 12/2018  ? CAD (coronary artery disease), native coronary artery   ? minimal CAD of the pLAD and diagonal with 0-24% stenosis  ? CHF (congestive heart failure) (HCC)   ? Coronary artery calcification seen on CAT scan   ? calcium score 11 12/2018  ? Diabetes mellitus without complication (HCC)   ? Dysrhythmia   ? Glaucoma   ? Heart murmur   ? History of childhood heart murmur (Aortic)  ? ? ?Past Surgical History:  ?Procedure Laterality Date  ? BENTALL PROCEDURE  01/10/2019  ? at [facility]  ? CARDIOVERSION N/A 04/08/2021  ? Procedure: CARDIOVERSION;  Surgeon: Dolores Patty, MD;  Location: [facility] ENDOSCOPY;  Service:  Cardiovascular;  Laterality: N/A;  ? COLONOSCOPY  2006  ? Dr. Matthias Hughs  ? EYE SURGERY    ? TEE WITHOUT CARDIOVERSION N/A 04/08/2021  ? Procedure: TRANSESOPHAGEAL ECHOCARDIOGRAM (TEE);  Surgeon: Dolores Patty, MD;  Location: [facility] ENDOSCOPY;  Service: Cardiovascular;  Laterality: N/A;  ? TONSILLECTOMY    ? ? ?Family History  ?Problem Relation Age of Onset  ? Diabetes Mother   ? Hypertension Mother   ? Diabetes Father   ? Heart disease Father   ? Diabetes Brother   ? Diabetes Sister   ? ?Social History:  reports that he has never smoked. He has never used smokeless tobacco. He reports that he does not drink alcohol and does not use drugs. ? ?Allergies:  ?Allergies  ?Allergen Reactions  ? Penicillins   ?  Childhood reaction   ? ? ?Medications Prior to Admission  ?Medication Sig Dispense Refill  ? Alpha-Lipoic Acid 300 MG TABS Take 600 mg by mouth in the morning and at bedtime.    ? atorvastatin (LIPITOR) 40 MG tablet TAKE 1 TABLET BY MOUTH DAILY AT 6 PM. 90 tablet 3  ? B Complex-C (SUPER B COMPLEX/VITAMIN C) TABS 1 tablet by mouth daily    ? Cholecalciferol (VITAMIN D3) 50 MCG (2000 UT) capsule TAKE 1 CAPSULE BY MOUTH EVERY DAY 90 capsule 3  ? dorzolamide-timolol (COSOPT) 22.3-6.8 MG/ML ophthalmic solution Place 1 drop into both eyes 2 (two) times daily.    ? FARXIGA 10 MG  TABS tablet Take 5 mg by mouth in the morning.    ? losartan (COZAAR) 50 MG tablet TAKE 1 TABLET BY MOUTH EVERY DAY (Patient taking differently: Take 50 mg by mouth every evening.) 90 tablet 3  ? metFORMIN (GLUCOPHAGE) 1000 MG tablet TAKE 1 TABLET BY MOUTH TWICE A DAY WITH A MEAL 180 tablet 3  ? metoprolol succinate (TOPROL-XL) 25 MG 24 hr tablet Take 25 mg by mouth every evening.    ? apixaban (ELIQUIS) 5 MG TABS tablet Take 1 tablet (5 mg total) by mouth 2 (two) times daily. 60 tablet 6  ? clindamycin (CLEOCIN) 300 MG capsule Take 600 mg by mouth See admin instructions. 600 mg before and after dental procedures    ? Lancets (ONETOUCH DELICA PLUS  LANCET30G) MISC USE TO TEST BLOOD SUGAR 3 TIMES DAILY AS INSTRUCTED. 100 each 4  ? ONETOUCH VERIO test strip USE AS INSTRUCTED TO CHECK BLOOD SUGAR 2 TIMES A DAY E11.42 100 strip 1  ? ? ?Results for orders placed or performed during the hospital encounter of 05/13/21 (from the past 48 hour(s))  ?Glucose, capillary     Status: Abnormal  ? Collection Time: 05/13/21  6:10 AM  ?Result Value Ref Range  ? Glucose-Capillary 148 (H) 70 - 99 mg/dL  ?  Comment: Glucose reference range applies only to samples taken after fasting for at least 8 hours.  ? ?No results found. ? ?Review of Systems  ?Musculoskeletal:  Positive for neck pain.  ?Neurological:  Positive for numbness.  ? ?Blood pressure 132/71, pulse (!) 59, temperature 98.6 ?F (37 ?C), temperature source Oral, resp. rate 18, height 6\' 5"  (1.956 m), weight 88.5 kg, SpO2 98 %. ?Physical Exam ?HENT:  ?   Head: Normocephalic.  ?   Right Ear: Tympanic membrane normal.  ?   Nose: Nose normal.  ?Cardiovascular:  ?   Rate and Rhythm: Normal rate.  ?Pulmonary:  ?   Effort: Pulmonary effort is normal.  ?Abdominal:  ?   General: Abdomen is flat.  ?Musculoskeletal:     ?   General: Normal range of motion.  ?Skin: ?   General: Skin is warm.  ?Neurological:  ?   Mental Status: He is alert.  ?   Comments: Is awake and alert strength is 5 out of 5 deltoid, bicep, tricep, wrist flexion, wrist extension, hand intrinsics.  ?  ? ?Assessment/Plan ?66 year old gentleman presents for ACDF C5-6 C6-7 ? ?79, MD ?05/13/2021, 7:18 AM ? ? ? ?

## 2021-05-13 NOTE — Care Management CC44 (Signed)
Condition Code 44 Documentation Completed ? ?Patient Details  ?Name: Clifford Alvarez ?MRN: [id number] ?Date of Birth: 1955-08-23 ? ? ?Condition Code 44 given:  Yes  ?Patient signature on Condition Code 44 notice:   Yes ?Documentation of 2 MD's agreement:  Yes  ?Code 44 added to claim:  Yes  ? ? ? ?Lavenia Atlas, RN ?05/13/2021, 5:25 PM ? ?

## 2021-05-13 NOTE — Discharge Summary (Signed)
Physician Discharge Summary  ?Patient ID: ?Clifford Alvarez ?MRN: [id number] ?DOB/AGE: 02-20-1955 66 y.o. ? ?Admit date: 05/13/2021 ?Discharge date: 05/13/2021 ? ?Admission Diagnoses:Cervical spinal stenosis C5/6, C6/7 ? ?Discharge Diagnoses: same ?Principal Problem: ?  Spinal stenosis of cervical region ? ? ?Discharged Condition: good ? ?Hospital Course: Clifford Alvarez was taken to the operating room for a routine cervical decompression and arthrodesis at C5/6, 6/7. Post op he is voiding, ambulating and tolerating a regular diet. His wound is clean, dry, and without signs of infection. His voice is strong.  ? ?Treatments: surgery: Anterior cervical discectomies and fusion at C5-6 and C6-7 utilizing the Hebron titanium cages packed with locally harvested autograft mixed with Vivigen and anterior cervical plating utilizing the globus resonate plating system. ? ?Discharge Exam: ?Blood pressure 140/68, pulse 66, temperature 98.4 ?F (36.9 ?C), temperature source Oral, resp. rate 18, height 6\' 5"  (1.956 m), weight 88.5 kg, SpO2 96 %. ?General appearance: alert, cooperative, appears stated age, and no distress ? ?Disposition: Discharge disposition: 01-Home or Self Care ? ? ? ? ? ?Stenosis ? ?Allergies as of 05/13/2021   ? ?   Reactions  ? Penicillins   ? Childhood reaction   ? ?  ? ?  ?Medication List  ?  ? ?TAKE these medications   ? ?Alpha-Lipoic Acid 300 MG Tabs ?Take 600 mg by mouth in the morning and at bedtime. ?  ?apixaban 5 MG Tabs tablet ?Commonly known as: ELIQUIS ?Take 1 tablet (5 mg total) by mouth 2 (two) times daily. ?  ?atorvastatin 40 MG tablet ?Commonly known as: LIPITOR ?TAKE 1 TABLET BY MOUTH DAILY AT 6 PM. ?  ?clindamycin 300 MG capsule ?Commonly known as: CLEOCIN ?Take 600 mg by mouth See admin instructions. 600 mg before and after dental procedures ?  ?cyclobenzaprine 10 MG tablet ?Commonly known as: FLEXERIL ?Take 1 tablet (10 mg total) by mouth 3 (three) times daily as needed for muscle spasms. ?   ?dorzolamide-timolol 22.3-6.8 MG/ML ophthalmic solution ?Commonly known as: COSOPT ?Place 1 drop into both eyes 2 (two) times daily. ?  ?Farxiga 10 MG Tabs tablet ?Generic drug: dapagliflozin propanediol ?Take 5 mg by mouth in the morning. ?  ?losartan 50 MG tablet ?Commonly known as: COZAAR ?TAKE 1 TABLET BY MOUTH EVERY DAY ?What changed: when to take this ?  ?metFORMIN 1000 MG tablet ?Commonly known as: GLUCOPHAGE ?TAKE 1 TABLET BY MOUTH TWICE A DAY WITH A MEAL ?  ?metoprolol succinate 25 MG 24 hr tablet ?Commonly known as: TOPROL-XL ?Take 25 mg by mouth every evening. ?  ?OneTouch Delica Plus Lancet30G Misc ?USE TO TEST BLOOD SUGAR 3 TIMES DAILY AS INSTRUCTED. ?  ?OneTouch Verio test strip ?Generic drug: glucose blood ?USE AS INSTRUCTED TO CHECK BLOOD SUGAR 2 TIMES A DAY E11.42 ?  ?oxyCODONE 5 MG immediate release tablet ?Commonly known as: Roxicodone ?Take 1 tablet (5 mg total) by mouth every 4 (four) hours as needed for severe pain. ?  ?Super B Complex/Vitamin C Tabs ?1 tablet by mouth daily ?  ?Vitamin D3 50 MCG (2000 UT) capsule ?TAKE 1 CAPSULE BY MOUTH EVERY DAY ?  ? ?  ? ? Follow-up Information   ? ? 07/13/2021, MD Follow up.   ?Specialty: Neurosurgery ?Why: keep your scheduled appointment ?Contact information: ?[address] ?[address] ?[place] ?[phone] ? ? ?  ?  ? ?  ?  ? ?  ? ? ?Signed: ?[phone] ?05/13/2021, 5:54 PM ?  ? ?

## 2021-05-13 NOTE — Transfer of Care (Signed)
Immediate Anesthesia Transfer of Care Note ? ?Patient: Clifford Alvarez ? ?Procedure(s) Performed: Anterior Cervical Discetomy Fusion - Cervical five-Cervical six - Cervical six-Cervical seven (Spine Cervical) ? ?Patient Location: PACU ? ?Anesthesia Type:General ? ?Level of Consciousness: awake, drowsy and patient cooperative ? ?Airway & Oxygen Therapy: Patient Spontanous Breathing and Patient connected to face mask oxygen ? ?Post-op Assessment: Report given to RN and Post -op Vital signs reviewed and stable ? ?Post vital signs: Reviewed and stable ? ?Last Vitals:  ?Vitals Value Taken Time  ?BP 186/81 05/13/21 1033  ?Temp    ?Pulse 64 05/13/21 1033  ?Resp 11 05/13/21 1033  ?SpO2 100 % 05/13/21 1033  ?Vitals shown include unvalidated device data. ? ?Last Pain:  ?Vitals:  ? 05/13/21 0628  ?TempSrc:   ?PainSc: 0-No pain  ?   ? ?  ? ?Complications: No notable events documented. ?

## 2021-05-13 NOTE — Progress Notes (Signed)
Patient awaiting transport to his vehicle for discharge home; in no acute distress nor complaints of pain nor discomfort; moves all extremities well; incision on his neck with honeycomb dressing and is clean, dry and intact with soft collar on; room was checked for all his belongings and was taken along by wife on the way to her vehicle; discharge instructions concerning medications, incision care, follow up appointment and when to call the doctor as needed were all discussed to patient and his wife by RN and both expressed understanding on the instructions given. ?

## 2021-05-13 NOTE — Evaluation (Signed)
Occupational Therapy Evaluation ?Patient Details ?Name: Clifford Alvarez ?MRN: [id number] ?DOB: Jun 08, 1955 ?Today's Date: 05/13/2021 ? ? ?History of Present Illness 66 yo M s/p ACDF.  PMH includes: HTN, DM and CAD.  ? ?Clinical Impression ?  ?Patient admitted for the above procedure.  Patient presents with minimal discomfort through his mid back and neck region, but otherwise feels very good.  Precaution sheet issued and reviewed.  The patient and his spouse have very good understanding of all precautions, and only minimal cueing was needed.  Patient is essentially at his baseline for stairs, mobility and ADL completion.  He will have any needed assist at home.  All questions answered, and no further needs in the acute setting.  Recommend follow MD instructions post acute.   ?   ? ?Recommendations for follow up therapy are one component of a multi-disciplinary discharge planning process, led by the attending physician.  Recommendations may be updated based on patient status, additional functional criteria and insurance authorization.  ? ?Follow Up Recommendations ? No OT follow up  ?  ?Assistance Recommended at Discharge PRN  ?Patient can return home with the following   ? ?  ?Functional Status Assessment ? Patient has not had a recent decline in their functional status  ?Equipment Recommendations ? None recommended by OT  ?  ?Recommendations for Other Services   ? ? ?  ?Precautions / Restrictions Precautions ?Precautions: Cervical ?Precaution Booklet Issued: Yes (comment) ?Precaution Comments: verbalized understanding ?Required Braces or Orthoses: Cervical Brace ?Cervical Brace: Soft collar;For comfort ?Restrictions ?Weight Bearing Restrictions: No  ? ?  ? ?Mobility Bed Mobility ?Overal bed mobility: Independent ?  ?  ?  ?  ?  ?  ?  ?  ? ?Transfers ?Overall transfer level: Independent ?  ?  ?  ?  ?  ?  ?  ?  ?  ?  ? ?  ?Balance Overall balance assessment: No apparent balance deficits (not formally assessed) ?  ?  ?   ?  ?  ?  ?  ?  ?  ?  ?  ?  ?  ?  ?  ?  ?  ?  ?   ? ?ADL either performed or assessed with clinical judgement  ? ?ADL Overall ADL's : At baseline ?  ?  ?  ?  ?  ?  ?  ?  ?  ?  ?  ?  ?  ?  ?  ?  ?  ?  ?  ?   ? ? ? ?Vision Baseline Vision/History: 3 Glaucoma ?Patient Visual Report: No change from baseline ?   ?   ?Perception Perception ?Perception: Not tested ?  ?Praxis Praxis ?Praxis: Not tested ?  ? ?Pertinent Vitals/Pain Pain Assessment ?Pain Assessment: 0-10 ?Pain Score: 2  ?Pain Location: upper traps and mid back ?Pain Descriptors / Indicators: Aching ?Pain Intervention(s): Monitored during session  ? ? ? ?Hand Dominance Right ?  ?Extremity/Trunk Assessment Upper Extremity Assessment ?Upper Extremity Assessment: Overall WFL for tasks assessed ?  ?Lower Extremity Assessment ?Lower Extremity Assessment: Overall WFL for tasks assessed ?  ?Cervical / Trunk Assessment ?Cervical / Trunk Assessment: Normal ?  ?Communication Communication ?Communication: No difficulties ?  ?Cognition Arousal/Alertness: Awake/alert ?Behavior During Therapy: [facility] for tasks assessed/performed ?Overall Cognitive Status: Within Functional Limits for tasks assessed ?  ?  ?  ?  ?  ?  ?  ?  ?  ?  ?  ?  ?  ?  ?  ?  ?  ?  ?  ?  General Comments   VSS on RA ? ?  ?Exercises   ?  ?Shoulder Instructions    ? ? ?Home Living Family/patient expects to be discharged to:: Private residence ?Living Arrangements: Spouse/significant other ?Available Help at Discharge: Family;Available 24 hours/day ?Type of Home: House ?Home Access: Stairs to enter ?  ?  ?Home Layout: 1/2 bath on main level;Bed/bath upstairs ?  ?  ?Bathroom Shower/Tub: Walk-in shower ?  ?Bathroom Toilet: Standard ?Bathroom Accessibility: Yes ?How Accessible: Accessible via walker ?Home Equipment: None ?  ?  ?  ? ?  ?Prior Functioning/Environment Prior Level of Function : Independent/Modified Independent;Driving;Working/employed ?  ?  ?  ?  ?  ?  ?  ?  ?  ? ?  ?  ?OT Problem List: Pain ?  ?   ?OT  Treatment/Interventions:    ?  ?OT Goals(Current goals can be found in the care plan section) Acute Rehab OT Goals ?Patient Stated Goal: Return home ?OT Goal Formulation: With patient ?Time For Goal Achievement: 05/16/21 ?Potential to Achieve Goals: Good  ?OT Frequency:   ?  ? ?Co-evaluation   ?  ?  ?  ?  ? ?  ?AM-PAC OT "6 Clicks" Daily Activity     ?Outcome Measure Help from another person eating meals?: None ?Help from another person taking care of personal grooming?: None ?Help from another person toileting, which includes using toliet, bedpan, or urinal?: None ?Help from another person bathing (including washing, rinsing, drying)?: None ?Help from another person to put on and taking off regular upper body clothing?: None ?Help from another person to put on and taking off regular lower body clothing?: None ?6 Click Score: 24 ?  ?End of Session Nurse Communication: Mobility status ? ?Activity Tolerance: Patient tolerated treatment well ?Patient left: in bed;with call bell/phone within reach;with family/visitor present ? ?OT Visit Diagnosis: Pain ?Pain - Right/Left: Right ?Pain - part of body: Arm;Hand  ?              ?Time: 9150-5697 ?OT Time Calculation (min): 21 min ?Charges:  OT General Charges ?$OT Visit: 1 Visit ?OT Evaluation ?$OT Eval Moderate Complexity: 1 Mod ? ?05/13/2021 ? ?RP, OTR/L ? ?Acute Rehabilitation Services ? ?Office:  [phone] ? ? ?Domenique Southers D Jamorris Ndiaye ?05/13/2021, 2:33 PM ?

## 2021-05-13 NOTE — Care Management Obs Status (Addendum)
MEDICARE OBSERVATION STATUS NOTIFICATION ? ? ?Patient Details  ?Name: Clifford Alvarez ?MRN: [id number] ?Date of Birth: [birth year] ? ? ?Medicare Observation Status Notification Given:  Yes  ? ?Due to [facility] virtual will be mailed to patient. ? ?Lavenia Atlas, RN ?05/13/2021, 5:25 PM ?

## 2021-05-13 NOTE — Progress Notes (Signed)
Orthopedic Tech Progress Note ?Patient Details:  ?Clifford Alvarez ?[birth year] ?[id number] ? ?Ortho Devices ?Type of Ortho Device: Soft collar ?Ortho Device/Splint Interventions: Application ?  ?Post Interventions ?Patient Tolerated: Well ?Instructions Provided: Care of device ? ?Saul Fordyce ?05/13/2021, 11:55 AM ? ?

## 2021-05-14 ENCOUNTER — Encounter (HOSPITAL_COMMUNITY): Payer: Self-pay | Admitting: Neurosurgery

## 2021-05-27 NOTE — Telephone Encounter (Signed)
Went over ablation instruction sent over [name].  Patient verbalized understanding and agreement.

## 2021-05-28 ENCOUNTER — Other Ambulatory Visit: Payer: Self-pay | Admitting: Internal Medicine

## 2021-05-28 DIAGNOSIS — E1142 Type 2 diabetes mellitus with diabetic polyneuropathy: Secondary | ICD-10-CM

## 2021-06-01 ENCOUNTER — Telehealth: Payer: Self-pay | Admitting: *Deleted

## 2021-06-01 DIAGNOSIS — G4733 Obstructive sleep apnea (adult) (pediatric): Secondary | ICD-10-CM

## 2021-06-01 NOTE — Telephone Encounter (Signed)
-----   Message from Gaynelle Cage, [place] sent at 05/13/2021  4:01 PM EDT -----  ----- Message ----- From: Quintella Reichert, MD Sent: 05/11/2021   8:22 AM EDT To: Cv Div Sleep Studies  Please let patient know that they have sleep apnea.  Recommend therapeutic CPAP titration ASAP for treatment of patient's sleep disordered breathing.  If unable to perform an in lab titration then initiate ResMed auto CPAP from 4 to 15cm H2O with heated humidity and mask of choice and overnight pulse ox on CPAP.

## 2021-06-01 NOTE — Telephone Encounter (Signed)
The patient has been notified of the result. Left detailed message on voicemail and informed patient to call back with questions.Billee Balcerzak Green, CMA 06/01/2021 12:08 PM    

## 2021-06-03 NOTE — Addendum Note (Signed)
Addended by: Reesa Chew on: 06/03/2021 05:19 PM   Modules accepted: Orders

## 2021-06-03 NOTE — Telephone Encounter (Signed)
RETURN CALL: Patient says he has other test scheduled already so he would like the titration put off for a while. He also does not think his test is accurate and he will send dr Mayford Knife his questions in his mychart.

## 2021-06-06 ENCOUNTER — Ambulatory Visit (INDEPENDENT_AMBULATORY_CARE_PROVIDER_SITE_OTHER): Payer: Medicare Other | Admitting: Internal Medicine

## 2021-06-06 ENCOUNTER — Encounter: Payer: Self-pay | Admitting: Internal Medicine

## 2021-06-06 VITALS — BP 110/66 | HR 63 | Ht 77.0 in | Wt 195.0 lb

## 2021-06-06 DIAGNOSIS — E7800 Pure hypercholesterolemia, unspecified: Secondary | ICD-10-CM

## 2021-06-06 DIAGNOSIS — E1159 Type 2 diabetes mellitus with other circulatory complications: Secondary | ICD-10-CM

## 2021-06-06 DIAGNOSIS — I2510 Atherosclerotic heart disease of native coronary artery without angina pectoris: Secondary | ICD-10-CM | POA: Diagnosis not present

## 2021-06-06 DIAGNOSIS — G63 Polyneuropathy in diseases classified elsewhere: Secondary | ICD-10-CM

## 2021-06-06 LAB — LIPID PANEL
Cholesterol: 118 mg/dL (ref 0–200)
HDL: 34.7 mg/dL — ABNORMAL LOW (ref >39.00)
LDL Cholesterol: 54 mg/dL (ref 0–99)
NonHDL: 83.03
Total CHOL/HDL Ratio: 3
Triglycerides: 145 mg/dL (ref 0.0–149.0)
VLDL: 29 mg/dL (ref 0.0–40.0)

## 2021-06-06 NOTE — Progress Notes (Signed)
Patient ID: Clifford Alvarez, male   DOB: 07/15/1955, 66 y.o.   MRN: [id number]  HPI: Clifford Alvarez is a 66 y.o.-year-old male, returning for f/u for DM2 dx 01/28/2013, previously insulin-dependent, now off insulin, controlled, with complications (CAD, Peripheral neuropathy). Last visit was 6 months ago.  Interim history: No increased urination, blurry vision, nausea, chest pain. Last month he had discectomy 05/13/2021. Pain and numbness in L arm resolved. He also had a TEE and 2 cataract surgeries since last visit.  Reviewed HbA1c levels: Lab Results  Component Value Date   HGBA1C 6.1 (H) 05/12/2021   HGBA1C 6.0 (A) 12/13/2020   HGBA1C 5.9 (A) 06/01/2020   HGBA1C 5.9 (A) 12/08/2019   HGBA1C 6.4 07/09/2019   HGBA1C 6.4 (A) 06/30/2019   HGBA1C 6.0 (A) 02/12/2019   HGBA1C 6.0 (A) 11/11/2018   HGBA1C 6.1 (A) 05/20/2018   HGBA1C 5.8 (A) 11/19/2017   HGBA1C 6.0 05/14/2017   HGBA1C 6 01/08/2017   HGBA1C 6.3 10/26/2016   HGBA1C 5.7 08/07/2016   HGBA1C 6.0 04/17/2016   HGBA1C 6.0 12/13/2015   HGBA1C 5.9 08/12/2015   HGBA1C 5.9 04/13/2015   HGBA1C 5.8 12/15/2014   HGBA1C 5.9 08/17/2014   He is on: - Metformin 1000 mg 2x a day with meals - Jardiance 25 mg in am >> Farxiga 10 mg in a.m. >> Invokana 100,  then 300 mg daily  >> Farxiga 5 mg daily - 02/2019. He was previously on Basaglar 12 units at bedtime >> stopped 08/2016 >> restarted: 6-8 units 3x a day >> now off completely.  Pt checks his sugars once a day: - am: 110-140s >> 110-140s >> 97-146 (most 120-135) >> 90, 115-135 - 2h after b'fast: 146 >> 197 >> n/c  - before lunch:  102, 128 >> n/c - 2h after lunch: 1n/c >> 126 >> n/c - before dinner: n/c >> n/c >> 124-142 >> n/c - 2h after dinner:  110-140s >> 130-140 >> n/c - bedtime:  120-174 >> n/c - nighttime:  123-145, 170 >> n/c Lowest sugar was 115 >> 110 >> 100s >> 97 >> 90.  He  has hypoglycemia awareness in the 70s. Highest sugar was 160s >> 170 (Txgiving) >>  <170 >> 146 >> 135.  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  Pt's meals are: - Breakfast: bagel, oatmeal, granola, eggs - Lunch: meat + 2-3 vegetables; soup; salad; sandwich - Dinner: meat + 2-3 vegetables; pasta; [place]; seafood - Snacks: 1 a day >> nuts, popcorn, dark chocolate  -No CKD, last BUN/creatinine:  Lab Results  Component Value Date   BUN 20 05/12/2021   CREATININE 1.05 05/12/2021   Lab Results  Component Value Date   GFRNONAA >60 05/12/2021   GFRNONAA >60 03/31/2021   GFRNONAA >60 11/05/2019   GFRNONAA >60 01/24/2019   GFRNONAA >89 04/13/2015   No available: Lab Results  Component Value Date   MICRALBCREAT 0.5 07/09/2019   MICRALBCREAT 0.6 11/04/2018   MICRALBCREAT 1.6 10/29/2017   MICRALBCREAT 0.8 10/26/2016   MICRALBCREAT 0.6 04/13/2015   MICRALBCREAT 0.7 05/11/2014   MICRALBCREAT 0.3 03/12/2013  On losartan.  -+ HL; reviewed latest lipid panel: Lab Results  Component Value Date   CHOL 113 07/09/2019   HDL 37.20 (L) 07/09/2019   LDLCALC 56 07/09/2019   LDLDIRECT 84.0 11/04/2018   TRIG 102.0 07/09/2019   CHOLHDL 3 07/09/2019  On Lipitor 40.  - last eye exam was on 01/31/2021: No DR, + primary open-angle glaucoma OU at [facility].  Dr. Lottie Dawson.  He is seen every 4 mo - has frequent VF tests.  He had cataract surgeries.  - He has numbness but no tingling in his feet.  He is status post ascending aortic aneurysm repair at [facility] clinic in 01/2018. He has persistent A-fib, CAD, severe OSA.  ROS: + see HPI Neurological: no tremors/+ numbness/no tingling/no dizziness  I reviewed pt's medications, allergies, PMH, social hx, family hx, and changes were documented in the history of present illness. Otherwise, unchanged from my initial visit note.  Past Medical History:  Diagnosis Date   Ascending aortic aneurysm (HCC)    5.4cm ascending aorta by Chest CT 12/2018, s/p thoracic aortic aneurysm repair with Biobentall 40mm Perimount Valve, 55mm graft    Bicuspid aortic valve    no AS or AI on echo 12/2018   CAD (coronary artery disease), native coronary artery    minimal CAD of the pLAD and diagonal with 0-24% stenosis   CHF (congestive heart failure) (HCC)    Coronary artery calcification seen on CAT scan    calcium score 11 12/2018   Diabetes mellitus without complication (HCC)    Dysrhythmia    Glaucoma    Heart murmur    History of childhood heart murmur (Aortic)   Past Surgical History:  Procedure Laterality Date   ANTERIOR CERVICAL DECOMP/DISCECTOMY FUSION N/A 05/13/2021   Procedure: Anterior Cervical Discetomy Fusion - Cervical five-Cervical six - Cervical six-Cervical seven;  Surgeon: Donalee Citrin, MD;  Location: [facility] OR;  Service: Neurosurgery;  Laterality: N/A;   BENTALL PROCEDURE  01/10/2019   at [facility]   CARDIOVERSION N/A 04/08/2021   Procedure: CARDIOVERSION;  Surgeon: Dolores Patty, MD;  Location: [facility] ENDOSCOPY;  Service: Cardiovascular;  Laterality: N/A;   COLONOSCOPY  2006   Dr. 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  Tobacco Use   Smoking status: Never   Smokeless tobacco: Never  Vaping Use   Vaping Use: Never used  Substance and Sexual Activity   Alcohol use: No   Drug use: No   Sexual activity: Not on file  Other Topics Concern   Not on file  Social History Narrative   Married 38 years   Wife - Eunice Blase   Son 79   Daughter 60   Social Determinants of Corporate investment banker Strain: Not on Ship broker  Insecurity: Not on file  Transportation Needs: Not on file  Physical Activity: Not on file  Stress: Not on file  Social Connections: Not on file  Intimate Partner Violence: Not on file   Current Outpatient Medications on File Prior to Visit  Medication Sig Dispense Refill   Alpha-Lipoic Acid 300 MG TABS Take 600 mg by mouth in the morning and at bedtime.     apixaban (ELIQUIS) 5 MG TABS tablet Take 1 tablet (5 mg total) by mouth 2 (two) times daily. 60 tablet 6   atorvastatin (LIPITOR) 40 MG tablet TAKE 1 TABLET BY  MOUTH DAILY AT 6 PM. 90 tablet 3   B Complex-C (SUPER B COMPLEX/VITAMIN C) TABS 1 tablet by mouth daily     Cholecalciferol (VITAMIN D3) 50 MCG (2000 UT) capsule TAKE 1 CAPSULE BY MOUTH EVERY DAY 90 capsule 3   clindamycin (CLEOCIN) 300 MG capsule Take 600 mg by mouth See admin instructions. 600 mg before and after dental procedures     cyclobenzaprine (FLEXERIL) 10 MG tablet Take 1 tablet (10 mg total) by mouth 3 (three) times daily as needed for muscle spasms. 60 tablet 0   dorzolamide-timolol (COSOPT) 22.3-6.8 MG/ML ophthalmic solution Place 1 drop into both eyes 2 (two) times daily.     FARXIGA 10 MG TABS tablet Take 5 mg by mouth in the morning.     Lancets (ONETOUCH DELICA PLUS LANCET30G) MISC USE TO TEST BLOOD SUGAR 3 TIMES DAILY AS INSTRUCTED. 100 each 4   losartan (COZAAR) 50 MG tablet TAKE 1 TABLET BY MOUTH EVERY DAY (Patient taking differently: Take 50 mg by mouth every evening.) 90 tablet 3   metFORMIN (GLUCOPHAGE) 1000 MG tablet TAKE 1 TABLET BY MOUTH TWICE A DAY WITH A MEAL 180 tablet 3   metoprolol succinate (TOPROL-XL) 25 MG 24 hr tablet Take 25 mg by mouth every evening.     ONETOUCH VERIO test strip USE AS INSTRUCTED TO CHECK BLOOD SUGAR 2 TIMES A DAY E11.42 100 strip 1   oxyCODONE (ROXICODONE) 5 MG immediate release tablet Take 1 tablet (5 mg total) by mouth every 4 (four) hours as needed for severe pain. 30 tablet 0   No current facility-administered  medications on file prior to visit.   Allergies  Allergen Reactions   Penicillins     Childhood reaction    Family History  Problem Relation Age of Onset   Diabetes Mother    Hypertension Mother    Diabetes Father    Heart disease Father    Diabetes Brother    Diabetes Sister    PE: BP 110/66 (BP Location: Right Arm, Patient Position: Sitting, Cuff Size: Normal)   Pulse 63   Ht 6\' 5"  (1.956 m)   Wt 195 lb (88.5 kg)   SpO2 96%   BMI 23.12 kg/m    Wt Readings from Last 3 Encounters:  06/06/21 195 lb (88.5 kg)  05/13/21 195 lb (88.5 kg)  05/12/21 197 lb 3.2 oz (89.4 kg)   Constitutional: Slightly overweight, in NAD Eyes: PERRLA, EOMI, no exophthalmos ENT: moist mucous membranes, no thyromegaly, no cervical lymphadenopathy Cardiovascular: RRR, No MRG Respiratory: CTA B Musculoskeletal: no deformities Skin: moist, warm, no rashes Neurological: no tremor with outstretched hands  ASSESSMENT: 1. DM2, insulin-independent, controlled, with complications  - CAD - PN -stable  2. PN 2/2 diabetes  3. HL  PLAN:  1. Patient with well-controlled type 2 diabetes, on oral antidiabetic regimen with metformin and low-dose SGLT2 inhibitor.  At last visit, he was only checking sugars in the morning and they were mostly at goal, with few exceptions.  I advised him to also check later in the day, rotating check times.  We did not change his regimen.  Of note, he cuts Farxiga 10 mg tablets in half as this is cheaper for him. -We reviewed together his latest HbA1c which was only slightly higher than before, but still excellent, at 6.1% last month -At today's visit, he is still only checking sugars in the morning.  The vast majority of them are at goal.  He feels that they are  better than at last visit.  For now, we will continue the current regimen. - I suggested to: Patient Instructions  Please  continue: - Metformin 1000 mg 2x a day with meals - Farxiga 5 mg daily before breakfast     Please return in 6 months with your sugar log.   - advised to check sugars at different times of the day - 1x a day, rotating check times - advised for yearly eye exams >> he is UTD -but has another appointment this morning. - we will check his foot exam at next visit as he is in a hurry today to make it to his eye appointment - return to clinic in 6 months  2. PN 2/2 DM  -He continues to have numbness in his feet, but no pain -He is on alpha-lipoic acid and B complex, which are helping  3. HL -Reviewed latest lipid panel from 07/2019: LDL at goal, HDL slightly low: Lab Results  Component Value Date   CHOL 113 07/09/2019   HDL 37.20 (L) 07/09/2019   LDLCALC 56 07/09/2019   LDLDIRECT 84.0 11/04/2018   TRIG 102.0 07/09/2019   CHOLHDL 3 07/09/2019  -He continues on Lipitor 40 mg daily without side effects -He is due for another lipid panel -we will check today-he is fasting  Component     Latest Ref Rng 06/06/2021  Cholesterol     0 - 200 mg/dL 235   Triglycerides     0.0 - 149.0 mg/dL 573.2   HDL Cholesterol     >39.00 mg/dL 20.25 (L)   VLDL     0.0 - 40.0 mg/dL 42.7   LDL (calc)     0 - 99 mg/dL 54   Total CHOL/HDL Ratio 3   NonHDL 83.03     HDL slightly low, the rest of the fractions at goal.  [name], MD PhD [facility]

## 2021-06-06 NOTE — Patient Instructions (Signed)
Please  continue: - Metformin 1000 mg 2x a day with meals - Farxiga 5 mg daily before breakfast    Please return in 6 months with your sugar log.

## 2021-06-09 ENCOUNTER — Other Ambulatory Visit: Payer: Medicare Other

## 2021-06-09 DIAGNOSIS — I480 Paroxysmal atrial fibrillation: Secondary | ICD-10-CM

## 2021-06-09 DIAGNOSIS — Z01818 Encounter for other preprocedural examination: Secondary | ICD-10-CM

## 2021-06-09 LAB — CBC WITH DIFFERENTIAL/PLATELET
Basophils Absolute: 0 10*3/uL (ref 0.0–0.2)
Basos: 0 %
EOS (ABSOLUTE): 0.2 10*3/uL (ref 0.0–0.4)
Eos: 2 %
Hematocrit: 40.8 % (ref 37.5–51.0)
Hemoglobin: 13.6 g/dL (ref 13.0–17.7)
Lymphocytes Absolute: 2.3 10*3/uL (ref 0.7–3.1)
Lymphs: 31 %
MCH: 30.9 pg (ref 26.6–33.0)
MCHC: 33.3 g/dL (ref 31.5–35.7)
MCV: 93 fL (ref 79–97)
Monocytes Absolute: 0.9 10*3/uL (ref 0.1–0.9)
Monocytes: 12 %
Neutrophils Absolute: 4.1 10*3/uL (ref 1.4–7.0)
Neutrophils: 55 %
Platelets: 171 10*3/uL (ref 150–450)
RBC: 4.4 x10E6/uL (ref 4.14–5.80)
RDW: 13.4 % (ref 11.6–15.4)
WBC: 7.5 10*3/uL (ref 3.4–10.8)

## 2021-06-09 LAB — BASIC METABOLIC PANEL
BUN/Creatinine Ratio: 20 (ref 10–24)
BUN: 22 mg/dL (ref 8–27)
CO2: 28 mmol/L (ref 20–29)
Calcium: 9.3 mg/dL (ref 8.6–10.2)
Chloride: 105 mmol/L (ref 96–106)
Creatinine, Ser: 1.1 mg/dL (ref 0.76–1.27)
Glucose: 89 mg/dL (ref 70–99)
Potassium: 5.2 mmol/L (ref 3.5–5.2)
Sodium: 141 mmol/L (ref 134–144)
eGFR: 74 mL/min/{1.73_m2} (ref >59)

## 2021-06-12 ENCOUNTER — Other Ambulatory Visit (HOSPITAL_COMMUNITY): Payer: Self-pay | Admitting: Internal Medicine

## 2021-06-14 ENCOUNTER — Other Ambulatory Visit (HOSPITAL_COMMUNITY): Payer: BLUE CROSS/BLUE SHIELD

## 2021-06-14 ENCOUNTER — Encounter (HOSPITAL_COMMUNITY): Payer: BLUE CROSS/BLUE SHIELD | Admitting: Internal Medicine

## 2021-06-28 ENCOUNTER — Ambulatory Visit (HOSPITAL_COMMUNITY)
Admission: RE | Admit: 2021-06-28 | Discharge: 2021-06-28 | Disposition: A | Discharge status: Home / Self Care | Payer: Medicare Other | Source: Ambulatory Visit | Attending: Internal Medicine | Admitting: Internal Medicine

## 2021-06-28 VITALS — BP 122/76 | HR 55 | Wt 197.6 lb

## 2021-06-28 DIAGNOSIS — I480 Paroxysmal atrial fibrillation: Secondary | ICD-10-CM | POA: Diagnosis present

## 2021-06-28 DIAGNOSIS — Q231 Congenital insufficiency of aortic valve: Secondary | ICD-10-CM | POA: Insufficient documentation

## 2021-06-28 DIAGNOSIS — I110 Hypertensive heart disease with heart failure: Secondary | ICD-10-CM | POA: Insufficient documentation

## 2021-06-28 DIAGNOSIS — E119 Type 2 diabetes mellitus without complications: Secondary | ICD-10-CM | POA: Insufficient documentation

## 2021-06-28 DIAGNOSIS — Z01810 Encounter for preprocedural cardiovascular examination: Secondary | ICD-10-CM | POA: Diagnosis not present

## 2021-06-28 DIAGNOSIS — I10 Essential (primary) hypertension: Secondary | ICD-10-CM

## 2021-06-28 DIAGNOSIS — Z7901 Long term (current) use of anticoagulants: Secondary | ICD-10-CM | POA: Diagnosis not present

## 2021-06-28 DIAGNOSIS — Z952 Presence of prosthetic heart valve: Secondary | ICD-10-CM

## 2021-06-28 DIAGNOSIS — M5412 Radiculopathy, cervical region: Secondary | ICD-10-CM | POA: Insufficient documentation

## 2021-06-28 DIAGNOSIS — M4802 Spinal stenosis, cervical region: Secondary | ICD-10-CM | POA: Diagnosis not present

## 2021-06-28 DIAGNOSIS — G4733 Obstructive sleep apnea (adult) (pediatric): Secondary | ICD-10-CM | POA: Insufficient documentation

## 2021-06-28 DIAGNOSIS — Z981 Arthrodesis status: Secondary | ICD-10-CM | POA: Diagnosis not present

## 2021-06-28 DIAGNOSIS — Z953 Presence of xenogenic heart valve: Secondary | ICD-10-CM | POA: Diagnosis not present

## 2021-06-28 NOTE — Progress Notes (Signed)
Advanced Heart Failure Clinic Note    PCP: Plotnikov, Georgina Quint, MD   HPI:  Clifford Alvarez is a 66 y/o male with h/o HTN, DM2 with bicuspid aortic valve with dilated aortic root. Underwent Bentall aortic root replacement with bioprosthetic AVR on 01/10/19 at the [facility].  Pre-op cardiac CTA. No significant CAD Calcium score 11  Post-op course relatively uncomplicated. Had moderate pericardial effusion on post-op echo and started on colchicine. Resolved on f/u echo.   Echo 03/04/20 EF 60-65%. Normal diastolic function. RV ok. Bioprosthetic AVR ok mean gradient .   Had routine f/u echo in 3/23 EF read as 35-40% (I thought 45-50%) in setting of new AF/AFL. Underwent DC-CV on 04/08/21. Has seen Dr. Lalla Brothers regarding AF ablation which is scheduled for 07/01/21   Sleep study 5/23 with severe complex SDB OSA  44 CSA 24  In 5/23 underwent anterior cervical discectomies and fusion at C5-6 and C6-7 for progressive cervical stenosis and radiculopathy   Here for unscheduled f/u to discuss next steps in management of SDB in setting of upcoming AF ablatio and recent neck surgery. Says neck pain much better but still with minimal discomfort and able to sleep better. Walking 2-3x week walking 2-3.5 miles at a time. Following rhythm on [facility] and has been in NSR. Wife says he snores sometimes.   Past Medical History:  Diagnosis Date   Ascending aortic aneurysm (HCC)    5.4cm ascending aorta by Chest CT 12/2018, s/p thoracic aortic aneurysm repair with Biobentall 17mm Perimount Valve, 20mm graft   Bicuspid aortic valve    no AS or AI on echo 12/2018   CAD (coronary artery disease), native coronary artery    minimal CAD of the pLAD and diagonal with 0-24% stenosis   CHF (congestive heart failure) (HCC)    Coronary artery calcification seen on CAT scan    calcium score 11 12/2018   Diabetes mellitus without complication (HCC)    Dysrhythmia    Glaucoma    Heart murmur    History of  childhood heart murmur (Aortic)    Current Outpatient Medications  Medication Sig Dispense Refill   Alpha-Lipoic Acid 300 MG TABS Take 600 mg by mouth in the morning and at bedtime.     apixaban (ELIQUIS) 5 MG TABS tablet Take 1 tablet (5 mg total) by mouth 2 (two) times daily. 60 tablet 6   atorvastatin (LIPITOR) 40 MG tablet TAKE 1 TABLET BY MOUTH DAILY AT 6 PM. 90 tablet 3   B Complex-C (SUPER B COMPLEX/VITAMIN C) TABS Take 1 tablet by mouth daily.     Cholecalciferol (VITAMIN D3) 50 MCG (2000 UT) capsule TAKE 1 CAPSULE BY MOUTH EVERY DAY 90 capsule 3   dapagliflozin propanediol (FARXIGA) 5 MG TABS tablet Take 5 mg by mouth in the morning.     dorzolamide-timolol (COSOPT) 22.3-6.8 MG/ML ophthalmic solution Place 1 drop into both eyes 2 (two) times daily.     Lancets (ONETOUCH DELICA PLUS LANCET30G) MISC USE TO TEST BLOOD SUGAR 3 TIMES DAILY AS INSTRUCTED. 100 each 4   losartan (COZAAR) 50 MG tablet TAKE 1 TABLET BY MOUTH EVERY DAY (Patient taking differently: Take 50 mg by mouth daily.) 90 tablet 3   metFORMIN (GLUCOPHAGE) 1000 MG tablet TAKE 1 TABLET BY MOUTH TWICE A DAY WITH A MEAL 180 tablet 3   metoprolol succinate (TOPROL-XL) 25 MG 24 hr tablet TAKE 1 TABLET (25 MG TOTAL) BY MOUTH DAILY. (Patient taking differently: Take 25 mg by mouth every  evening.) 90 tablet 1   ONETOUCH VERIO test strip USE AS INSTRUCTED TO CHECK BLOOD SUGAR 2 TIMES A DAY E11.42 100 strip 1   No current facility-administered medications for this encounter.    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  Tobacco Use   Smoking status: Never   Smokeless tobacco: Never  Vaping Use   Vaping Use: Never  used  Substance and Sexual Activity   Alcohol use: No   Drug use: No   Sexual activity: Not on file  Other Topics Concern   Not on file  Social History Narrative   Married 38 years   Wife - Eunice Blase   Son 43   Daughter 41   Social Determinants of Corporate investment banker Strain: Not on Ship broker Insecurity: Not on file  Transportation Needs: Not on file  Physical Activity: Not on file  Stress: Not on file  Social Connections: Not on file  Intimate Partner Violence: Not on file      Family History  Problem Relation Age of Onset   Diabetes Mother    Hypertension Mother    Diabetes Father    Heart disease Father    Diabetes Brother    Diabetes Sister     Vitals:   06/28/21 1103  BP: 122/76  Pulse: (!) 55  SpO2: 98%  Weight: 89.6 kg (197 lb 9.6 oz)     PHYSICAL EXAM: General:  Well appearing. No resp difficulty HEENT: normal Neck: supple. no JVD. Carotids 2+ bilat; no bruits. No lymphadenopathy or thryomegaly appreciated. Cor: PMI nondisplaced. Regular rate & rhythm. 2/6 SEM RUSB S2 crisp Lungs: clear Abdomen: soft, nontender, nondistended. No hepatosplenomegaly. No bruits or masses. Good bowel sounds. Extremities: no cyanosis, clubbing, rash, edema Neuro: alert & orientedx3, cranial nerves grossly intact. moves all 4 extremities w/o difficulty. Affect pleasant    ASSESSMENT & PLAN:  1. Paroxysmal AFL/AF - asymptomatic. unknown duration.  - currently well rate-controlled but rate was 110-120 on echo yesterday - Echo 03/30/21 read as EF 35-40% but I think more like 45-50% with EF hard to assess due to rapid AFL - CHADVASc  =  Continue Eliquis & Toprol - Zio 4/23 100% AF mean VR = 73 - Underwent TEE/ DC-CV on 04/08/21. EF 45-50% Has seen Dr. Lalla Brothers regarding AF ablation which is scheduled for 07/01/21  - Sleep study 5/23 with severe complex SDB OSA  44 CSA 24  2. Bicuspid aortic valve with dilated aortic root.  - s/p Bentall aortic root replacement with  bioprosthetic AVR on 01/10/19 at the [facility] - Echo 03/04/20 EF 60-65. Normal diastolic function. RV ok. Bioprosthetic AVR ok mean gradient .  - Echo 03/30/21 AoV ok  - Aware of need for SBE prophylaxis - Off ASA 81 with Eliquis  3. HTN - Blood pressure well controlled. Continue current regimen.  4. Type 2 DM - controlled HgBA1c 5.8 followed by Dr. Marylouise Stacks - On Farxiga and atorva  5. Severe sleep apnea - we discussed mechanisms of SDB discussed at length. Suspect this is strongly related to his AF and will  need to be addressed to reduce risk of recurrence after ablation -Sleep study 5/23 with severe complex SDB OSA  44 CSA 24 - - will refer for in lab sleep study to confirm and possibly do split-night study - will refer back to Dr. Radford Pax as well   6. Cervical stenosis - 5/23 underwent anterior cervical discectomies and fusion at C5-6 and C6-7 for progressive cervical stenosis and radiculopathy   Total time spent 45 minutes. Over half that time spent discussing above.    Glori Bickers, MD 06/28/21

## 2021-06-30 ENCOUNTER — Encounter (HOSPITAL_COMMUNITY): Payer: Self-pay | Admitting: Cardiology

## 2021-06-30 ENCOUNTER — Telehealth: Payer: Self-pay | Admitting: Cardiology

## 2021-06-30 NOTE — Telephone Encounter (Signed)
New Message:     Patient says he is having an Ablation tomorrow morning. He says he have not received any instructions about his medicine.

## 2021-06-30 NOTE — Pre-Procedure Instructions (Signed)
Instructed patient on the following items: Arrival time 0530 Nothing to eat or drink after midnight No meds AM of procedure Responsible person to drive you home and stay with you for 24 hrs  Have you missed any doses of anti-coagulant Eliquis- hasn't missed any doses    

## 2021-06-30 NOTE — Telephone Encounter (Signed)
Helped patient locate his instructions in mychart and answered any questions.

## 2021-07-01 ENCOUNTER — Ambulatory Visit (HOSPITAL_COMMUNITY)
Admission: RE | Admit: 2021-07-01 | Discharge: 2021-07-01 | Disposition: A | Discharge status: Home / Self Care | Payer: Medicare Other | Source: Ambulatory Visit | Attending: Cardiology | Admitting: Cardiology

## 2021-07-01 ENCOUNTER — Encounter (HOSPITAL_COMMUNITY)
Admission: RE | Disposition: A | Discharge status: Home / Self Care | Payer: Self-pay | Source: Ambulatory Visit | Attending: Cardiology

## 2021-07-01 ENCOUNTER — Other Ambulatory Visit: Payer: Self-pay

## 2021-07-01 ENCOUNTER — Ambulatory Visit (HOSPITAL_BASED_OUTPATIENT_CLINIC_OR_DEPARTMENT_OTHER): Payer: Medicare Other | Admitting: Certified Registered Nurse Anesthetist

## 2021-07-01 ENCOUNTER — Ambulatory Visit (HOSPITAL_COMMUNITY): Payer: Medicare Other | Admitting: Certified Registered Nurse Anesthetist

## 2021-07-01 DIAGNOSIS — Z8679 Personal history of other diseases of the circulatory system: Secondary | ICD-10-CM | POA: Insufficient documentation

## 2021-07-01 DIAGNOSIS — I4819 Other persistent atrial fibrillation: Secondary | ICD-10-CM | POA: Diagnosis present

## 2021-07-01 DIAGNOSIS — I110 Hypertensive heart disease with heart failure: Secondary | ICD-10-CM | POA: Insufficient documentation

## 2021-07-01 DIAGNOSIS — I509 Heart failure, unspecified: Secondary | ICD-10-CM | POA: Insufficient documentation

## 2021-07-01 DIAGNOSIS — I2510 Atherosclerotic heart disease of native coronary artery without angina pectoris: Secondary | ICD-10-CM | POA: Insufficient documentation

## 2021-07-01 DIAGNOSIS — I4891 Unspecified atrial fibrillation: Secondary | ICD-10-CM

## 2021-07-01 DIAGNOSIS — I483 Typical atrial flutter: Secondary | ICD-10-CM | POA: Diagnosis not present

## 2021-07-01 DIAGNOSIS — Z7984 Long term (current) use of oral hypoglycemic drugs: Secondary | ICD-10-CM | POA: Diagnosis not present

## 2021-07-01 DIAGNOSIS — Z952 Presence of prosthetic heart valve: Secondary | ICD-10-CM | POA: Insufficient documentation

## 2021-07-01 DIAGNOSIS — E119 Type 2 diabetes mellitus without complications: Secondary | ICD-10-CM | POA: Diagnosis not present

## 2021-07-01 HISTORY — PX: ATRIAL FIBRILLATION ABLATION: EP1191

## 2021-07-01 LAB — GLUCOSE, CAPILLARY
Glucose-Capillary: 121 mg/dL — ABNORMAL HIGH (ref 70–99)
Glucose-Capillary: 163 mg/dL — ABNORMAL HIGH (ref 70–99)

## 2021-07-01 LAB — POCT ACTIVATED CLOTTING TIME
Activated Clotting Time: 263 seconds
Activated Clotting Time: 317 seconds

## 2021-07-01 SURGERY — ATRIAL FIBRILLATION ABLATION
Anesthesia: General

## 2021-07-01 MED ORDER — COLCHICINE 0.6 MG PO TABS
0.6000 mg | ORAL_TABLET | Freq: Two times a day (BID) | ORAL | Status: DC
  Administered 2021-07-01: 0.6 mg via ORAL
  Filled 2021-07-01: qty 1

## 2021-07-01 MED ORDER — MIDAZOLAM HCL 2 MG/2ML IJ SOLN
INTRAMUSCULAR | Status: DC | PRN
  Administered 2021-07-01: 2 mg via INTRAVENOUS

## 2021-07-01 MED ORDER — APIXABAN 5 MG PO TABS
5.0000 mg | ORAL_TABLET | Freq: Two times a day (BID) | ORAL | Status: DC
  Administered 2021-07-01: 5 mg via ORAL
  Filled 2021-07-01: qty 1

## 2021-07-01 MED ORDER — EPHEDRINE SULFATE (PRESSORS) 50 MG/ML IJ SOLN
INTRAMUSCULAR | Status: DC | PRN
  Administered 2021-07-01 (×2): 5 mg via INTRAVENOUS

## 2021-07-01 MED ORDER — HEPARIN SODIUM (PORCINE) 1000 UNIT/ML IJ SOLN
INTRAMUSCULAR | Status: AC
  Filled 2021-07-01: qty 10

## 2021-07-01 MED ORDER — PHENYLEPHRINE HCL-NACL 20-0.9 MG/250ML-% IV SOLN
INTRAVENOUS | Status: DC | PRN
  Administered 2021-07-01: 25 ug/min via INTRAVENOUS

## 2021-07-01 MED ORDER — HEPARIN SODIUM (PORCINE) 1000 UNIT/ML IJ SOLN
INTRAMUSCULAR | Status: DC | PRN
  Administered 2021-07-01: 13000 [IU] via INTRAVENOUS
  Administered 2021-07-01: 6000 [IU] via INTRAVENOUS
  Administered 2021-07-01: 3000 [IU] via INTRAVENOUS

## 2021-07-01 MED ORDER — SUGAMMADEX SODIUM 200 MG/2ML IV SOLN
INTRAVENOUS | Status: DC | PRN
Start: 2021-07-01 — End: 2021-07-01
  Administered 2021-07-01: 200 mg via INTRAVENOUS

## 2021-07-01 MED ORDER — PANTOPRAZOLE SODIUM 40 MG PO TBEC: 40.0000 mg | DELAYED_RELEASE_TABLET | Freq: Every day | ORAL | 0 refills | Status: DC

## 2021-07-01 MED ORDER — ONDANSETRON HCL 4 MG/2ML IJ SOLN
INTRAMUSCULAR | Status: DC | PRN
Start: 2021-07-01 — End: 2021-07-01
  Administered 2021-07-01: 4 mg via INTRAVENOUS

## 2021-07-01 MED ORDER — HEPARIN SODIUM (PORCINE) 1000 UNIT/ML IJ SOLN
INTRAMUSCULAR | Status: DC | PRN
  Administered 2021-07-01: 1000 [IU] via INTRAVENOUS

## 2021-07-01 MED ORDER — FENTANYL CITRATE (PF) 250 MCG/5ML IJ SOLN
INTRAMUSCULAR | Status: DC | PRN
  Administered 2021-07-01 (×2): 50 ug via INTRAVENOUS

## 2021-07-01 MED ORDER — PHENYLEPHRINE 80 MCG/ML (10ML) SYRINGE FOR IV PUSH (FOR BLOOD PRESSURE SUPPORT)
PREFILLED_SYRINGE | INTRAVENOUS | Status: DC | PRN
Start: 2021-07-01 — End: 2021-07-01
  Administered 2021-07-01 (×2): 80 ug via INTRAVENOUS

## 2021-07-01 MED ORDER — HEPARIN (PORCINE) IN NACL 1000-0.9 UT/500ML-% IV SOLN
INTRAVENOUS | Status: AC
  Filled 2021-07-01: qty 1500

## 2021-07-01 MED ORDER — VANCOMYCIN HCL IN DEXTROSE 1-5 GM/200ML-% IV SOLN
INTRAVENOUS | Status: AC
  Filled 2021-07-01: qty 200

## 2021-07-01 MED ORDER — LIDOCAINE 2% (20 MG/ML) 5 ML SYRINGE
INTRAMUSCULAR | Status: DC | PRN
Start: 2021-07-01 — End: 2021-07-01
  Administered 2021-07-01: 60 mg via INTRAVENOUS

## 2021-07-01 MED ORDER — PROTAMINE SULFATE 10 MG/ML IV SOLN
INTRAVENOUS | Status: DC | PRN
  Administered 2021-07-01: 10 mg via INTRAVENOUS
  Administered 2021-07-01: 25 mg via INTRAVENOUS

## 2021-07-01 MED ORDER — VANCOMYCIN HCL 1000 MG IV SOLR
INTRAVENOUS | Status: DC | PRN
  Administered 2021-07-01: 1000 mg via INTRAVENOUS

## 2021-07-01 MED ORDER — ONDANSETRON HCL 4 MG/2ML IJ SOLN: 4.0000 mg | Freq: Four times a day (QID) | INTRAMUSCULAR | Status: DC | PRN

## 2021-07-01 MED ORDER — HEPARIN (PORCINE) IN NACL 1000-0.9 UT/500ML-% IV SOLN
INTRAVENOUS | Status: DC | PRN
  Administered 2021-07-01 (×3): 500 mL

## 2021-07-01 MED ORDER — PANTOPRAZOLE SODIUM 40 MG PO TBEC
40.0000 mg | DELAYED_RELEASE_TABLET | Freq: Every day | ORAL | Status: DC
  Administered 2021-07-01: 40 mg via ORAL
  Filled 2021-07-01: qty 1

## 2021-07-01 MED ORDER — DEXAMETHASONE SODIUM PHOSPHATE 10 MG/ML IJ SOLN
INTRAMUSCULAR | Status: DC | PRN
  Administered 2021-07-01: 10 mg via INTRAVENOUS

## 2021-07-01 MED ORDER — ACETAMINOPHEN 325 MG PO TABS: 650.0000 mg | ORAL_TABLET | ORAL | Status: DC | PRN

## 2021-07-01 MED ORDER — PROPOFOL 10 MG/ML IV BOLUS
INTRAVENOUS | Status: DC | PRN
  Administered 2021-07-01: 30 mg via INTRAVENOUS
  Administered 2021-07-01: 140 mg via INTRAVENOUS

## 2021-07-01 MED ORDER — ROCURONIUM BROMIDE 10 MG/ML (PF) SYRINGE
PREFILLED_SYRINGE | INTRAVENOUS | Status: DC | PRN
Start: 2021-07-01 — End: 2021-07-01
  Administered 2021-07-01: 80 mg via INTRAVENOUS
  Administered 2021-07-01: 20 mg via INTRAVENOUS

## 2021-07-01 MED ORDER — SODIUM CHLORIDE 0.9 % IV SOLN: INTRAVENOUS | Status: DC

## 2021-07-01 MED ORDER — COLCHICINE 0.6 MG PO TABS: 0.6000 mg | ORAL_TABLET | Freq: Two times a day (BID) | ORAL | 0 refills | Status: DC

## 2021-07-01 SURGICAL SUPPLY — 18 items
BAG SNAP BAND KOVER 36X36 (MISCELLANEOUS) ×1 IMPLANT
BLANKET WARM UNDERBOD FULL ACC (MISCELLANEOUS) ×2 IMPLANT
CATH 8FR REPROCESSED SOUNDSTAR (CATHETERS) ×2 IMPLANT
CATH 8FR SOUNDSTAR REPROCESSED (CATHETERS) IMPLANT
CATH OCTARAY 2.0 F 3-3-3-3-3 (CATHETERS) ×1 IMPLANT
CATH S-M CIRCA TEMP PROBE (CATHETERS) ×1 IMPLANT
CATH SMTCH THERMOCOOL SF DF (CATHETERS) ×1 IMPLANT
CATH WEBSTER BI DIR CS D-F CRV (CATHETERS) ×1 IMPLANT
CLOSURE PERCLOSE PROSTYLE (VASCULAR PRODUCTS) ×3 IMPLANT
COVER SWIFTLINK CONNECTOR (BAG) ×2 IMPLANT
PACK EP LATEX FREE (CUSTOM PROCEDURE TRAY) ×1
PACK EP LF (CUSTOM PROCEDURE TRAY) ×1 IMPLANT
PAD DEFIB RADIO PHYSIO CONN (PAD) ×2 IMPLANT
SHEATH BAYLIS TRANSSEPTAL 98CM (NEEDLE) ×1 IMPLANT
SHEATH CARTO VIZIGO SM CVD (SHEATH) ×1 IMPLANT
SHEATH PINNACLE 8F 10CM (SHEATH) ×2 IMPLANT
SHEATH PINNACLE 9F 10CM (SHEATH) ×1 IMPLANT
SHEATH PROBE COVER 6X72 (BAG) ×2 IMPLANT

## 2021-07-01 NOTE — Anesthesia Preprocedure Evaluation (Signed)
Anesthesia Evaluation  Patient identified by MRN, date of birth, ID band Patient awake    Reviewed: Allergy & Precautions, NPO status , Patient's Chart, lab work & pertinent test results  History of Anesthesia Complications Negative for: history of anesthetic complications  Airway Mallampati: III  TM Distance: >3 FB Neck ROM: Limited    Dental  (+) Teeth Intact, Dental Advisory Given   Pulmonary neg pulmonary ROS,    breath sounds clear to auscultation       Cardiovascular hypertension, + CAD and +CHF  + dysrhythmias Atrial Fibrillation + Valvular Problems/Murmurs  Rhythm:Regular  1. Left ventricular ejection fraction, by estimation, is 45 to 50%. The  left ventricle has mildly decreased function. The left ventricle has no  regional wall motion abnormalities.  2. Right ventricular systolic function is mildly reduced. The right  ventricular size is normal.  3. Left atrial size was moderately dilated. No left atrial/left atrial  appendage thrombus was detected.  4. Right atrial size was moderately dilated.  5. The mitral valve is normal in structure. Trivial mitral valve  regurgitation. No evidence of mitral stenosis.  6. The aortic valve has been repaired/replaced. Aortic valve  regurgitation is not visualized. No aortic stenosis is present. There is a  unknown bioprosthetic valve present in the aortic position.  7. There is mild (Grade II) plaque involving the descending aorta.  8. The inferior vena cava is normal in size with greater than 50%  respiratory variability, suggesting right atrial pressure of 3 mmHg.    Neuro/Psych  Neuromuscular disease negative psych ROS   GI/Hepatic negative GI ROS, Neg liver ROS,   Endo/Other  diabetes  Renal/GU negative Renal ROSLab Results      Component                Value               Date                      CREATININE               1.10                06/09/2021                 Musculoskeletal negative musculoskeletal ROS (+)   Abdominal   Peds  Hematology  (+) Blood dyscrasia, , Lab Results      Component                Value               Date                      WBC                      7.5                 06/09/2021                HGB                      13.6                06/09/2021                HCT  40.8                06/09/2021                MCV                      93                  06/09/2021                PLT                      171                 06/09/2021            eliquis   Anesthesia Other Findings 5.4cm ascending aorta by Chest CT 12/2018, s/p thoracic aortic aneurysm repair with Biobentall 61mm Perimount Valve, 63mm graft  Reproductive/Obstetrics                             Anesthesia Physical Anesthesia Plan  ASA: 3  Anesthesia Plan:    Post-op Pain Management: Minimal or no pain anticipated   Induction: Intravenous  PONV Risk Score and Plan: 1 and Ondansetron and Dexamethasone  Airway Management Planned: Oral ETT  Additional Equipment: None  Intra-op Plan:   Post-operative Plan: Extubation in OR  Informed Consent: I have reviewed the patients History and Physical, chart, labs and discussed the procedure including the risks, benefits and alternatives for the proposed anesthesia with the patient or authorized representative who has indicated his/her understanding and acceptance.     Dental advisory given  Plan Discussed with: CRNA  Anesthesia Plan Comments:         Anesthesia Quick Evaluation

## 2021-07-01 NOTE — Discharge Instructions (Signed)

## 2021-07-01 NOTE — Anesthesia Postprocedure Evaluation (Signed)
Anesthesia Post Note  Patient: Clifford Alvarez  Procedure(s) Performed: ATRIAL FIBRILLATION ABLATION     Patient location during evaluation: PACU Anesthesia Type: General Level of consciousness: patient cooperative and awake Pain management: pain level controlled Vital Signs Assessment: post-procedure vital signs reviewed and stable Respiratory status: spontaneous breathing, nonlabored ventilation, respiratory function stable and patient connected to nasal cannula oxygen Cardiovascular status: blood pressure returned to baseline and stable Postop Assessment: no apparent nausea or vomiting Anesthetic complications: no   No notable events documented.  Last Vitals:  Vitals:   07/01/21 1350 07/01/21 1400  BP: (!) 145/83 133/61  Pulse: 60 (!) 53  Resp: 17 10  Temp:    SpO2: 98% 98%    Last Pain:  Vitals:   07/01/21 1115  TempSrc:   PainSc: 0-No pain                 Tabor Denham

## 2021-07-01 NOTE — Progress Notes (Signed)
Pt with level 2 hematoma, pt flat and manual pressure help. MD paged. MD stated to hold 30 minutes and he or the PA will be to bedside. 30 minutes held, site level 0, pt remaining flat, VSS.

## 2021-07-01 NOTE — H&P (Signed)
Electrophysiology Office Note:     Date:  07/01/2021    ID:  Clifford Alvarez, DOB November 16, 1955, MRN [id number]   PCP:  Tresa Garter, MD      [facility] Cardiologist:  Armanda Magic, MD  [facility] Electrophysiologist:  None    Referring MD: Tresa Garter, MD    Chief Complaint: Atrial fibrillation   History of Present Illness:     Clifford Alvarez is a 66 y.o. male who presents for an evaluation of atrial fibrillation at the request of Dr. Gala Romney. Their medical history includes hypertension, diabetes, bicuspid aortic valve post Bentall procedure January 10, 2019 including clinic.   He saw Dr. Gala Romney on March 31, 2021.  At that appointment he was in new onset atrial flutter that was asymptomatic with ventricular rates in the 110-120 range.  His echo performed while in atrial flutter showed a depressed ejection fraction at 35 to 40%.  Eliquis was started for stroke prophylaxis given a CHA2DS2-VASc of 3.  A Zio patch was placed to check for atrial fibrillation and a TEE cardioversion was arranged.   Today he is with his wife in clinic.  He tells me that when he is in atrial fibrillation he has noticed a slight increase in fatigue with exertion.  Overall though he does not have severe symptoms when he is in atrial fibrillation.  He tells me that he has a planned cervical spine surgery for radiculopathy.    Mr Tarango presents for PVI today.    Objective      Past Medical History:  Diagnosis Date   Ascending aortic aneurysm (HCC)      5.4cm ascending aorta by Chest CT 12/2018, s/p thoracic aortic aneurysm repair with Biobentall 63mm Perimount Valve, 93mm graft   Bicuspid aortic valve      no AS or AI on echo 12/2018   CAD (coronary artery disease), native coronary artery      minimal CAD of the pLAD and diagonal with 0-24% stenosis   CHF (congestive heart failure) (HCC)     Coronary artery calcification seen on CAT scan      calcium score 11 12/2018    Diabetes mellitus without complication (HCC)     Glaucoma     Heart murmur      History of childhood heart murmur (Aortic)           Past Surgical History:  Procedure Laterality Date   BENTALL PROCEDURE   01/10/2019    at [facility]   CARDIOVERSION N/A 04/08/2021    Procedure: CARDIOVERSION;  Surgeon: Dolores Patty, MD;  Location: [facility] ENDOSCOPY;  Service: Cardiovascular;  Laterality: N/A;   COLONOSCOPY   2006    Dr. Matthias Hughs   TEE WITHOUT CARDIOVERSION N/A 04/08/2021    Procedure: TRANSESOPHAGEAL ECHOCARDIOGRAM (TEE);  Surgeon: Dolores Patty, MD;  Location: [facility] ENDOSCOPY;  Service: Cardiovascular;  Laterality: N/A;      Current Medications: Active Medications      Current Meds  Medication Sig   Alpha-Lipoic Acid 300 MG TABS Take 600 mg by mouth in the morning and at bedtime.   apixaban (ELIQUIS) 5 MG TABS tablet Take 1 tablet (5 mg total) by mouth 2 (two) times daily.   atorvastatin (LIPITOR) 40 MG tablet TAKE 1 TABLET BY MOUTH DAILY AT 6 PM.   B Complex-C (SUPER B COMPLEX/VITAMIN C) TABS 1 tablet by mouth daily   Cholecalciferol (VITAMIN D3) 50 MCG (2000 UT) capsule TAKE 1 CAPSULE BY  MOUTH EVERY DAY   clindamycin (CLEOCIN) 300 MG capsule Take 600 mg by mouth See admin instructions. 600 mg before and after dental procedures   dorzolamide-timolol (COSOPT) 22.3-6.8 MG/ML ophthalmic solution Place 1 drop into both eyes 2 (two) times daily.   FARXIGA 10 MG TABS tablet Take 5 mg by mouth daily.   Lancets (ONETOUCH DELICA PLUS Q000111Q) MISC USE TO TEST BLOOD SUGAR 3 TIMES DAILY AS INSTRUCTED.   losartan (COZAAR) 50 MG tablet TAKE 1 TABLET BY MOUTH EVERY DAY   metFORMIN (GLUCOPHAGE) 1000 MG tablet TAKE 1 TABLET BY MOUTH TWICE A DAY WITH A MEAL   ONETOUCH VERIO test strip USE AS INSTRUCTED TO CHECK BLOOD SUGAR 2 TIMES A DAY E11.42   [DISCONTINUED] dapagliflozin propanediol (FARXIGA) 5 MG TABS tablet Take 1 tablet (5 mg total) by mouth daily.        Allergies:    Penicillins    Social History         Socioeconomic History   Marital status: Married      Spouse name: Clifford Alvarez   Number of children: 2   Years of education: Not on file   Highest education level: Not on file  Occupational History   Occupation: Self employed - Teacher, English as a foreign language      Comment: Programmer, multimedia  Tobacco Use   Smoking status: Never   Smokeless tobacco: Never  Substance and Sexual Activity   Alcohol use: No   Drug use: No   Sexual activity: Not on file  Other Topics Concern   Not on file  Social History Narrative    Married 86 years    Wife - Clifford Alvarez    Son 8    Daughter 33    Social Determinants of Adult nurse Strain: Not on Art therapist Insecurity: Not on file  Transportation Needs: Not on file  Physical Activity: Not on file  Stress: Not on file  Social Connections: Not on file      Family History: The patient's family history includes Diabetes in his brother, father, mother, and sister; Heart disease in his father; Hypertension in his mother.   ROS:   Please see the history of present illness.    All other systems reviewed and are negative.   EKGs/Labs/Other Studies Reviewed:     The following studies were reviewed today:   04/18/2021 Zio personally reviewed 100% burden of atrial fibrillation/flutter Rare ventricular ectopy Episodes of atrial fibrillation and atrial flutter noted during monitoring period.       April 08, 2021 transesophageal echo EF 45 to 50%, RV function mildly reduced Moderately dilated left atrium No left atrial appendage thrombus Bioprosthetic aortic valve       Recent Labs: 03/31/2021: ALT 18; BUN 15; Creatinine, Ser 1.03; Hemoglobin 14.3; Platelets 179; Potassium 4.5; Sodium 140; TSH 2.385  Recent Lipid Panel Labs (Brief)          Component Value Date/Time    CHOL 113 07/09/2019 1040    TRIG 102.0 07/09/2019 1040    HDL 37.20 (L) 07/09/2019 1040    CHOLHDL 3 07/09/2019 1040    VLDL 20.4 07/09/2019 1040     LDLCALC 56 07/09/2019 1040    LDLDIRECT 84.0 11/04/2018 0827        Physical Exam:     VS:  BP 148/73   Pulse 58   Ht 6\' 5"  (1.956 m)   Wt 196 lb 9.6 oz (89.2 kg)   SpO2 96%   BMI 23.31  kg/m         Wt Readings from Last 3 Encounters:  04/27/21 196 lb 9.6 oz (89.2 kg)  04/08/21 195 lb (88.5 kg)  03/31/21 199 lb 9.6 oz (90.5 kg)      GEN: Well nourished, well developed in no acute distress HEENT: Normal NECK: No JVD; No carotid bruits LYMPHATICS: No lymphadenopathy CARDIAC: RRR, no murmurs, rubs, gallops RESPIRATORY:  Clear to auscultation without rales, wheezing or rhonchi  ABDOMEN: Soft, non-tender, non-distended MUSCULOSKELETAL:  No edema; No deformity  SKIN: Warm and dry NEUROLOGIC:  Alert and oriented x 3 PSYCHIATRIC:  Normal affect          Assessment ASSESSMENT:     1. Persistent atrial fibrillation (HCC)   2. Typical atrial flutter (HCC)   3. S/P AVR (aortic valve replacement)   4. Bicuspid aortic valve     PLAN:     In order of problems listed above:   #Persistent atrial fibrillation and flutter Mild symptoms from his atrial fibrillation including dyspnea with exertion and slight decreased exercise tolerance.  He does have a depressed ejection fraction in the setting of rapidly conducted A-fib/flutter.  Rhythm control is indicated.     Risk, benefits, and alternatives to EP study and radiofrequency ablation for afib were also discussed in detail today. These risks include but are not limited to stroke, bleeding, vascular damage, tamponade, perforation, damage to the esophagus, lungs, and other structures, pulmonary vein stenosis, worsening renal function, and death. The patient understands these risk and wishes to proceed.  We will therefore proceed with catheter ablation at the next available time.  Carto, ICE, anesthesia are requested for the procedure.  Will also obtain CT PV protocol prior to the procedure to exclude LAA thrombus and further  evaluate atrial anatomy.     #History of bicuspid aortic valve post bioprosthetic AVR Prosthesis functioning appropriately.  Euvolemic on exam.      Presents for PVI today. Procedure reviewed.     Signed, Rossie Muskrat. Lalla Brothers, MD, [facility], [facility] 07/01/2021 Electrophysiology [facility]

## 2021-07-01 NOTE — Transfer of Care (Signed)
Immediate Anesthesia Transfer of Care Note  Patient: Clifford Alvarez  Procedure(s) Performed: ATRIAL FIBRILLATION ABLATION  Patient Location: Cath Lab  Anesthesia Type:General  Level of Consciousness: awake, alert  and oriented  Airway & Oxygen Therapy: Patient Spontanous Breathing  Post-op Assessment: Report given to RN and Post -op Vital signs reviewed and stable  Post vital signs: Reviewed and stable  Last Vitals:  Vitals Value Taken Time  BP 132/56 07/01/21 1006  Temp    Pulse 62 07/01/21 1006  Resp 14 07/01/21 1006  SpO2 97 % 07/01/21 1006  Vitals shown include unvalidated device data.  Last Pain:  Vitals:   07/01/21 0611  TempSrc:   PainSc: 0-No pain         Complications: No notable events documented.

## 2021-07-01 NOTE — Anesthesia Procedure Notes (Signed)
Procedure Name: Intubation Date/Time: 07/01/2021 7:54 AM  Performed by: Carolan Clines, CRNAPre-anesthesia Checklist: Patient identified, Emergency Drugs available, Suction available and Patient being monitored Patient Re-evaluated:Patient Re-evaluated prior to induction Oxygen Delivery Method: Circle System Utilized Preoxygenation: Pre-oxygenation with 100% oxygen Induction Type: IV induction Ventilation: Mask ventilation without difficulty Laryngoscope Size: Mac and 4 Grade View: Grade II Tube type: Oral Tube size: 7.5 mm Number of attempts: 1 Airway Equipment and Method: Stylet Placement Confirmation: ETT inserted through vocal cords under direct vision, positive ETCO2 and breath sounds checked- equal and bilateral Secured at: 22 cm Tube secured with: Tape Dental Injury: Teeth and Oropharynx as per pre-operative assessment  Comments: Grade IIb view with Mac 4.

## 2021-07-04 ENCOUNTER — Telehealth: Payer: Self-pay

## 2021-07-04 ENCOUNTER — Encounter (HOSPITAL_COMMUNITY): Payer: Self-pay | Admitting: Cardiology

## 2021-07-04 NOTE — Telephone Encounter (Signed)
Inbound fax from [facility] requesting clinical notes. Notes routed via Epic to [phone]

## 2021-07-07 ENCOUNTER — Other Ambulatory Visit: Payer: Self-pay | Admitting: Internal Medicine

## 2021-07-20 ENCOUNTER — Ambulatory Visit (INDEPENDENT_AMBULATORY_CARE_PROVIDER_SITE_OTHER): Payer: Medicare Other | Admitting: Internal Medicine

## 2021-07-20 ENCOUNTER — Encounter: Payer: Self-pay | Admitting: Internal Medicine

## 2021-07-20 DIAGNOSIS — E1142 Type 2 diabetes mellitus with diabetic polyneuropathy: Secondary | ICD-10-CM

## 2021-07-20 DIAGNOSIS — E559 Vitamin D deficiency, unspecified: Secondary | ICD-10-CM | POA: Diagnosis not present

## 2021-07-20 DIAGNOSIS — M5412 Radiculopathy, cervical region: Secondary | ICD-10-CM

## 2021-07-20 DIAGNOSIS — I2510 Atherosclerotic heart disease of native coronary artery without angina pectoris: Secondary | ICD-10-CM

## 2021-07-20 NOTE — Assessment & Plan Note (Signed)
On Vit D 

## 2021-07-20 NOTE — Assessment & Plan Note (Signed)
Dr Erroll Luna, Metformin Losartan - not taking, Lipitor

## 2021-07-20 NOTE — Assessment & Plan Note (Signed)
S/p surgery - F/u w/Dr Wynetta Emery

## 2021-07-20 NOTE — Assessment & Plan Note (Signed)
Doing well 

## 2021-07-20 NOTE — Progress Notes (Signed)
Subjective:  Patient ID: Clifford Alvarez, male    DOB: 02-19-1955  Age: 66 y.o. MRN: [id number]  CC: No chief complaint on file.   HPI Clifford Alvarez presents for A fib, cervical radiculopathy - better post-surgery, anticoagulation  Outpatient Medications Prior to Visit  Medication Sig Dispense Refill   Alpha-Lipoic Acid 300 MG TABS Take 600 mg by mouth in the morning and at bedtime.     apixaban (ELIQUIS) 5 MG TABS tablet Take 1 tablet (5 mg total) by mouth 2 (two) times daily. 60 tablet 6   atorvastatin (LIPITOR) 40 MG tablet TAKE 1 TABLET BY MOUTH DAILY AT 6 PM. 90 tablet 3   B Complex-C (SUPER B COMPLEX/VITAMIN C) TABS Take 1 tablet by mouth daily.     Cholecalciferol (VITAMIN D3) 50 MCG (2000 UT) capsule TAKE 1 CAPSULE BY MOUTH EVERY DAY 90 capsule 3   dorzolamide-timolol (COSOPT) 22.3-6.8 MG/ML ophthalmic solution Place 1 drop into both eyes 2 (two) times daily.     Lancets (ONETOUCH DELICA PLUS LANCET30G) MISC USE TO TEST BLOOD SUGAR 3 TIMES DAILY AS INSTRUCTED. 100 each 4   losartan (COZAAR) 50 MG tablet TAKE 1 TABLET BY MOUTH EVERY DAY (Patient taking differently: Take 50 mg by mouth daily.) 90 tablet 3   metFORMIN (GLUCOPHAGE) 1000 MG tablet TAKE 1 TABLET BY MOUTH TWICE A DAY WITH MEALS 180 tablet 3   metoprolol succinate (TOPROL-XL) 25 MG 24 hr tablet TAKE 1 TABLET (25 MG TOTAL) BY MOUTH DAILY. (Patient taking differently: Take 25 mg by mouth every evening.) 90 tablet 1   ONETOUCH VERIO test strip USE AS INSTRUCTED TO CHECK BLOOD SUGAR 2 TIMES A DAY E11.42 100 strip 1   pantoprazole (PROTONIX) 40 MG tablet Take 1 tablet (40 mg total) by mouth daily. 45 tablet 0   colchicine 0.6 MG tablet Take 1 tablet (0.6 mg total) by mouth 2 (two) times daily for 5 days. 10 tablet 0   dapagliflozin propanediol (FARXIGA) 5 MG TABS tablet Take 5 mg by mouth in the morning.     No facility-administered medications prior to visit.    ROS: Review of Systems  Constitutional:   Negative for appetite change, fatigue and unexpected weight change.  HENT:  Negative for congestion, nosebleeds, sneezing, sore throat and trouble swallowing.   Eyes:  Negative for itching and visual disturbance.  Respiratory:  Negative for cough.   Cardiovascular:  Negative for chest pain, palpitations and leg swelling.  Gastrointestinal:  Negative for abdominal distention, blood in stool, diarrhea and nausea.  Genitourinary:  Negative for frequency and hematuria.  Musculoskeletal:  Negative for back pain, gait problem, joint swelling and neck pain.  Skin:  Negative for rash.  Neurological:  Negative for dizziness, tremors, speech difficulty and weakness.  Psychiatric/Behavioral:  Negative for agitation, dysphoric mood, sleep disturbance and suicidal ideas. The patient is not nervous/anxious.     Objective:  BP 110/68 (BP Location: Left Arm, Patient Position: Sitting, Cuff Size: Normal)   Pulse (!) 58   Temp 98.2 F (36.8 C) (Oral)   Ht 6\' 5"  (1.956 m)   Wt 197 lb (89.4 kg)   SpO2 96%   BMI 23.36 kg/m   BP Readings from Last 3 Encounters:  07/20/21 110/68  07/01/21 (!) 120/57  06/28/21 122/76    Wt Readings from Last 3 Encounters:  07/20/21 197 lb (89.4 kg)  07/01/21 195 lb (88.5 kg)  06/28/21 197 lb 9.6 oz (89.6 kg)    Physical Exam  Constitutional:      General: He is not in acute distress.    Appearance: Normal appearance. He is well-developed.     Comments: NAD  Eyes:     Conjunctiva/sclera: Conjunctivae normal.     Pupils: Pupils are equal, round, and reactive to light.  Neck:     Thyroid: No thyromegaly.     Vascular: No JVD.  Cardiovascular:     Rate and Rhythm: Normal rate and regular rhythm.     Heart sounds: Normal heart sounds. No murmur heard.    No friction rub. No gallop.  Pulmonary:     Effort: Pulmonary effort is normal. No respiratory distress.     Breath sounds: Normal breath sounds. No wheezing or rales.  Chest:     Chest wall: No tenderness.   Abdominal:     General: Bowel sounds are normal. There is no distension.     Palpations: Abdomen is soft. There is no mass.     Tenderness: There is no abdominal tenderness. There is no guarding or rebound.  Musculoskeletal:        General: No tenderness. Normal range of motion.     Cervical back: Normal range of motion.  Lymphadenopathy:     Cervical: No cervical adenopathy.  Skin:    General: Skin is warm and dry.     Findings: No rash.  Neurological:     Mental Status: He is alert and oriented to person, place, and time.     Cranial Nerves: No cranial nerve deficit.     Motor: No abnormal muscle tone.     Coordination: Coordination normal.     Gait: Gait normal.     Deep Tendon Reflexes: Reflexes are normal and symmetric.  Psychiatric:        Behavior: Behavior normal.        Thought Content: Thought content normal.        Judgment: Judgment normal.     Lab Results  Component Value Date   WBC 7.5 06/09/2021   HGB 13.6 06/09/2021   HCT 40.8 06/09/2021   PLT 171 06/09/2021   GLUCOSE 89 06/09/2021   CHOL 118 06/06/2021   TRIG 145.0 06/06/2021   HDL 34.70 (L) 06/06/2021   LDLDIRECT 84.0 11/04/2018   LDLCALC 54 06/06/2021   ALT 18 03/31/2021   AST 20 03/31/2021   NA 141 06/09/2021   K 5.2 06/09/2021   CL 105 06/09/2021   CREATININE 1.10 06/09/2021   BUN 22 06/09/2021   CO2 28 06/09/2021   TSH 2.385 03/31/2021   PSA 0.72 07/09/2019   HGBA1C 6.1 (H) 05/12/2021   MICROALBUR <0.7 07/09/2019    EP STUDY  Addendum Date: 07/01/2021   CONCLUSIONS: 1. Successful PVI 2. Successful ablation/isolation of the posterior wall 3. Successful ablation of the cavotricuspid isthmus for typical atrial flutter 4. Intracardiac echo reveals trivial pericardial effusion, normal left atrial architecture 5. No early apparent complications. 6. Colchicine 0.6mg  PO BID x 5 days 7. Protonix 40mg  PO daily x 45 days  Result Date: 07/01/2021 CONCLUSIONS: 1. Successful PVI 2. Successful  ablation/isolation of the posterior wall 3. Successful ablation of the cavotricuspid isthmus for typical atrial flutter 4. Intracardiac echo reveals trivial pericardial effusion, normal left atrial architecture 5. No early apparent complications. 6. Colchicine 0.6mg  PO BID x 5 days 7. Protonix 40mg  PO daily x 45 days    Assessment & Plan:   Problem List Items Addressed This Visit     CAD (coronary artery disease),  native coronary artery    Doing well      Cervical radiculitis    S/p surgery - F/u w/Dr Wynetta Emery      Type 2 diabetes, controlled, with peripheral neuropathy (HCC) (Chronic)    Dr Erroll Luna, Metformin Losartan - not taking, Lipitor      Vitamin D deficiency    On Vit D         No orders of the defined types were placed in this encounter.     Follow-up: Return in about 6 months (around 01/20/2022) for Wellness Exam.  Sonda Primes, MD

## 2021-07-22 ENCOUNTER — Ambulatory Visit (HOSPITAL_COMMUNITY)
Admission: RE | Admit: 2021-07-22 | Discharge: 2021-07-22 | Disposition: A | Discharge status: Home / Self Care | Payer: Medicare Other | Source: Ambulatory Visit | Attending: Physician Assistant | Admitting: Physician Assistant

## 2021-07-22 ENCOUNTER — Encounter (HOSPITAL_COMMUNITY): Payer: Self-pay | Admitting: Physician Assistant

## 2021-07-22 VITALS — BP 100/68 | HR 56 | Ht 77.0 in | Wt 199.6 lb

## 2021-07-22 DIAGNOSIS — I10 Essential (primary) hypertension: Secondary | ICD-10-CM | POA: Diagnosis not present

## 2021-07-22 DIAGNOSIS — G4733 Obstructive sleep apnea (adult) (pediatric): Secondary | ICD-10-CM | POA: Diagnosis not present

## 2021-07-22 DIAGNOSIS — I4892 Unspecified atrial flutter: Secondary | ICD-10-CM | POA: Diagnosis present

## 2021-07-22 DIAGNOSIS — D6869 Other thrombophilia: Secondary | ICD-10-CM | POA: Diagnosis present

## 2021-07-22 DIAGNOSIS — I4819 Other persistent atrial fibrillation: Secondary | ICD-10-CM | POA: Diagnosis present

## 2021-07-22 NOTE — Progress Notes (Signed)
Primary Care Physician: Tresa Garter, MD Primary Cardiologist: Dr Mayford Knife Primary Electrophysiologist: Dr Lalla Brothers Referring Physician: Dr Lalla Brothers [facility]: Dr Mitchel Honour Clifford Alvarez is a 66 y.o. male with a history of HTN, DM, OSA, systolic CHF, bicuspid AV s/p Bentall, atrial fibrillation who presents for follow up in the [facility]. Patient saw Dr. Gala Romney on March 31, 2021.  At that appointment he was in new onset atrial flutter that was asymptomatic with ventricular rates in the 110-120 range.  His echo performed while in atrial flutter showed a depressed ejection fraction at 35 to 40%. Patient was started on Eliquis for a CHADS2VASC score of 4. He wore a cardiac monitor which showed 100% atrial fibrillation. He underwent afib and flutter ablation with Dr Lalla Brothers on 07/01/21.  On follow up today, patient reports that he has done well since the ablation. He has not had any afib or flutter noted on his Kardia mobile. He denies CP, swallowing pain, or groin issues. No bleeding issues on anticoagulation.   Today, he denies symptoms of palpitations, chest pain, shortness of breath, orthopnea, PND, lower extremity edema, dizziness, presyncope, syncope, bleeding, or neurologic sequela. The patient is tolerating medications without difficulties and is otherwise without complaint today.    Atrial Fibrillation Risk Factors:  he does have symptoms or diagnosis of sleep apnea. he does not have a history of rheumatic fever.   he has a BMI of Body mass index is 23.67 kg/m.Marland Kitchen Filed Weights   07/22/21 0858  Weight: 90.5 kg    Family History  Problem Relation Age of Onset   Diabetes Mother    Hypertension Mother    Diabetes Father    Heart disease Father    Diabetes Brother    Diabetes Sister      Atrial Fibrillation Management history:  Previous antiarrhythmic drugs: none Previous cardioversions: 04/08/21 Previous ablations:  07/01/21 CHADS2VASC score: 4 Anticoagulation history: Eliquis   Past Medical History:  Diagnosis Date   Ascending aortic aneurysm (HCC)    5.4cm ascending aorta by Chest CT 12/2018, s/p thoracic aortic aneurysm repair with Biobentall 59mm Perimount Valve, 8mm graft   Bicuspid aortic valve    no AS or AI on echo 12/2018   CAD (coronary artery disease), native coronary artery    minimal CAD of the pLAD and diagonal with 0-24% stenosis   CHF (congestive heart failure) (HCC)    Coronary artery calcification seen on CAT scan    calcium score 11 12/2018   Diabetes mellitus without complication (HCC)    Dysrhythmia    Glaucoma    Heart murmur    History of childhood heart murmur (Aortic)   Past Surgical History:  Procedure Laterality Date   ANTERIOR CERVICAL DECOMP/DISCECTOMY FUSION N/A 05/13/2021   Procedure: Anterior Cervical Discetomy Fusion - Cervical five-Cervical six - Cervical six-Cervical seven;  Surgeon: Donalee Citrin, MD;  Location: [facility] OR;  Service: Neurosurgery;  Laterality: N/A;   ATRIAL FIBRILLATION ABLATION N/A 07/01/2021   Procedure: ATRIAL FIBRILLATION ABLATION;  Surgeon: Lanier Prude, MD;  Location: [facility] INVASIVE CV LAB;  Service: Cardiovascular;  Laterality: N/A;   BENTALL PROCEDURE  01/10/2019   at [facility]   CARDIOVERSION N/A 04/08/2021   Procedure: CARDIOVERSION;  Surgeon: Dolores Patty, MD;  Location: [facility] ENDOSCOPY;  Service: Cardiovascular;  Laterality: N/A;   COLONOSCOPY  2006   Dr. Matthias Hughs   EYE SURGERY     TEE WITHOUT CARDIOVERSION N/A 04/08/2021   Procedure:  TRANSESOPHAGEAL ECHOCARDIOGRAM (TEE);  Surgeon: Dolores Patty, MD;  Location: [facility] ENDOSCOPY;  Service: Cardiovascular;  Laterality: N/A;   TONSILLECTOMY      Current Outpatient Medications  Medication Sig Dispense Refill   Alpha-Lipoic Acid 300 MG TABS Take 600 mg by mouth in the morning and at bedtime.     apixaban (ELIQUIS) 5 MG TABS tablet Take 1 tablet (5 mg total) by mouth 2  (two) times daily. 60 tablet 6   atorvastatin (LIPITOR) 40 MG tablet TAKE 1 TABLET BY MOUTH DAILY AT 6 PM. 90 tablet 3   B Complex-C (SUPER B COMPLEX/VITAMIN C) TABS Take 1 tablet by mouth daily.     Cholecalciferol (VITAMIN D3) 50 MCG (2000 UT) capsule TAKE 1 CAPSULE BY MOUTH EVERY DAY 90 capsule 3   dorzolamide-timolol (COSOPT) 22.3-6.8 MG/ML ophthalmic solution Place 1 drop into both eyes 2 (two) times daily.     Lancets (ONETOUCH DELICA PLUS LANCET30G) MISC USE TO TEST BLOOD SUGAR 3 TIMES DAILY AS INSTRUCTED. 100 each 4   losartan (COZAAR) 50 MG tablet TAKE 1 TABLET BY MOUTH EVERY DAY 90 tablet 3   metFORMIN (GLUCOPHAGE) 1000 MG tablet TAKE 1 TABLET BY MOUTH TWICE A DAY WITH MEALS 180 tablet 3   metoprolol succinate (TOPROL-XL) 25 MG 24 hr tablet TAKE 1 TABLET (25 MG TOTAL) BY MOUTH DAILY. 90 tablet 1   ONETOUCH VERIO test strip USE AS INSTRUCTED TO CHECK BLOOD SUGAR 2 TIMES A DAY E11.42 100 strip 1   pantoprazole (PROTONIX) 40 MG tablet Take 1 tablet (40 mg total) by mouth daily. 45 tablet 0   No current facility-administered medications for this encounter.    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  Tobacco Use   Smoking status: Never   Smokeless tobacco: Never   Tobacco comments:    Never smoke 07/22/21  Vaping Use   Vaping Use: Never used  Substance and Sexual Activity   Alcohol use: No   Drug use: No   Sexual activity: Not on file  Other Topics Concern   Not on file  Social History Narrative   Married 38 years   Wife - Barbarann Ehlers 28   Daughter 54   Social Determinants of Corporate investment banker Strain: Not on [employer] Insecurity: Not on file   Transportation Needs: Not on file  Physical Activity: Not on file  Stress: Not on file  Social Connections: Not on file  Intimate Partner Violence: Not on file     ROS- All systems are reviewed and negative except as per the HPI above.  Physical Exam: Vitals:   07/22/21 0858  BP: 100/68  Pulse: (!) 56  Weight: 90.5 kg  Height: 6\' 5"  (1.956 m)    GEN- The patient is a well appearing male, alert and oriented x 3 today.   Head- normocephalic, atraumatic Eyes-  Sclera clear, conjunctiva pink Ears- hearing intact Oropharynx- clear Neck- supple  Lungs- Clear to ausculation bilaterally, normal work of breathing Heart- Regular rate and rhythm, no murmurs, rubs or gallops  GI- soft, NT, ND, + BS Extremities-  no clubbing, cyanosis, or edema MS- no significant deformity or atrophy Skin- no rash or lesion Psych- euthymic mood, full affect Neuro- strength and sensation are intact  Wt Readings from Last 3 Encounters:  07/22/21 90.5 kg  07/20/21 89.4 kg  07/01/21 88.5 kg    EKG today demonstrates  SB, RBBB Vent. rate 56 BPM PR interval 180 ms QRS duration 126 ms QT/QTcB 444/428 ms  Echo 03/30/21 demonstrated   1. The apex of the heart is hyperdynamic nad the base is hypokinetic.  Left ventricular ejection fraction, by estimation, is 35 to 40%. The left  ventricle has moderately decreased function. The left ventricle  demonstrates global hypokinesis. There is mild left ventricular hypertrophy. Left ventricular diastolic function could not be evaluated.   2. Left atrial size was mildly dilated.   3. The mitral valve is abnormal. Mild mitral valve regurgitation.   4. The aortic valve was not well visualized. Aortic valve regurgitation  is not visualized. Mild aortic valve stenosis. Aortic valve area, by VTI  measures 1.56 cm. Aortic valve mean gradient measures 12.2 mmHg. Aortic valve Vmax measures 2.38 m/s.   5. The inferior vena cava is normal in size with <50% respiratory   variability, suggesting right atrial pressure of 8 mmHg.   6. Rhythm strip during this exam appears to demonstrate atrial flutter  with variable ventricular response.   7. Right ventricular systolic function is low normal. The right  ventricular size is normal.   Comparison(s): Changes from prior study are noted. 03/04/2020: LVEF 60-65%.   Epic records are reviewed at length today  CHA2DS2-VASc Score = 4  The patient's score is based upon: CHF History: 1 HTN History: 1 Diabetes History: 1 Stroke History: 0 Vascular Disease History: 0 Age Score: 1 Gender Score: 0       ASSESSMENT AND PLAN: 1. Persistent Atrial Fibrillation/atrial flutter The patient's CHA2DS2-VASc score is 4, indicating a 4.8% annual risk of stroke.   S/p afib and flutter ablation 07/01/21 Patient appears to be maintaining SR. Continue Eliquis 5 mg BID with no missed doses for 3 months post ablation. Continue Toprol 25 mg daily Kardia Mobile for home monitoring.   2. Secondary Hypercoagulable State (ICD10:  D68.69) The patient is at significant risk for stroke/thromboembolism based upon his CHA2DS2-VASc Score of 4.  Continue Apixaban (Eliquis).   3. Bicuspid AV S/p Bentall 2021 at [facility].  4. Obstructive sleep apnea The importance of adequate treatment of sleep apnea was discussed today in order to improve our ability to maintain sinus rhythm long term. Sleep study completed 05/10/21. Has not had CPAP titration yet.  5. HTN Stable, no changes today.  6. Systolic dysfunction  EF 35-40% Appears euvolemic Followed in [facility]   Follow up with Dr Gala Romney and Dr Lalla Brothers as scheduled.    Jorja Loa PA-C Afib Clinic [facility] [address] [place], [place] [address] [phone] 07/22/2021 9:32 AM

## 2021-07-25 ENCOUNTER — Ambulatory Visit: Payer: Medicare Other | Admitting: Internal Medicine

## 2021-07-29 ENCOUNTER — Ambulatory Visit (HOSPITAL_COMMUNITY): Payer: Medicare Other | Admitting: Physician Assistant

## 2021-08-03 ENCOUNTER — Ambulatory Visit (HOSPITAL_BASED_OUTPATIENT_CLINIC_OR_DEPARTMENT_OTHER): Payer: Medicare Other | Attending: Cardiology | Admitting: Cardiology

## 2021-08-03 DIAGNOSIS — G4733 Obstructive sleep apnea (adult) (pediatric): Secondary | ICD-10-CM | POA: Insufficient documentation

## 2021-08-06 NOTE — Procedures (Signed)
   Patient Name: Clifford Alvarez, Clifford Alvarez Date: 08/03/2021 Gender: Male D.O.B: 08-12-1955 Age (years): 36 Referring Provider: Armanda Magic MD, ABSM Height (inches): 77 Interpreting Physician: Armanda Magic MD, ABSM Weight (lbs): 195 RPSGT: Shelah Lewandowsky BMI: 23 MRN: [id number] Neck Size: 15.50  CLINICAL INFORMATION The patient is referred for a CPAP titration to treat sleep apnea.  SLEEP STUDY TECHNIQUE As per the AASM Manual for the Scoring of Sleep and Associated Events v2.3 (April 2016) with a hypopnea requiring 4% desaturations.  The channels recorded and monitored were frontal, central and occipital EEG, electrooculogram (EOG), submentalis EMG (chin), nasal and oral airflow, thoracic and abdominal wall motion, anterior tibialis EMG, snore microphone, electrocardiogram, and pulse oximetry. Continuous positive airway pressure (CPAP) was initiated at the beginning of the study and titrated to treat sleep-disordered breathing.  MEDICATIONS Medications self-administered by patient taken the night of the study : N/A  TECHNICIAN COMMENTS Comments added by technician: None Comments added by scorer: N/A  RESPIRATORY PARAMETERS Optimal PAP Pressure (cm): 7  AHI at Optimal Pressure (/hr):1.3 Overall Minimal O2 (%):94.0  Supine % at Optimal Pressure (%):100 Minimal O2 at Optimal Pressure (%): 95.0   SLEEP ARCHITECTURE The study was initiated at 11:05:22 PM and ended at 5:14:06 AM.  Sleep onset time was 10.5 minutes and the sleep efficiency was 65.4%. The total sleep time was 241 minutes.  The patient spent 5.6% of the night in stage N1 sleep, 73.4% in stage N2 sleep, 0.0% in stage N3 and 21% in REM.Stage REM latency was 37.5 minutes  Wake after sleep onset was 117.3. Alpha intrusion was absent. Supine sleep was 62.83%.  CARDIAC DATA The 2 lead EKG demonstrated sinus rhythm. The mean heart rate was 49.0 beats per minute. Other EKG findings include: None.  LEG MOVEMENT DATA The  total Periodic Limb Movements of Sleep (PLMS) were 0. The PLMS index was 0.0. A PLMS index of <15 is considered normal in adults.  IMPRESSIONS - The optimal PAP pressure was 7 cm of water. - Central sleep apnea was not noted during this titration (CAI = 0.7/h). - Significant oxygen desaturations were not observed during this titration (min O2 = 94.0%). - The patient snored with moderate snoring volume during this titration study. - No cardiac abnormalities were observed during this study. - Clinically significant periodic limb movements were not noted during this study. Arousals associated with PLMs were rare.  DIAGNOSIS - Obstructive Sleep Apnea (G47.33)  RECOMMENDATIONS - Trial of ResMed CPAP therapy on 7 cm H2O with a Medium size Resmed Full Face AirFit F20 mask and heated humidification. - Avoid alcohol, sedatives and other CNS depressants that may worsen sleep apnea and disrupt normal sleep architecture. - Sleep hygiene should be reviewed to assess factors that may improve sleep quality. - Weight management and regular exercise should be initiated or continued. - Return to Sleep Center for re-evaluation after 6 weeks of therapy  [Electronically signed] 08/06/2021 06:39 PM  Armanda Magic MD, ABSM Diplomate, American Board of Sleep Medicine

## 2021-08-25 ENCOUNTER — Telehealth: Payer: Self-pay | Admitting: *Deleted

## 2021-08-25 DIAGNOSIS — I10 Essential (primary) hypertension: Secondary | ICD-10-CM

## 2021-08-25 DIAGNOSIS — I2510 Atherosclerotic heart disease of native coronary artery without angina pectoris: Secondary | ICD-10-CM

## 2021-08-25 DIAGNOSIS — G4733 Obstructive sleep apnea (adult) (pediatric): Secondary | ICD-10-CM

## 2021-08-25 NOTE — Telephone Encounter (Signed)
The patient has been notified of the result and verbalized understanding.  All questions (if any) were answered. Clifford Alvarez, CMA 08/25/2021 6:01 PM    Patient declined cpap setup stating he does not feel the test was accurate he will speak to his provider and make a decision at that time.

## 2021-08-31 NOTE — Addendum Note (Signed)
Addended by: Reesa Chew on: 08/31/2021 03:49 PM   Modules accepted: Orders

## 2021-08-31 NOTE — Telephone Encounter (Signed)
Patient called back to say he changed his mind and has decided to move forward with the cpap setup.  RECOMMENDATIONS - Trial of ResMed CPAP therapy on 7 cm H2O with a Medium size Resmed Full Face AirFit F20 mask and heated humidification.  Upon patient request DME selection is [facility]. Patient understands he will be contacted by [facility] to set up his cpap. Patient understands to call if [facility] does not contact him with new setup in a timely manner. Patient understands they will be called once confirmation has been received from [facility]/ that they have received their new machine to schedule 10 week follow up appointment.   [facility] notified of new cpap order  Please add to airview Patient was grateful for the call and thanked me.

## 2021-09-30 ENCOUNTER — Telehealth: Payer: Self-pay | Admitting: *Deleted

## 2021-09-30 NOTE — Telephone Encounter (Signed)
Reached out to the patient to help resolve his issue and patient states everything is ok. I asked if he could find the incorrect statement in his mychart so I could review it in his chart with him but he said he has not been back in his mychart since he saw it there. Patient said he needed to leave for a covid shot appointment so I let him go. Thanks

## 2021-09-30 NOTE — Telephone Encounter (Signed)
-----   Message from Sueanne Margarita, MD sent at 09/25/2021 11:09 AM EDT ----- Regarding: RE: Zayante I am unclear what the patient is questioning.  He had a PAP titration in the sleep lab and a physician is not present during that.  I read the study and recommended CPAP and sent the order to my sleep Nurse Sydell Axon to call patient to let them know results and get followup set up.    Gae Bon did you call patient with his results and set up followup?  Traci ----- Message ----- From: Danie Binder Sent: 09/23/2021  10:43 AM EDT To: Sueanne Margarita, MD Subject: CHART CORRECTION REQUEST FROM PATIENT          Msg sent date:  09/23/21     time:  10:41 am   Hello Sueanne Margarita, MD,                                                                                         Please see Chart Correction request below from patient Clifford Alvarez, Clifford Alvarez.    Have you had a chance to review the patient Chart Correction?   The patient is waiting on a response.  Thank you     Chart Correction Request - Visit Summary (Newest Message First) Clifford Alvarez "Clifford Alvarez" P Chl Him Roi 11 hours ago (9:27 PM) Topic:   Form Title: Chart Correction Request - Visit Summary Submitted Data ----------------------------------------   This patient has requested a chart correction to a visit summary. Visit summary to change: Sleep Study Visit date: Wed 08/03/2021 Current incorrect information: I never saw Dr Radford Pax. Upon completion of the study, I saw the Notes included on My Chart but have not heard from Dr Radford Pax (nor anybody else) regarding interpreting/discussing the results nor any follow-up protocols.   Clifford Alvarez Chart correction requested: I never saw Dr Radford Pax. Upon completion of the study, I saw the Notes included on My Chart but have not heard from Dr Radford Pax (nor anybody else) regarding interpreting/discussing the results nor any follow-up protocols.    Clifford Alvarez Close

## 2021-10-06 ENCOUNTER — Ambulatory Visit (HOSPITAL_COMMUNITY)
Admission: RE | Admit: 2021-10-06 | Discharge: 2021-10-06 | Disposition: A | Discharge status: Home / Self Care | Payer: Medicare Other | Source: Ambulatory Visit | Attending: Internal Medicine | Admitting: Internal Medicine

## 2021-10-06 ENCOUNTER — Encounter (HOSPITAL_COMMUNITY): Payer: Self-pay | Admitting: Internal Medicine

## 2021-10-06 VITALS — BP 104/72 | HR 61 | Wt 202.8 lb

## 2021-10-06 DIAGNOSIS — Z952 Presence of prosthetic heart valve: Secondary | ICD-10-CM | POA: Diagnosis not present

## 2021-10-06 DIAGNOSIS — Z79899 Other long term (current) drug therapy: Secondary | ICD-10-CM | POA: Insufficient documentation

## 2021-10-06 DIAGNOSIS — E119 Type 2 diabetes mellitus without complications: Secondary | ICD-10-CM | POA: Insufficient documentation

## 2021-10-06 DIAGNOSIS — G4733 Obstructive sleep apnea (adult) (pediatric): Secondary | ICD-10-CM | POA: Diagnosis not present

## 2021-10-06 DIAGNOSIS — Z981 Arthrodesis status: Secondary | ICD-10-CM | POA: Insufficient documentation

## 2021-10-06 DIAGNOSIS — I2510 Atherosclerotic heart disease of native coronary artery without angina pectoris: Secondary | ICD-10-CM | POA: Diagnosis not present

## 2021-10-06 DIAGNOSIS — Z7901 Long term (current) use of anticoagulants: Secondary | ICD-10-CM | POA: Diagnosis not present

## 2021-10-06 DIAGNOSIS — Z953 Presence of xenogenic heart valve: Secondary | ICD-10-CM | POA: Diagnosis not present

## 2021-10-06 DIAGNOSIS — I4891 Unspecified atrial fibrillation: Secondary | ICD-10-CM | POA: Diagnosis not present

## 2021-10-06 DIAGNOSIS — I4892 Unspecified atrial flutter: Secondary | ICD-10-CM | POA: Diagnosis not present

## 2021-10-06 DIAGNOSIS — I509 Heart failure, unspecified: Secondary | ICD-10-CM | POA: Diagnosis not present

## 2021-10-06 DIAGNOSIS — I110 Hypertensive heart disease with heart failure: Secondary | ICD-10-CM | POA: Diagnosis not present

## 2021-10-06 DIAGNOSIS — I480 Paroxysmal atrial fibrillation: Secondary | ICD-10-CM | POA: Diagnosis present

## 2021-10-06 DIAGNOSIS — I10 Essential (primary) hypertension: Secondary | ICD-10-CM | POA: Diagnosis not present

## 2021-10-06 DIAGNOSIS — Z7984 Long term (current) use of oral hypoglycemic drugs: Secondary | ICD-10-CM | POA: Diagnosis not present

## 2021-10-06 MED ORDER — LOSARTAN POTASSIUM 25 MG PO TABS: 25.0000 mg | ORAL_TABLET | Freq: Every day | ORAL | 3 refills | Status: DC

## 2021-10-06 NOTE — Patient Instructions (Signed)
DECREASE Losartan to 25 mg daily  Your physician has requested that you have an echocardiogram. Echocardiography is a painless test that uses sound waves to create images of your heart. It provides your doctor with information about the size and shape of your heart and how well your heart's chambers and valves are working. This procedure takes approximately one hour. There are no restrictions for this procedure.  Your physician recommends that you schedule a follow-up appointment in: 12 months with  Echocardiogram OCt 2024  Call office in August to schedule an appointment  If you have any questions or concerns before your next appointment please send [place] a message through [place] or call our office at [phone].    TO LEAVE A MESSAGE FOR THE NURSE SELECT OPTION 2, PLEASE LEAVE A MESSAGE INCLUDING: YOUR NAME DATE OF BIRTH CALL BACK NUMBER REASON FOR CALL**this is important as we prioritize the call backs  YOU WILL RECEIVE A CALL BACK THE SAME DAY AS LONG AS YOU CALL BEFORE 4:00 PM  At the [facility], you and your health needs are our priority. As part of our continuing mission to provide you with exceptional heart care, we have created designated Provider Care Teams. These Care Teams include your primary Cardiologist (physician) and Advanced Practice Providers (APPs- Physician Assistants and Nurse Practitioners) who all work together to provide you with the care you need, when you need it.   You may see any of the following providers on your designated Care Team at your next follow up: Dr Glori Bickers Dr Loralie Champagne Dr. Roxana Hires, NP Lyda Jester, [place] [facility] [place], [place] Forestine Na, NP Audry Riles, PharmD   Please be sure to bring in all your medications bottles to every appointment.

## 2021-10-06 NOTE — Progress Notes (Signed)
Advanced Heart Failure Clinic Note    PCP: Plotnikov, Georgina Quint, MD   HPI:  Clifford Alvarez is a 66 y/o male with h/o HTN, DM2 with bicuspid aortic valve with dilated aortic root. Underwent Bentall aortic root replacement with bioprosthetic AVR on 01/10/19 at the [facility].  Pre-op cardiac CTA. No significant CAD Calcium score 11  Post-op course relatively uncomplicated. Had moderate pericardial effusion on post-op echo and started on colchicine. Resolved on f/u echo.   Echo 03/04/20 EF 60-65%. Normal diastolic function. RV ok. Bioprosthetic AVR ok mean gradient .   Had routine f/u echo in 3/23 EF read as 35-40% (I thought 45-50%) in setting of new AF/AFL. Underwent DC-CV on 04/08/21.   Sleep study 5/23 with severe complex SDB OSA  44 CSA 24  In 5/23 underwent anterior cervical discectomies and fusion at C5-6 and C6-7 for progressive cervical stenosis and radiculopathy   Underwent AF ablation with Dr. Lalla Brothers 07/01/21   Here with his wife. Doing well. No further AF. Got CPAP about 10 days ago. Using nasal pillows. Denies CP or SOB. Denies SOB, orthopnea or PND.   SBP at home 110-120   Past Medical History:  Diagnosis Date   Ascending aortic aneurysm (HCC)    5.4cm ascending aorta by Chest CT 12/2018, s/p thoracic aortic aneurysm repair with Biobentall 75mm Perimount Valve, 37mm graft   Bicuspid aortic valve    no AS or AI on echo 12/2018   CAD (coronary artery disease), native coronary artery    minimal CAD of the pLAD and diagonal with 0-24% stenosis   CHF (congestive heart failure) (HCC)    Coronary artery calcification seen on CAT scan    calcium score 11 12/2018   Diabetes mellitus without complication (HCC)    Dysrhythmia    Glaucoma    Heart murmur    History of childhood heart murmur (Aortic)    Current Outpatient Medications  Medication Sig Dispense Refill   Alpha-Lipoic Acid 300 MG TABS Take 600 mg by mouth in the morning and at bedtime.     apixaban (ELIQUIS)  5 MG TABS tablet Take 1 tablet (5 mg total) by mouth 2 (two) times daily. 60 tablet 6   atorvastatin (LIPITOR) 40 MG tablet TAKE 1 TABLET BY MOUTH DAILY AT 6 PM. 90 tablet 3   B Complex-C (SUPER B COMPLEX/VITAMIN C) TABS Take 1 tablet by mouth daily.     Cholecalciferol (VITAMIN D3) 50 MCG (2000 UT) capsule TAKE 1 CAPSULE BY MOUTH EVERY DAY 90 capsule 3   dorzolamide-timolol (COSOPT) 22.3-6.8 MG/ML ophthalmic solution Place 1 drop into both eyes 2 (two) times daily.     Lancets (ONETOUCH DELICA PLUS LANCET30G) MISC USE TO TEST BLOOD SUGAR 3 TIMES DAILY AS INSTRUCTED. 100 each 4   losartan (COZAAR) 50 MG tablet TAKE 1 TABLET BY MOUTH EVERY DAY 90 tablet 3   metFORMIN (GLUCOPHAGE) 1000 MG tablet TAKE 1 TABLET BY MOUTH TWICE A DAY WITH MEALS 180 tablet 3   metoprolol succinate (TOPROL-XL) 25 MG 24 hr tablet TAKE 1 TABLET (25 MG TOTAL) BY MOUTH DAILY. 90 tablet 1   ONETOUCH VERIO test strip USE AS INSTRUCTED TO CHECK BLOOD SUGAR 2 TIMES A DAY E11.42 100 strip 1   pantoprazole (PROTONIX) 40 MG tablet Take 1 tablet (40 mg total) by mouth daily. 45 tablet 0   No current facility-administered medications for this encounter.    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  Tobacco Use   Smoking status: Never   Smokeless tobacco: Never   Tobacco comments:    Never smoke 07/22/21  Vaping Use   Vaping Use: Never used  Substance and Sexual Activity   Alcohol use: No   Drug use: No   Sexual activity: Not on file  Other Topics Concern   Not on file  Social History Narrative   Married 38 years   Wife - Eunice Blase   Son 20   Daughter 52   Social Determinants of Health   Financial Resource  Strain: Not on file  Food Insecurity: Not on file  Transportation Needs: Not on file  Physical Activity: Not on file  Stress: Not on file  Social Connections: Not on file  Intimate Partner Violence: Not on file      Family History  Problem Relation Age of Onset   Diabetes Mother    Hypertension Mother    Diabetes Father    Heart disease Father    Diabetes Brother    Diabetes Sister     Vitals:   10/06/21 0944 10/06/21 1025  BP: (!) 98/50 104/72  Pulse: 61   SpO2: 98%   Weight: 92 kg (202 lb 12.8 oz)      PHYSICAL EXAM: General:  Well appearing. No resp difficulty HEENT: normal Neck: supple. no JVD. Carotids 2+ bilat; no bruits. No lymphadenopathy or thryomegaly appreciated. Cor: PMI nondisplaced. Regular rate & rhythm. No rubs, gallops or murmurs. Lungs: clear Abdomen: soft, nontender, nondistended. No hepatosplenomegaly. No bruits or masses. Good bowel sounds. Extremities: no cyanosis, clubbing, rash, edema Neuro: alert & orientedx3, cranial nerves grossly intact. moves all 4 extremities w/o difficulty. Affect pleasant  ASSESSMENT & PLAN:  1. Paroxysmal AFL/AF - asymptomatic. unknown duration.  - currently well rate-controlled but rate was 110-120 on echo yesterday - Echo 03/30/21 read as EF 35-40% but I think more like 45-50% with EF hard to assess due to rapid AFL - CHADVASc  =  Continue Eliquis & Toprol - Zio 4/23 100% AF mean VR = 73 - Underwent TEE/ DC-CV on 04/08/21. EF 45-50% Has seen Dr. Lalla Brothers regarding AF ablation which is scheduled for 07/01/21  - Sleep study 5/23 with severe complex SDB OSA  44 CSA 24 - Underwent AF ablation with Dr. Lalla Brothers 07/01/21   2. Bicuspid aortic valve with dilated aortic root.  - s/p Bentall aortic root replacement with bioprosthetic AVR on 01/10/19 at the [facility] - Echo 03/04/20 EF 60-65. Normal diastolic function. RV ok. Bioprosthetic AVR ok mean gradient .  - Echo 03/30/21 AoV ok  - Aware of need for SBE  prophylaxis. Using regularly - Off ASA 81 with Eliquis  3. HTN - Blood pressure on low end. Cut losartan to 25  4. Type 2 DM - controlled HgBA1c 5.8 followed by Dr. Marylouise Stacks - On Farxiga and atorva  5. Severe sleep apnea - we discussed mechanisms of SDB discussed at length. Suspect this is strongly related to his AF and will need to be addressed to reduce risk of recurrence after ablation -Sleep study 5/23 with severe complex SDB OSA  44 CSA 24 -Now on CPAP   Reuel Boom  Sorah Falkenstein, MD 10/06/21

## 2021-10-07 ENCOUNTER — Ambulatory Visit: Payer: Medicare Other | Attending: Cardiology | Admitting: Cardiology

## 2021-10-07 ENCOUNTER — Encounter: Payer: Self-pay | Admitting: Cardiology

## 2021-10-07 VITALS — BP 100/62 | HR 60 | Ht 77.0 in | Wt 203.2 lb

## 2021-10-07 DIAGNOSIS — I10 Essential (primary) hypertension: Secondary | ICD-10-CM | POA: Insufficient documentation

## 2021-10-07 DIAGNOSIS — I4819 Other persistent atrial fibrillation: Secondary | ICD-10-CM | POA: Diagnosis present

## 2021-10-07 DIAGNOSIS — I483 Typical atrial flutter: Secondary | ICD-10-CM | POA: Insufficient documentation

## 2021-10-07 DIAGNOSIS — G4733 Obstructive sleep apnea (adult) (pediatric): Secondary | ICD-10-CM | POA: Insufficient documentation

## 2021-10-07 NOTE — Progress Notes (Signed)
Electrophysiology Office Follow up Visit Note:    Date:  10/07/2021   ID:  Clifford Alvarez, DOB 11-22-1955, MRN [id number]  PCP:  Cassandria Anger, MD  [facility] Cardiologist:  Fransico Him, MD  [facility] Electrophysiologist:  Vickie Epley, MD    Interval History:    Clifford Alvarez is a 66 y.o. male who presents for a follow up visit after his A-fib ablation July 01, 2021.  During his ablation the veins, posterior wall and CTI were ablated.  He still Adline Peals in the A-fib clinic on July 22, 2021.  At that appointment he was maintaining sinus rhythm. He saw Dr. Haroldine Laws on October 06, 2021.  At his appointment, he reported no recurrence of atrial fibrillation or flutter.   He denies ever being symptomatic with his A-fib. He has been compliant with his Eliquis and Metoprolol. He does have questions about coming off of the Eliquis since he has not had any known recurrence of A-fib after ablation. He denies any bleeding problems.  He reports that his blood pressures have been around 696 systolic. His Losartan was reduced to 25mg  daily by Dr. Haroldine Laws yesterday. Patient has been using a wrist BP cuff.  He is receiving therapy for his sleep apnea.  He has been staying active without difficulty.      Past Medical History:  Diagnosis Date   Ascending aortic aneurysm (HCC)    5.4cm ascending aorta by Chest CT 12/2018, s/p thoracic aortic aneurysm repair with Biobentall 46mm Perimount Valve, 50mm graft   Bicuspid aortic valve    no AS or AI on echo 12/2018   CAD (coronary artery disease), native coronary artery    minimal CAD of the pLAD and diagonal with 0-24% stenosis   CHF (congestive heart failure) (HCC)    Coronary artery calcification seen on CAT scan    calcium score 11 12/2018   Diabetes mellitus without complication (HCC)    Dysrhythmia    Glaucoma    Heart murmur    History of childhood heart murmur (Aortic)    Past Surgical History:   Procedure Laterality Date   ANTERIOR CERVICAL DECOMP/DISCECTOMY FUSION N/A 05/13/2021   Procedure: Anterior Cervical Discetomy Fusion - Cervical five-Cervical six - Cervical six-Cervical seven;  Surgeon: Kary Kos, MD;  Location: [place];  Service: Neurosurgery;  Laterality: N/A;   ATRIAL FIBRILLATION ABLATION N/A 07/01/2021   Procedure: ATRIAL FIBRILLATION ABLATION;  Surgeon: Vickie Epley, MD;  Location: [place] [facility];  Service: Cardiovascular;  Laterality: N/A;   BENTALL PROCEDURE  01/10/2019   at [place] N/A 04/08/2021   Procedure: CARDIOVERSION;  Surgeon: Jolaine Artist, MD;  Location: [place];  Service: Cardiovascular;  Laterality: N/A;   COLONOSCOPY  2006   Dr. Cristina Gong   EYE SURGERY     TEE [place] 04/08/2021   Procedure: TRANSESOPHAGEAL ECHOCARDIOGRAM (TEE);  Surgeon: Jolaine Artist, MD;  Location: [facility] ENDOSCOPY;  Service: Cardiovascular;  Laterality: N/A;   TONSILLECTOMY      Current Medications: Current Meds  Medication Sig   Alpha-Lipoic Acid 300 MG TABS Take 600 mg by mouth in the morning and at bedtime.   apixaban (ELIQUIS) 5 MG TABS tablet Take 1 tablet (5 mg total) by mouth 2 (two) times daily.   atorvastatin (LIPITOR) 40 MG tablet TAKE 1 TABLET BY MOUTH DAILY AT 6 PM.   B Complex-C (SUPER B COMPLEX/VITAMIN C) TABS Take 1 tablet by mouth daily.  Cholecalciferol (VITAMIN D3) 50 MCG (2000 UT) capsule TAKE 1 CAPSULE BY MOUTH EVERY DAY   dorzolamide-timolol (COSOPT) 22.3-6.8 MG/ML ophthalmic solution Place 1 drop into both eyes 2 (two) times daily.   Lancets (ONETOUCH DELICA PLUS LANCET30G) MISC USE TO TEST BLOOD SUGAR 3 TIMES DAILY AS INSTRUCTED.   losartan (COZAAR) 25 MG tablet Take 1 tablet (25 mg total) by mouth daily.   metFORMIN (GLUCOPHAGE) 1000 MG tablet TAKE 1 TABLET BY MOUTH TWICE A DAY WITH MEALS   metoprolol succinate (TOPROL-XL) 25 MG 24 hr tablet TAKE 1 TABLET (25 MG TOTAL) BY MOUTH DAILY.    ONETOUCH VERIO test strip USE AS INSTRUCTED TO CHECK BLOOD SUGAR 2 TIMES A DAY E11.42   pantoprazole (PROTONIX) 40 MG tablet Take 1 tablet (40 mg total) by mouth daily.     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  Tobacco Use   Smoking status: Never   Smokeless tobacco: Never   Tobacco comments:    Never smoke 07/22/21  Vaping Use   Vaping Use: Never used  Substance and Sexual Activity   Alcohol use: No   Drug use: No   Sexual activity: Not on file  Other Topics Concern   Not on file  Social History Narrative   Married 38 years   Wife - Clifford Alvarez   Son 10   Daughter 8   Social Determinants of Corporate investment banker Strain: Not on Ship broker Insecurity: Not on file  Transportation Needs: Not on file  Physical Activity: Not on file  Stress: Not on file  Social Connections: Not on file     Family History: The patient's family history includes Diabetes in his brother, father, mother, and sister; Heart disease in his father; Hypertension in his mother.  ROS:   Please see the history of present illness.    All other systems reviewed and are negative.  EKGs/Labs/Other Studies Reviewed:    The following studies were reviewed today:   EKG:  The ekg ordered today demonstrates sinus, RBBB  Recent Labs: 03/31/2021: ALT 18; TSH 2.385 06/09/2021: BUN 22; Creatinine, Ser 1.10; Hemoglobin 13.6; Platelets 171; Potassium 5.2; Sodium 141  Recent Lipid Panel    Component Value Date/Time   CHOL 118 06/06/2021 0823   TRIG 145.0 06/06/2021 0823   HDL 34.70 (L) 06/06/2021 0823   CHOLHDL 3 06/06/2021 0823   VLDL 29.0 06/06/2021 0823   LDLCALC 54 06/06/2021 0823   LDLDIRECT 84.0 11/04/2018 0827    Physical Exam:     VS:  BP 100/62   Pulse 60   Ht 6\' 5"  (1.956 m)   Wt 203 lb 3.2 oz (92.2 kg)   SpO2 97%   BMI 24.10 kg/m     Wt Readings from Last 3 Encounters:  10/07/21 203 lb 3.2 oz (92.2 kg)  10/06/21 202 lb 12.8 oz (92 kg)  08/03/21 195 lb (88.5 kg)     GEN:  Well nourished, well developed in no acute distress HEENT: Normal NECK: No JVD; No carotid bruits LYMPHATICS: No lymphadenopathy CARDIAC: RRR, no murmurs, rubs, gallops RESPIRATORY:  Clear to auscultation without rales, wheezing or rhonchi  ABDOMEN: Soft, non-tender, non-distended MUSCULOSKELETAL:  No edema; No deformity  SKIN: Warm and dry NEUROLOGIC:  Alert and oriented x 3 PSYCHIATRIC:  Normal affect        ASSESSMENT:    1. Persistent atrial fibrillation (HCC)   2. Typical atrial flutter (HCC)   3. OSA (obstructive sleep apnea)   4. Primary hypertension    PLAN:    In order of problems listed above:  #Persistent atrial fibrillation #Atrial flutter Asymptomatic, rates controlled. Rhythm control indicated given history of reduced EF. Thoroughly discussed risks vs benefits of remaining on Eliquis given the possibility of recurrence of his A-fib. Continue Eliquis and Metoprolol. Encouraged continued surveillance with EKGs using his Kardia monitor 2-3 times per week. We also discussed alternative monitoring devices including a loop recorder which he may consider in the future particularly if we discontinue he Eliquis.  #Obstructive sleep apnea CPAP use encouraged.  #Hypertension At goal today.  Recommend checking blood pressures 1-2 times per week at home and recording the values.  Recommend bringing these recordings to the primary care physician.   Follow-up 1 year with APP.   Medication Adjustments/Labs and Tests Ordered: Current medicines are reviewed at length with the patient today.  Concerns regarding medicines are outlined above.  No orders of the defined types were placed in this encounter.  No orders  of the defined types were placed in this encounter.  This document serves as a record of services personally performed by Steffanie Dunn, MD. It was created on her behalf by Pura Spice, a trained medical scribe. The creation of this record is based on the scribe's personal observations and the provider's statements to them. This document has been checked and approved by the attending provider.  I, Lanier Prude, MD, have reviewed all documentation for this visit. The documentation on 10/07/21 for the exam, diagnosis, procedures, and orders are all accurate and complete.     Signed, Steffanie Dunn, MD, [facility], [facility] 10/07/2021 9:05 AM    Electrophysiology  [facility]

## 2021-10-07 NOTE — Patient Instructions (Signed)
Medication Instructions:  No changes *If you need a refill on your cardiac medications before your next appointment, please call your pharmacy*   Lab Work: None  If you have labs (blood work) drawn today and your tests are completely normal, you will receive your results only by: [place] (if you have MyChart) OR A paper copy in the mail If you have any lab test that is abnormal or we need to change your treatment, we will call you to review the results.   Testing/Procedures: none   Follow-Up: At [facility], you and your health needs are our priority.  As part of our continuing mission to provide you with exceptional heart care, we have created designated Provider Care Teams.  These Care Teams include your primary Cardiologist (physician) and Advanced Practice Providers (APPs -  Physician Assistants and Nurse Practitioners) who all work together to provide you with the care you need, when you need it.  We recommend signing up for the patient portal called "MyChart".  Sign up information is provided on this After Visit Summary.  MyChart is used to connect with patients for Virtual Visits (Telemedicine).  Patients are able to view lab/test results, encounter notes, upcoming appointments, etc.  Non-urgent messages can be sent to your provider as well.   To learn more about what you can do with MyChart, go to [email or web address].    Your next appointment:   1 year(s)  The format for your next appointment:   In Person  Provider:   You will see one of the following Advanced Practice Providers on your designated Care Team:   Tommye Standard, [place] Legrand Como "Jonni Sanger" Chalmers Cater, [place]      Other Instructions none  Important Information About Sugar

## 2021-11-11 ENCOUNTER — Encounter: Payer: Self-pay | Admitting: Cardiology

## 2021-11-11 ENCOUNTER — Ambulatory Visit: Payer: Medicare Other | Attending: Cardiology | Admitting: Cardiology

## 2021-11-11 VITALS — BP 110/72 | HR 53 | Ht 77.0 in | Wt 205.8 lb

## 2021-11-11 DIAGNOSIS — G4733 Obstructive sleep apnea (adult) (pediatric): Secondary | ICD-10-CM | POA: Diagnosis present

## 2021-11-11 DIAGNOSIS — I2510 Atherosclerotic heart disease of native coronary artery without angina pectoris: Secondary | ICD-10-CM | POA: Diagnosis not present

## 2021-11-11 NOTE — Patient Instructions (Signed)
Medication Instructions:  Your physician recommends that you continue on your current medications as directed. Please refer to the Current Medication list given to you today.  *If you need a refill on your cardiac medications before your next appointment, please call your pharmacy*  Follow-Up: At [facility], you and your health needs are our priority.  As part of our continuing mission to provide you with exceptional heart care, we have created designated Provider Care Teams.  These Care Teams include your primary Cardiologist (physician) and Advanced Practice Providers (APPs -  Physician Assistants and Nurse Practitioners) who all work together to provide you with the care you need, when you need it.  Your next appointment:   1 year(s)  The format for your next appointment:   In Person  Provider:   Traci Turner, MD     Important Information About Sugar       

## 2021-11-11 NOTE — Progress Notes (Addendum)
Sleep Medicine CONSULT Note    Date:  11/11/2021   ID:  Ree Edman, DOB 1955-08-16, MRN [id number]  PCP:  Tresa Garter, MD  Cardiologist: Steffanie Dunn, MD and Arvilla Meres, MD  Chief Complaint  Patient presents with   New Patient (Initial Visit)    Obstructive sleep apnea    History of Present Illness:  Clifford Alvarez is a 66 y.o. male who is being seen today for the evaluation of obstructive sleep apnea at the request of Arvilla Meres, MD.  This is a 66 year old male with a history of a sending aortic aneurysm, diabetes mellitus, coronary artery calcifications, CHF and bicuspid aortic valve and also with atrial fibrillation followed by EP.  Due to new onset of atrial flutter in the past year Dr. Gala Romney ordered a home sleep study.  Sleep study was done 05/10/2021 showing severe obstructive sleep apnea with an AHI of 44.3/h and moderate central sleep apnea with a central AHI of 24/hour and nocturnal hypoxemia with O2 saturations as low as 77%.  He underwent CPAP titration to 7 cm H2O and is now been referred to sleep medicine for evaluation.  He is doing well with his PAP device and thinks that he has gotten used to it.  He tolerates the Nasal pillow mask and feels the pressure is adequate.  Since going on PAP he feels rested in the am and has no significant daytime sleepiness.  He denies any significant mouth or nasal dryness or nasal congestion.  He does not think that he snores.    Past Medical History:  Diagnosis Date   Ascending aortic aneurysm (HCC)    5.4cm ascending aorta by Chest CT 12/2018, s/p thoracic aortic aneurysm repair with Biobentall 82mm Perimount Valve, 51mm graft   Bicuspid aortic valve    no AS or AI on echo 12/2018   CAD (coronary artery disease), native coronary artery    minimal CAD of the pLAD and diagonal with 0-24% stenosis   CHF (congestive heart failure) (HCC)    Coronary artery calcification seen on CAT scan     calcium score 11 12/2018   Diabetes mellitus without complication (HCC)    Dysrhythmia    Glaucoma    Heart murmur    History of childhood heart murmur (Aortic)    Past Surgical History:  Procedure Laterality Date   ANTERIOR CERVICAL DECOMP/DISCECTOMY FUSION N/A 05/13/2021   Procedure: Anterior Cervical Discetomy Fusion - Cervical five-Cervical six - Cervical six-Cervical seven;  Surgeon: Donalee Citrin, MD;  Location: [facility] OR;  Service: Neurosurgery;  Laterality: N/A;   ATRIAL FIBRILLATION ABLATION N/A 07/01/2021   Procedure: ATRIAL FIBRILLATION ABLATION;  Surgeon: Lanier Prude, MD;  Location: [facility] INVASIVE CV LAB;  Service: Cardiovascular;  Laterality: N/A;   BENTALL PROCEDURE  01/10/2019   at [facility]   CARDIOVERSION N/A 04/08/2021   Procedure: CARDIOVERSION;  Surgeon: Dolores Patty, MD;  Location: [facility] ENDOSCOPY;  Service: Cardiovascular;  Laterality: N/A;   COLONOSCOPY  2006   Dr. Matthias Hughs   EYE SURGERY     TEE WITHOUT CARDIOVERSION N/A 04/08/2021   Procedure: TRANSESOPHAGEAL ECHOCARDIOGRAM (TEE);  Surgeon: Dolores Patty, MD;  Location: [facility] ENDOSCOPY;  Service: Cardiovascular;  Laterality: N/A;   TONSILLECTOMY      Current Medications: Current Meds  Medication Sig   Alpha-Lipoic Acid 300 MG TABS Take 600 mg by mouth in the morning and at bedtime.   apixaban (ELIQUIS) 5 MG TABS tablet Take 1 tablet (  5 mg total) by mouth 2 (two) times daily.   atorvastatin (LIPITOR) 40 MG tablet TAKE 1 TABLET BY MOUTH DAILY AT 6 PM.   B Complex-C (SUPER B COMPLEX/VITAMIN C) TABS Take 1 tablet by mouth daily.   Cholecalciferol (VITAMIN D3) 50 MCG (2000 UT) capsule TAKE 1 CAPSULE BY MOUTH EVERY DAY   dorzolamide-timolol (COSOPT) 22.3-6.8 MG/ML ophthalmic solution Place 1 drop into both eyes 2 (two) times daily.   Lancets (ONETOUCH DELICA PLUS LANCET30G) MISC USE TO TEST BLOOD SUGAR 3 TIMES DAILY AS INSTRUCTED.   losartan (COZAAR) 25 MG tablet Take 1 tablet (25 mg total) by mouth  daily.   metFORMIN (GLUCOPHAGE) 1000 MG tablet TAKE 1 TABLET BY MOUTH TWICE A DAY WITH MEALS   metoprolol succinate (TOPROL-XL) 25 MG 24 hr tablet TAKE 1 TABLET (25 MG TOTAL) BY MOUTH DAILY.   ONETOUCH VERIO test strip USE AS INSTRUCTED TO CHECK BLOOD SUGAR 2 TIMES A DAY E11.42   pantoprazole (PROTONIX) 40 MG tablet Take 1 tablet (40 mg total) by mouth daily.    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  Tobacco Use   Smoking status: Never   Smokeless tobacco: Never   Tobacco comments:    Never smoke 07/22/21  Vaping Use   Vaping Use: Never used  Substance and Sexual Activity   Alcohol use: No   Drug use: No   Sexual activity: Not on file  Other Topics Concern   Not on file  Social History Narrative   Married 38 years   Wife - Eunice Blase   Son 1   Daughter 23   Social Determinants of Corporate investment banker Strain: Not on Ship broker Insecurity: Not on file  Transportation Needs: Not on file  Physical Activity: Not on file  Stress: Not on file  Social Connections: Not on file     Family History:  The patient's family history includes Diabetes in his brother, father, mother, and sister; Heart disease in his father; Hypertension in his mother.   ROS:   Please see the history of present illness.    ROS All other systems reviewed and are negative.      No data to display             PHYSICAL EXAM:   VS:  There were no vitals taken for this visit.   GEN: Well nourished, well developed, in no acute distress  HEENT: normal  Neck: no JVD, carotid bruits, or masses Cardiac: RRR; no rubs, or gallops,no edema.  Intact distal pulses bilaterally. 2/6 SM ar RUSB Respiratory:  clear to auscultation  bilaterally, normal work of breathing GI: soft, nontender, nondistended, + BS MS: no deformity or atrophy  Skin: warm and dry, no rash Neuro:  Alert and Oriented x 3, Strength and sensation are intact Psych: euthymic mood, full affect  Wt Readings from Last 3 Encounters:  10/07/21 203 lb 3.2 oz (92.2 kg)  10/06/21 202 lb 12.8 oz (92 kg)  08/03/21 195 lb (88.5 kg)      Studies/Labs Reviewed:   Home sleep study, CPAP titration and Pap compliance download  Recent Labs: 03/31/2021: ALT 18; TSH 2.385 06/09/2021: BUN  22; Creatinine, Ser 1.10; Hemoglobin 13.6; Platelets 171; Potassium 5.2; Sodium 141    CHA2DS2-VASc Score = 4   This indicates a 4.8% annual risk of stroke. The patient's score is based upon: CHF History: 1 HTN History: 1 Diabetes History: 1 Stroke History: 0 Vascular Disease History: 0 Age Score: 1 Gender Score: 0          Additional studies/ records that were reviewed today include:  None    ASSESSMENT:    1. OSA (obstructive sleep apnea)      PLAN:  In order of problems listed above:  OSA - The patient is tolerating PAP therapy well without any problems. The PAP download performed by his DME was personally reviewed and interpreted by me today and showed an AHI of 2.4 /hr on 7 cm H2O with 93% compliance in using more than 4 hours nightly.  The patient has been using and benefiting from PAP use and will continue to benefit from therapy.    Time Spent: 20 minutes total time of encounter, including 15 minutes spent in face-to-face patient care on the date of this encounter. This time includes coordination of care and counseling regarding above mentioned problem list. Remainder of non-face-to-face time involved reviewing chart documents/testing relevant to the patient encounter and documentation in the medical record. I have independently reviewed documentation from referring provider  Medication Adjustments/Labs and Tests Ordered: Current medicines are  reviewed at length with the patient today.  Concerns regarding medicines are outlined above.  Medication changes, Labs and Tests ordered today are listed in the Patient Instructions below.  There are no Patient Instructions on file for this visit.   Signed, Armanda Magic, MD  11/11/2021 1:39 PM    [facility] [address] [place], [place], [place]  [address] Phone: [phone]; Fax: [phone]

## 2021-11-28 ENCOUNTER — Other Ambulatory Visit (HOSPITAL_COMMUNITY): Payer: Self-pay | Admitting: Internal Medicine

## 2021-12-08 ENCOUNTER — Telehealth: Payer: Self-pay | Admitting: Internal Medicine

## 2021-12-08 NOTE — Telephone Encounter (Signed)
Left message for patient to call back to schedule Medicare Annual Wellness Visit   No hx of AWV eligible as of 04/02/20  Please schedule at anytime with [facility] Advisor if patient calls the office back.     Any questions, please call me at [phone]

## 2021-12-11 ENCOUNTER — Other Ambulatory Visit (HOSPITAL_COMMUNITY): Payer: Self-pay | Admitting: Internal Medicine

## 2021-12-12 ENCOUNTER — Ambulatory Visit (INDEPENDENT_AMBULATORY_CARE_PROVIDER_SITE_OTHER): Payer: Medicare Other | Admitting: Internal Medicine

## 2021-12-12 ENCOUNTER — Encounter: Payer: Self-pay | Admitting: Internal Medicine

## 2021-12-12 VITALS — BP 110/62 | HR 50 | Ht 77.0 in | Wt 206.8 lb

## 2021-12-12 DIAGNOSIS — E1159 Type 2 diabetes mellitus with other circulatory complications: Secondary | ICD-10-CM | POA: Diagnosis not present

## 2021-12-12 DIAGNOSIS — E7800 Pure hypercholesterolemia, unspecified: Secondary | ICD-10-CM

## 2021-12-12 DIAGNOSIS — G63 Polyneuropathy in diseases classified elsewhere: Secondary | ICD-10-CM | POA: Diagnosis not present

## 2021-12-12 DIAGNOSIS — I2510 Atherosclerotic heart disease of native coronary artery without angina pectoris: Secondary | ICD-10-CM | POA: Diagnosis not present

## 2021-12-12 LAB — POCT GLYCOSYLATED HEMOGLOBIN (HGB A1C): Hemoglobin A1C: 5.7 % — AB (ref 4.0–5.6)

## 2021-12-12 MED ORDER — DAPAGLIFLOZIN PROPANEDIOL 10 MG PO TABS: 10.0000 mg | ORAL_TABLET | Freq: Every day | ORAL | 3 refills | Status: DC

## 2021-12-12 NOTE — Progress Notes (Signed)
Patient ID: Clifford Alvarez, male   DOB: 1955/06/26, 66 y.o.   MRN: [id number]  HPI: Clifford Alvarez is a 66 y.o.-year-old male, returning for f/u for DM2 dx 01/28/2013, previously insulin-dependent, now off insulin, controlled, with complications (CAD, Peripheral neuropathy). Last visit was 6 months ago.  Interim history: No increased urination, blurry vision, nausea, chest pain. He had cardioversion and ablation earlier this year.  Reviewed HbA1c levels: Lab Results  Component Value Date   HGBA1C 6.1 (H) 05/12/2021   HGBA1C 6.0 (A) 12/13/2020   HGBA1C 5.9 (A) 06/01/2020   HGBA1C 5.9 (A) 12/08/2019   HGBA1C 6.4 07/09/2019   HGBA1C 6.4 (A) 06/30/2019   HGBA1C 6.0 (A) 02/12/2019   HGBA1C 6.0 (A) 11/11/2018   HGBA1C 6.1 (A) 05/20/2018   HGBA1C 5.8 (A) 11/19/2017   HGBA1C 6.0 05/14/2017   HGBA1C 6 01/08/2017   HGBA1C 6.3 10/26/2016   HGBA1C 5.7 08/07/2016   HGBA1C 6.0 04/17/2016   HGBA1C 6.0 12/13/2015   HGBA1C 5.9 08/12/2015   HGBA1C 5.9 04/13/2015   HGBA1C 5.8 12/15/2014   HGBA1C 5.9 08/17/2014   He is on: - Metformin 1000 mg 2x a day with meals - Jardiance 25 mg in am >> Farxiga 10 mg in a.m. >> Invokana 100,  then 300 mg daily  >> Farxiga 5 mg daily - 02/2019. He was previously on Basaglar 12 units at bedtime >> stopped 08/2016 >> restarted: 6-8 units 3x a day >> now off completely.  Pt checks his sugars once a day: - am: 110-140s >> 110-140s >> 97-146 >> 90, 115-135 >> 100-140, usually 120s - 2h after b'fast: 146 >> 197 >> n/c  - before lunch:  102, 128 >> n/c - 2h after lunch: 1n/c >> 126 >> n/c - before dinner: n/c >> n/c >> 124-142 >> n/c - 2h after dinner:  110-140s >> 130-140 >> n/c - bedtime:  120-174 >> n/c - nighttime:  123-145, 170 >> n/c Lowest sugar was 97 >> 90 >> .  He  has hypoglycemia awareness in the 70s. Highest sugar was 146 >> 135 >> 140.  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  Pt's meals are: - Breakfast: bagel, oatmeal, granola,  eggs - Lunch: meat + 2-3 vegetables; soup; salad; sandwich - Dinner: meat + 2-3 vegetables; pasta; [place]; seafood - Snacks: 1 a day >> nuts, popcorn, dark chocolate  -No CKD, last BUN/creatinine:  Lab Results  Component Value Date   BUN 22 06/09/2021   CREATININE 1.10 06/09/2021   Lab Results  Component Value Date   GFRNONAA >60 05/12/2021   GFRNONAA >60 03/31/2021   GFRNONAA >60 11/05/2019   GFRNONAA >60 01/24/2019   GFRNONAA >89 04/13/2015   No available: Lab Results  Component Value Date   MICRALBCREAT 0.5 07/09/2019   MICRALBCREAT 0.6 11/04/2018   MICRALBCREAT 1.6 10/29/2017   MICRALBCREAT 0.8 10/26/2016   MICRALBCREAT 0.6 04/13/2015   MICRALBCREAT 0.7 05/11/2014   MICRALBCREAT 0.3 03/12/2013  On losartan.  -+ HL; reviewed latest lipid panel: Lab Results  Component Value Date   CHOL 118 06/06/2021   HDL 34.70 (L) 06/06/2021   LDLCALC 54 06/06/2021   LDLDIRECT 84.0 11/04/2018   TRIG 145.0 06/06/2021   CHOLHDL 3 06/06/2021  On Lipitor 40.  - last eye exam was on 01/31/2021: No DR, + primary open-angle glaucoma OU at [facility]. Dr. Lottie Dawson.  He is seen every 4 mo - has frequent VF tests.  He had cataract surgeries.  - He has numbness  but no tingling in his feet.  He is status post ascending aortic aneurysm repair at [facility] clinic in 01/2018. He has persistent A-fib, CAD, severe OSA. He had discectomy 05/13/2021. Pain and numbness in L arm resolved.  ROS: + see HPI  I reviewed pt's medications, allergies, PMH, social hx, family hx, and changes were documented in the history of present illness. Otherwise, unchanged from my initial visit note.  Past Medical History:  Diagnosis Date   Ascending aortic aneurysm (HCC)    5.4cm ascending aorta by Chest CT 12/2018, s/p thoracic aortic aneurysm repair with Biobentall 23mm Perimount Valve, 55mm graft   Bicuspid aortic valve    no AS or AI on echo 12/2018   CAD (coronary artery disease), native coronary artery     minimal CAD of the pLAD and diagonal with 0-24% stenosis   CHF (congestive heart failure) (HCC)    Coronary artery calcification seen on CAT scan    calcium score 11 12/2018   Diabetes mellitus without complication (HCC)    Dysrhythmia    Glaucoma    Heart murmur    History of childhood heart murmur (Aortic)   Past Surgical History:  Procedure Laterality Date   ANTERIOR CERVICAL DECOMP/DISCECTOMY FUSION N/A 05/13/2021   Procedure: Anterior Cervical Discetomy Fusion - Cervical five-Cervical six - Cervical six-Cervical seven;  Surgeon: Donalee Citrin, MD;  Location: [facility] OR;  Service: Neurosurgery;  Laterality: N/A;   ATRIAL FIBRILLATION ABLATION N/A 07/01/2021   Procedure: ATRIAL FIBRILLATION ABLATION;  Surgeon: Lanier Prude, MD;  Location: [facility] INVASIVE CV LAB;  Service: Cardiovascular;  Laterality: N/A;   BENTALL PROCEDURE  01/10/2019   at [facility]   CARDIOVERSION N/A 04/08/2021   Procedure: CARDIOVERSION;  Surgeon: Dolores Patty, MD;  Location: [facility] ENDOSCOPY;  Service: Cardiovascular;  Laterality: N/A;   COLONOSCOPY  2006   Dr. 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  Tobacco Use   Smoking status: Never   Smokeless tobacco: Never   Tobacco comments:    Never smoke 07/22/21  Vaping Use   Vaping Use: Never used  Substance and Sexual Activity   Alcohol use: No   Drug use: No   Sexual activity: Not on file  Other Topics Concern   Not on file  Social History Narrative   Married 38 years    Wife - Barbarann Ehlers 28   Daughter 44   Social Determinants of Corporate investment banker Strain: Not on Ship broker Insecurity: Not on file  Transportation Needs: Not on file  Physical Activity: Not on file  Stress: Not on file  Social Connections: Not on file  Intimate Partner Violence: Not on file   Current Outpatient Medications on File Prior to Visit  Medication Sig Dispense Refill   Alpha-Lipoic Acid 300 MG TABS Take 600 mg by mouth in the morning and at bedtime.     atorvastatin (LIPITOR) 40 MG tablet TAKE 1 TABLET BY MOUTH  DAILY AT 6 PM. 90 tablet 3   B Complex-C (SUPER B COMPLEX/VITAMIN C) TABS Take 1 tablet by mouth daily.     Cholecalciferol (VITAMIN D3) 50 MCG (2000 UT) capsule TAKE 1 CAPSULE BY MOUTH EVERY DAY 90 capsule 3   dorzolamide-timolol (COSOPT) 22.3-6.8 MG/ML ophthalmic solution Place 1 drop into both eyes 2 (two) times daily.     ELIQUIS 5 MG TABS tablet TAKE 1 TABLET BY MOUTH TWICE A DAY 60 tablet 6   Lancets (ONETOUCH DELICA PLUS LANCET30G) MISC USE TO TEST BLOOD SUGAR 3 TIMES DAILY AS INSTRUCTED. 100 each 4   losartan (COZAAR) 25 MG tablet Take 1 tablet (25 mg total) by mouth daily. 90 tablet 3   metFORMIN (GLUCOPHAGE) 1000 MG tablet TAKE 1 TABLET BY MOUTH TWICE A DAY WITH MEALS 180 tablet 3   metoprolol succinate (TOPROL-XL) 25 MG 24 hr tablet TAKE 1 TABLET (25 MG TOTAL) BY MOUTH DAILY. 90 tablet 1   ONETOUCH VERIO test strip USE AS INSTRUCTED TO CHECK BLOOD SUGAR 2 TIMES A DAY E11.42 100 strip 1   pantoprazole (PROTONIX) 40 MG tablet Take 1 tablet (40 mg total) by mouth daily. 45 tablet 0   No current facility-administered medications on file prior to visit.   Allergies  Allergen Reactions   Penicillins     Childhood reaction    Family History  Problem Relation Age of Onset   Diabetes Mother    Hypertension Mother    Diabetes Father    Heart disease Father    Diabetes Brother    Diabetes Sister    PE: BP 110/62 (BP Location: Left Arm, Patient  Position: Sitting, Cuff Size: Normal)   Pulse (!) 50   Ht 6\' 5"  (1.956 m)   Wt 206 lb 12.8 oz (93.8 kg)   SpO2 96%   BMI 24.52 kg/m    Wt Readings from Last 3 Encounters:  12/12/21 206 lb 12.8 oz (93.8 kg)  11/11/21 205 lb 12.8 oz (93.4 kg)  10/07/21 203 lb 3.2 oz (92.2 kg)   Constitutional: Slightly overweight, in NAD Eyes: EOMI, no exophthalmos ENT: no thyromegaly, no cervical lymphadenopathy Cardiovascular: RRR, No MRG Respiratory: CTA B Musculoskeletal: no deformities Skin:+ Rash on feet Neurological: no tremor with outstretched hands Diabetic Foot Exam - Simple   Simple Foot Form Diabetic Foot exam was performed with the following findings: Yes 12/12/2021  8:23 AM  Visual Inspection See comments: Yes Sensation Testing Intact to touch and monofilament testing bilaterally: Yes Pulse Check Posterior Tibialis and Dorsalis pulse intact bilaterally: Yes Comments Hammer toes Dry eczematous skin on soles and between toes Onychodystrophy    ASSESSMENT: 1. DM2, insulin-independent, controlled, with complications  - CAD - PN -stable  2. PN 2/2 diabetes  3. HL  PLAN:  1. Patient with well-controlled type 2 diabetes, on oral antidiabetic regimen with metformin and low-dose SGLT2 inhibitor.  We are using about 10 mg Farxiga tablet since this is more cost-advantageous for him.  At last visit, HbA1c was 6.1%.  He was still only checking sugars in the morning and the majority of them were at goal.  I advised him to continue the same regimen but rotate the sugar checks throughout the day. -At today's visit, he checks his blood sugars and they are usually at goal in the morning, with only an occasional mild hyperglycemic value after 140s.  He is still not checking sugars later in the day.  For now, in the light of the excellent HbA1c, we  can continue the current regimen.  I did encourage him to occasionally check later in the day, just to make sure that his sugars remain controlled,  also. - I suggested to: Patient Instructions  Please  continue: - Metformin 1000 mg 2x a day with meals - Farxiga 5-10 mg daily before breakfast    Please return in 6 months with your sugar log.   - we checked his HbA1c: 5.7% (lower) - advised to check sugars at different times of the day - 1x a day, rotating check times - advised for yearly eye exams >> he is UTD - he has a desquamative rash on bilateral soles and between toes.  He also has onychodystrophy.  We discussed about possible options for treatment.  He tried Vicks vapor rub in the past, but he did not feel that this worked well for him.  I did suggest he see podiatry. - return to clinic in 6 months  2. 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 HL -Reviewed latest lipid panel from 06/2021: LDL slightly low, otherwise fractions at goal: Lab Results  Component Value Date   CHOL 118 06/06/2021   HDL 34.70 (L) 06/06/2021   LDLCALC 54 06/06/2021   LDLDIRECT 84.0 11/04/2018   TRIG 145.0 06/06/2021   CHOLHDL 3 06/06/2021  -He continues on Lipitor 40 mg daily without side effects  [name], MD PhD [facility] Endocrinology

## 2021-12-12 NOTE — Patient Instructions (Addendum)
Please  continue: - Metformin 1000 mg 2x a day with meals - Farxiga 5-10 mg daily before breakfast    Please return in 6 months with your sugar log.

## 2021-12-22 ENCOUNTER — Ambulatory Visit (INDEPENDENT_AMBULATORY_CARE_PROVIDER_SITE_OTHER): Payer: Medicare Other

## 2021-12-22 ENCOUNTER — Telehealth: Payer: Self-pay

## 2021-12-22 VITALS — Ht 77.0 in | Wt 207.0 lb

## 2021-12-22 DIAGNOSIS — Z0000 Encounter for general adult medical examination without abnormal findings: Secondary | ICD-10-CM | POA: Diagnosis not present

## 2021-12-22 NOTE — Telephone Encounter (Signed)
Called patient lvm to return call, to complete AWV at [phone].  If no return call within 15 minutes, patient may reschedule for the next available appointment with [facility] or CMA. -S. Meshia Rau,LPN 

## 2021-12-22 NOTE — Progress Notes (Addendum)
Virtual Visit via Telephone Note  I connected with  Clifford Alvarez on 12/22/21 at 10:30 AM EST by telephone and verified that I am speaking with the correct person using two identifiers.  Location: Patient: Home Provider: [place] Persons participating in the virtual visit: [place]   I discussed the limitations, risks, security and privacy concerns of performing an evaluation and management service by telephone and the availability of in person appointments. The patient expressed understanding and agreed to proceed.  Interactive audio and video telecommunications were attempted between this nurse and patient, however failed, due to patient having technical difficulties OR patient did not have access to video capability.  We continued and completed visit with audio only.  Some vital signs may be absent or patient reported.   Sheral Flow, LPN  Subjective:   Clifford Alvarez is a 66 y.o. male who presents for an Initial Medicare Annual Wellness Visit.  Review of Systems     Cardiac Risk Factors include: advanced age (>30mn, >>23women)     Objective:    Today's Vitals   12/22/21 1044  Weight: 207 lb (93.9 kg)  Height: _0  (1.956 m)  PainSc: 0-No pain   Body mass index is 24.55 kg/m.     12/22/2021   10:46 AM 08/03/2021    8:22 PM 07/01/2021    6:12 AM 05/12/2021    3:19 PM 04/08/2021    6:59 AM 03/25/2013    8:16 AM  Advanced Directives  Does Patient Have a Medical Advance Directive? _1  Patient would not like information  Type of Advance Directive [place]Living will [place]Living will [place]valeLiving will H[place]trimLiving will HOxbow EstatesLiving will   Does patient want to make changes to medical advance directive? No - Patient declined No - Patient declined No - Patient declined No - Patient declined    Copy of  [place]in Chart? Yes - validated most recent copy scanned in chart (See row information) No - copy requested No - copy requested  No - copy requested     Current Medications (verified) Outpatient Encounter Medications as of 12/22/2021  Medication Sig   Alpha-Lipoic Acid 300 MG TABS Take 600 mg by mouth in the morning and at bedtime.   atorvastatin (LIPITOR) 40 MG tablet TAKE 1 TABLET BY MOUTH DAILY AT 6 PM.   B Complex-C (SUPER B COMPLEX/VITAMIN C) TABS Take 1 tablet by mouth daily.   Cholecalciferol (VITAMIN D3) 50 MCG (2000 UT) capsule TAKE 1 CAPSULE BY MOUTH EVERY DAY   dapagliflozin propanediol (FARXIGA) 10 MG TABS tablet Take 1 tablet (10 mg total) by mouth daily before breakfast.   dorzolamide-timolol (COSOPT) 22.3-6.8 MG/ML ophthalmic solution Place 1 drop into both eyes 2 (two) times daily.   ELIQUIS 5 MG TABS tablet TAKE 1 TABLET BY MOUTH TWICE A DAY   Lancets (ONETOUCH DELICA PLUS [id number] MISC USE TO TEST BLOOD SUGAR 3 TIMES DAILY AS INSTRUCTED.   losartan (COZAAR) 25 MG tablet Take 1 tablet (25 mg total) by mouth daily.   metFORMIN (GLUCOPHAGE) 1000 MG tablet TAKE 1 TABLET BY MOUTH TWICE A DAY WITH MEALS   metoprolol succinate (TOPROL-XL) 25 MG 24 hr tablet TAKE 1 TABLET (25 MG TOTAL) BY MOUTH DAILY.   ONETOUCH VERIO test strip USE AS INSTRUCTED TO CHECK BLOOD SUGAR 2 TIMES A DAY E11.42   No facility-administered encounter medications  on file as of 12/22/2021.    Allergies (verified) Penicillins   History: Past Medical History:  Diagnosis Date   Ascending aortic aneurysm (HCC)    5.4cm ascending aorta by Chest CT 12/2018, s/p thoracic aortic aneurysm repair with Biobentall 3m Perimount Valve, 340mgraft   Bicuspid aortic valve    no AS or AI on echo 12/2018   CAD (coronary artery disease), native coronary artery    minimal CAD of the pLAD and diagonal with 0-24% stenosis   CHF (congestive heart failure) (HCC)    Coronary artery calcification  seen on CAT scan    calcium score 11 12/2018   Diabetes mellitus without complication (HCC)    Dysrhythmia    Glaucoma    Heart murmur    History of childhood heart murmur (Aortic)   Past Surgical History:  Procedure Laterality Date   ANTERIOR CERVICAL DECOMP/DISCECTOMY FUSION N/A 05/13/2021   Procedure: Anterior Cervical Discetomy Fusion - Cervical five-Cervical six - Cervical six-Cervical seven;  Surgeon: CrKary KosMD;  [place] [facility] Service: Neurosurgery;  Laterality: N/A;   ATRIAL FIBRILLATION ABLATION N/A 07/01/2021   Procedure: ATRIAL FIBRILLATION ABLATION;  Surgeon: LaVickie EpleyMD;  [facility];  Service: Cardiovascular;  Laterality: N/A;   BENTALL PROCEDURE  01/10/2019   at [facility] 04/08/2021   Procedure: CARDIOVERSION;  Surgeon: BeJolaine ArtistMD;  [facility] Service: Cardiovascular;  Laterality: N/A;   COLONOSCOPY  2006   Dr. BuCristina Gong EYE SURGERY     TEE [place]/07/2021   Procedure: TRANSESOPHAGEAL ECHOCARDIOGRAM (TEE);  Surgeon: BeJolaine ArtistMD;  [facility];  Service: Cardiovascular;  Laterality: N/A;   TONSILLECTOMY     Family History  Problem Relation Age of Onset   Diabetes Mother    Hypertension Mother    Diabetes Father    Heart disease Father    Diabetes Brother    Diabetes Sister    Social History   Socioeconomic History   Marital status: Married    Spouse name: Debbie   Number of children: 2   Years of education: Not on file   Highest education level: Not on file  Occupational History   Occupation: Self employed - CETeacher, 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  Tobacco Use   Smoking status: Never   Smokeless tobacco: Never   Tobacco comments:    Never smoke 07/22/21  Vaping Use   Vaping Use: Never used  Substance and Sexual Activity   Alcohol use: No   Drug use: No   Sexual activity: Not on file  Other Topics Concern   Not on file   Social History Narrative   Married [id number]   Wife - DeEverett Graff8   Daughter [id number] Social Determinants of Health   Financial Resource Strain: Low Risk  (12/22/2021)   Overall Financial Resource Strain (CARDIA)    Difficulty of Paying Living Expenses: Not hard at all  Food Insecurity: No Food Insecurity (12/22/2021)   Hunger Vital Sign    Worried About Running Out of Food in the Last Year: Never true    [place] the Last Year: Never true  Transportation Needs: No Transportation Needs (12/22/2021)   PRAPARE - TrHydrologistMedical): No    Lack of Transportation (Non-Medical): No  Physical Activity: Sufficiently Active (12/22/2021)   Exercise Vital Sign  Days of Exercise per Week: 5 days    Minutes of Exercise per Session: 30 min  Stress: No Stress Concern Present (12/22/2021)   [place]    Feeling of Stress : Not at all  Social Connections: Socially Integrated (12/22/2021)   Social Connection and Isolation Panel [NHANES]    Frequency of Communication with Friends and Family: More than three times a week    Frequency of Social Gatherings with Friends and Family: More than three times a week    Attends Religious Services: More than 4 times per year    Active Member of [employer] or Organizations: Yes    Attends Music therapist: More than 4 times per year    Marital Status: Married    Tobacco Counseling Counseling given: Not Answered Tobacco comments: Never smoke 07/22/21   Clinical Intake:  Pre-visit preparation completed: Yes  Pain : No/denies pain Pain Score: 0-No pain     BMI - recorded: 24.55 Nutritional Risks: None Diabetes: No  How often do you need to have someone help you when you read instructions, pamphlets, or other written materials from your doctor or pharmacy?: 1 - Never What is the last grade level you completed in school?:  Bachelor's degree; CEO of company  Nutrition Risk Assessment:  Has the patient had any N/V/D within the last 2 months?  No  Does the patient have any non-healing wounds?  No  Has the patient had any unintentional weight loss or weight gain?  No   Diabetes:  Is the patient diabetic?  Yes  If diabetic, was a CBG obtained today?  No  Did the patient bring in their glucometer from home?  No  How often do you monitor your CBG's? Once daily.   Financial Strains and Diabetes Management:  Are you having any financial strains with the device, your supplies or your medication? No .  Does the patient want to be seen by Chronic Care Management for management of their diabetes?  No  Would the patient like to be referred to a Nutritionist or for Diabetic Management?  No   Diabetic Exams:  Diabetic Eye Exam: Completed 07/11/2021 Diabetic Foot Exam: Completed 12/12/2021   Interpreter Needed?: No  Information entered by :: Lisette Abu, LPN.   Activities of Daily Living    12/22/2021   10:48 AM 05/13/2021    7:45 PM  In your present state of health, do you have any difficulty performing the following activities:  Hearing? 0   Vision? 0   Difficulty concentrating or making decisions? 0   Walking or climbing stairs? 0   Dressing or bathing? 0   Doing errands, shopping? 0 0  Preparing Food and eating ? N   Using the Toilet? N   In the past six months, have you accidently leaked urine? N   Do you have problems with loss of bowel control? N   Managing your Medications? N   Managing your Finances? N   Housekeeping or managing your Housekeeping? N     Patient Care Team: Plotnikov, Evie Lacks, MD as PCP - General (Internal Medicine) Sueanne Margarita, MD as PCP - Cardiology (Cardiology) Bensimhon, Shaune Pascal, MD as PCP - Advanced Heart Failure (Cardiology) Vickie Epley, MD as PCP - Electrophysiology (Cardiology) Philemon Kingdom, MD as Consulting Physician (Internal  Medicine)  Indicate any recent Medical Services you may have received from other than [facility] providers in the past year (date may  be approximate).     Assessment:   This is a routine wellness examination for [place].  Hearing/Vision screen Hearing Screening - Comments:: Denies hearing difficulties   Vision Screening - Comments:: Lens implant; uses readers - up to date with routine eye exams with Raynelle Fanning, MD.   Dietary issues and exercise activities discussed: Current Exercise Habits: Home exercise routine, Type of exercise: walking;treadmill, Time (Minutes): 35, Frequency (Times/Week): 5, Weekly Exercise (Minutes/Week): 175, Intensity: Moderate, Exercise limited by: None identified   Goals Addressed             This Visit's Progress    To maintain my current health status by continuing to eat healthy, stay physically active and socially active.        Depression Screen    12/22/2021   10:52 AM 07/20/2021    8:42 AM 03/22/2021    8:57 AM 02/23/2021    9:53 AM 02/09/2021    8:40 AM 11/04/2018    8:43 AM 10/29/2017    8:08 AM  PHQ 2/9 Scores  PHQ - 2 Score 0 0 0 0 0 0 0    Fall Risk    12/22/2021   10:47 AM 07/20/2021    8:42 AM 03/22/2021    8:57 AM 02/23/2021    9:53 AM 02/09/2021    8:40 AM  Fall Risk   Falls in the past year? 0 0 0 0 0  Number falls in past yr: 0 0 0  0  Injury with Fall? 0 0 0  0  Risk for fall due to : No Fall Risks No Fall Risks     Follow up Falls prevention discussed Falls evaluation completed       FALL RISK PREVENTION PERTAINING TO THE HOME:  Any stairs in or around the home? Yes  If so, are there any without handrails? No  Home free of loose throw rugs in walkways, pet beds, electrical cords, etc? Yes  Adequate lighting in your home to reduce risk of falls? Yes   ASSISTIVE DEVICES UTILIZED TO PREVENT FALLS:  Life alert? No  Use of a cane, walker or w/c? No  Grab bars in the bathroom? No  Shower chair or bench in shower? Yes   Elevated toilet seat or a handicapped toilet? No   TIMED UP AND JO:ACZYS Visit  Was the test performed? No .   Cognitive Function:        12/22/2021   10:52 AM  6CIT Screen  What Year? 0 points  What month? 0 points  What time? 0 points  Count back from 20 0 points  Months in reverse 0 points  Repeat phrase 0 points  Total Score 0 points    Immunizations Immunization History  Administered Date(s) Administered   Fluad Quad(high Dose 65+) 11/04/2018, 10/30/2019   Hepatitis A, Adult 06/24/2014   Influenza, High Dose Seasonal PF 09/28/2014, 09/30/2021   Influenza,inj,Quad PF,6+ Mos 11/03/2013, 09/09/2015, 10/23/2016, 10/29/2017   Influenza-Unspecified 10/19/2020   Moderna Sars-Covid-2 Vaccination 03/13/2019, 04/10/2019, 10/30/2019   PFIZER Comirnaty(Gray Top)Covid-19 Tri-Sucrose Vaccine 07/22/2020, 10/19/2020   Pneumococcal Conjugate-13 10/06/2015   Pneumococcal Polysaccharide-23 10/23/2016   Td 01/03/2012   Zoster Recombinat (Shingrix) 10/29/2017, 01/07/2018    TDAP status: Up to date  Flu Vaccine status: Up to date  Pneumococcal vaccine status: Due, Education has been provided regarding the importance of this vaccine. Advised may receive this vaccine at local pharmacy or Health Dept. Aware to provide a copy of the vaccination record if obtained  from local pharmacy or Health Dept. Verbalized acceptance and understanding.  Covid-19 vaccine status: Completed vaccines  Qualifies for Shingles Vaccine? Yes   Zostavax completed No   Shingrix Completed?: Yes  Screening Tests Health Maintenance  Topic Date Due   OPHTHALMOLOGY EXAM  08/12/2015   Diabetic kidney evaluation - Urine ACR  07/08/2020   COVID-19 Vaccine (6 - 2023-24 season) 09/02/2021   Pneumonia Vaccine 75+ Years old (3 - PPSV23 or PCV20) 10/23/2021   DTaP/Tdap/Td (2 - Tdap) 01/02/2022   Diabetic kidney evaluation - eGFR measurement  06/10/2022   HEMOGLOBIN A1C  06/13/2022   FOOT EXAM  12/13/2022    Medicare Annual Wellness (AWV)  12/23/2022   Fecal DNA (Cologuard)  03/16/2024   INFLUENZA VACCINE  Completed   Hepatitis C Screening  Completed   Zoster Vaccines- Shingrix  Completed   HPV VACCINES  Aged Out    Health Maintenance  Health Maintenance Due  Topic Date Due   OPHTHALMOLOGY EXAM  08/12/2015   Diabetic kidney evaluation - Urine ACR  07/08/2020   COVID-19 Vaccine (6 - 2023-24 season) 09/02/2021   Pneumonia Vaccine 80+ Years old (3 - PPSV23 or PCV20) 10/23/2021    Colorectal cancer screening: Type of screening: Cologuard. Completed 03/23/2021. Repeat every 3 years  Lung Cancer Screening: (Low Dose CT Chest recommended if Age 92-80 years, 30 pack-year currently smoking OR have quit w/in 15years.) does not qualify.   Lung Cancer Screening Referral: no  Additional Screening:  Hepatitis C Screening: does qualify; Completed 10/07/2015  Vision Screening: Recommended annual ophthalmology exams for early detection of glaucoma and other disorders of the eye. Is the patient up to date with their annual eye exam?  Yes  Who is the provider or what is the name of the office in which the patient attends annual eye exams? Raynelle Fanning, MD. If pt is not established with a provider, would they like to be referred to a provider to establish care? No .   Dental Screening: Recommended annual dental exams for proper oral hygiene  Community Resource Referral / Chronic Care Management: CRR required this visit?  No   CCM required this visit?  No      Plan:     I have personally reviewed and noted the following in the patient's chart:   Medical and social history Use of alcohol, tobacco or illicit drugs  Current medications and supplements including opioid prescriptions. Patient is not currently taking opioid prescriptions.. Functional ability and status Nutritional status Physical activity Advanced directives List of other physicians Hospitalizations, surgeries, and ER visits in  previous 12 months Vitals Screenings to include cognitive, depression, and falls Referrals and appointments  In addition, I have reviewed and discussed with patient certain preventive protocols, quality metrics, and best practice recommendations. A written personalized care plan for preventive services as well as general preventive health recommendations were provided to patient.     Sheral Flow, LPN   10/40/4591   Nurse Notes: N/A   Medical screening examination/treatment/procedure(s) were performed by non-physician practitioner and as supervising physician I was immediately available for consultation/collaboration.  I agree with above. Lew Dawes, MD

## 2021-12-22 NOTE — Telephone Encounter (Signed)
Patient returned call and AWV was completed by phone today.

## 2021-12-22 NOTE — Patient Instructions (Signed)
Clifford Alvarez , Thank you for taking time to come for your Medicare Wellness Visit. I appreciate your ongoing commitment to your health goals. Please review the following plan we discussed and let me know if I can assist you in the future.   These are the goals we discussed:  Goals      To maintain my current health status by continuing to eat healthy, stay physically active and socially active.        This is a list of the screening recommended for you and due dates:  Health Maintenance  Topic Date Due   Eye exam for diabetics  08/12/2015   Yearly kidney health urinalysis for diabetes  07/08/2020   COVID-19 Vaccine (6 - 2023-24 season) 09/02/2021   Pneumonia Vaccine (3 - PPSV23 or PCV20) 10/23/2021   DTaP/Tdap/Td vaccine (2 - Tdap) 01/02/2022   Yearly kidney function blood test for diabetes  06/10/2022   Hemoglobin A1C  06/13/2022   Complete foot exam   12/13/2022   Medicare Annual Wellness Visit  12/23/2022   Cologuard (Stool DNA test)  03/16/2024   Flu Shot  Completed   Hepatitis C Screening: USPSTF Recommendation to screen - Ages 18-79 yo.  Completed   Zoster (Shingles) Vaccine  Completed   HPV Vaccine  Aged Out    Advanced directives: Yes; documents on file.  Conditions/risks identified: Yes; Type II Diabetes  Next appointment: Follow up in one year for your annual wellness visit.   Preventive Care 67 Years and Older, Male  Preventive care refers to lifestyle choices and visits with your health care provider that can promote health and wellness. What does preventive care include? A yearly physical exam. This is also called an annual well check. Dental exams once or twice a year. Routine eye exams. Ask your health care provider how often you should have your eyes checked. Personal lifestyle choices, including: Daily care of your teeth and gums. Regular physical activity. Eating a healthy diet. Avoiding tobacco and drug use. Limiting alcohol use. Practicing safe  sex. Taking low doses of aspirin every day. Taking vitamin and mineral supplements as recommended by your health care provider. What happens during an annual well check? The services and screenings done by your health care provider during your annual well check will depend on your age, overall health, lifestyle risk factors, and family history of disease. Counseling  Your health care provider may ask you questions about your: Alcohol use. Tobacco use. Drug use. Emotional well-being. Home and relationship well-being. Sexual activity. Eating habits. History of falls. Memory and ability to understand (cognition). Work and work Astronomer. Screening  You may have the following tests or measurements: Height, weight, and BMI. Blood pressure. Lipid and cholesterol levels. These may be checked every 5 years, or more frequently if you are over 42 years old. Skin check. Lung cancer screening. You may have this screening every year starting at age 23 if you have a 30-pack-year history of smoking and currently smoke or have quit within the past 15 years. Fecal occult blood test (FOBT) of the stool. You may have this test every year starting at age 87. Flexible sigmoidoscopy or colonoscopy. You may have a sigmoidoscopy every 5 years or a colonoscopy every 10 years starting at age 47. Prostate cancer screening. Recommendations will vary depending on your family history and other risks. Hepatitis C blood test. Hepatitis B blood test. Sexually transmitted disease (STD) testing. Diabetes screening. This is done by checking your blood sugar (glucose) after  you have not eaten for a while (fasting). You may have this done every 1-3 years. Abdominal aortic aneurysm (AAA) screening. You may need this if you are a current or former smoker. Osteoporosis. You may be screened starting at age 66 if you are at high risk. Talk with your health care provider about your test results, treatment options, and if  necessary, the need for more tests. Vaccines  Your health care provider may recommend certain vaccines, such as: Influenza vaccine. This is recommended every year. Tetanus, diphtheria, and acellular pertussis (Tdap, Td) vaccine. You may need a Td booster every 10 years. Zoster vaccine. You may need this after age 27. Pneumococcal 13-valent conjugate (PCV13) vaccine. One dose is recommended after age 63. Pneumococcal polysaccharide (PPSV23) vaccine. One dose is recommended after age 78. Talk to your health care provider about which screenings and vaccines you need and how often you need them. This information is not intended to replace advice given to you by your health care provider. Make sure you discuss any questions you have with your health care provider. Document Released: 01/15/2015 Document Revised: 09/08/2015 Document Reviewed: 10/20/2014 Elsevier Interactive Patient Education  2017 [place] Prevention in the Home Falls can cause injuries. They can happen to people of all ages. There are many things you can do to make your home safe and to help prevent falls. What can I do on the outside of my home? Regularly fix the edges of walkways and driveways and fix any cracks. Remove anything that might make you trip as you walk through a door, such as a raised step or threshold. Trim any bushes or trees on the path to your home. Use bright outdoor lighting. Clear any walking paths of anything that might make someone trip, such as rocks or tools. Regularly check to see if handrails are loose or broken. Make sure that both sides of any steps have handrails. Any raised decks and porches should have guardrails on the edges. Have any leaves, snow, or ice cleared regularly. Use sand or salt on walking paths during winter. Clean up any spills in your garage right away. This includes oil or grease spills. What can I do in the bathroom? Use night lights. Install grab bars by the toilet  and in the tub and shower. Do not use towel bars as grab bars. Use non-skid mats or decals in the tub or shower. If you need to sit down in the shower, use a plastic, non-slip stool. Keep the floor dry. Clean up any water that spills on the floor as soon as it happens. Remove soap buildup in the tub or shower regularly. Attach bath mats securely with double-sided non-slip rug tape. Do not have throw rugs and other things on the floor that can make you trip. What can I do in the bedroom? Use night lights. Make sure that you have a light by your bed that is easy to reach. Do not use any sheets or blankets that are too big for your bed. They should not hang down onto the floor. Have a firm chair that has side arms. You can use this for support while you get dressed. Do not have throw rugs and other things on the floor that can make you trip. What can I do in the kitchen? Clean up any spills right away. Avoid walking on wet floors. Keep items that you use a lot in easy-to-reach places. If you need to reach something above you, use a strong  step stool that has a grab bar. Keep electrical cords out of the way. Do not use floor polish or wax that makes floors slippery. If you must use wax, use non-skid floor wax. Do not have throw rugs and other things on the floor that can make you trip. What can I do with my stairs? Do not leave any items on the stairs. Make sure that there are handrails on both sides of the stairs and use them. Fix handrails that are broken or loose. Make sure that handrails are as long as the stairways. Check any carpeting to make sure that it is firmly attached to the stairs. Fix any carpet that is loose or worn. Avoid having throw rugs at the top or bottom of the stairs. If you do have throw rugs, attach them to the floor with carpet tape. Make sure that you have a light switch at the top of the stairs and the bottom of the stairs. If you do not have them, ask someone to add  them for you. What else can I do to help prevent falls? Wear shoes that: Do not have high heels. Have rubber bottoms. Are comfortable and fit you well. Are closed at the toe. Do not wear sandals. If you use a stepladder: Make sure that it is fully opened. Do not climb a closed stepladder. Make sure that both sides of the stepladder are locked into place. Ask someone to hold it for you, if possible. Clearly mark and make sure that you can see: Any grab bars or handrails. First and last steps. Where the edge of each step is. Use tools that help you move around (mobility aids) if they are needed. These include: Canes. Walkers. Scooters. Crutches. Turn on the lights when you go into a dark area. Replace any light bulbs as soon as they burn out. Set up your furniture so you have a clear path. Avoid moving your furniture around. If any of your floors are uneven, fix them. If there are any pets around you, be aware of where they are. Review your medicines with your doctor. Some medicines can make you feel dizzy. This can increase your chance of falling. Ask your doctor what other things that you can do to help prevent falls. This information is not intended to replace advice given to you by your health care provider. Make sure you discuss any questions you have with your health care provider. Document Released: 10/15/2008 Document Revised: 05/27/2015 Document Reviewed: 01/23/2014 Elsevier Interactive Patient Education  2017 [employer].

## 2021-12-25 ENCOUNTER — Telehealth: Payer: Medicare Other | Admitting: Nurse Practitioner

## 2021-12-25 DIAGNOSIS — U071 COVID-19: Secondary | ICD-10-CM | POA: Diagnosis not present

## 2021-12-25 MED ORDER — PROMETHAZINE-DM 6.25-15 MG/5ML PO SYRP: 5.0000 mL | ORAL_SOLUTION | Freq: Four times a day (QID) | ORAL | 0 refills | Status: DC | PRN

## 2021-12-25 MED ORDER — MOLNUPIRAVIR EUA 200MG CAPSULE: 4.0000 | ORAL_CAPSULE | Freq: Two times a day (BID) | ORAL | 0 refills | Status: AC

## 2021-12-25 NOTE — Patient Instructions (Signed)
Clifford Alvarez, thank you for joining Claiborne Rigg, NP for today's virtual visit.  While this provider is not your primary care provider (PCP), if your PCP is located in our provider database this encounter information will be shared with them immediately following your visit.   A [facility] MyChart account gives you access to today's visit and all your visits, tests, and labs performed at [facility] " click here if you don't have a  MyChart account or go to [email or web address]  Consent: (Patient) Clifford Alvarez provided verbal consent for this virtual visit at the beginning of the encounter.  Current Medications:  Current Outpatient Medications:    molnupiravir EUA (LAGEVRIO) 200 mg CAPS capsule, Take 4 capsules (800 mg total) by mouth 2 (two) times daily for 5 days., Disp: 40 capsule, Rfl: 0   promethazine-dextromethorphan (PROMETHAZINE-DM) 6.25-15 MG/5ML syrup, Take 5 mLs by mouth 4 (four) times daily as needed for cough., Disp: 240 mL, Rfl: 0   Alpha-Lipoic Acid 300 MG TABS, Take 600 mg by mouth in the morning and at bedtime., Disp: , Rfl:    atorvastatin (LIPITOR) 40 MG tablet, TAKE 1 TABLET BY MOUTH DAILY AT 6 PM., Disp: 90 tablet, Rfl: 3   B Complex-C (SUPER B COMPLEX/VITAMIN C) TABS, Take 1 tablet by mouth daily., Disp: , Rfl:    Cholecalciferol (VITAMIN D3) 50 MCG (2000 UT) capsule, TAKE 1 CAPSULE BY MOUTH EVERY DAY, Disp: 90 capsule, Rfl: 3   dapagliflozin propanediol (FARXIGA) 10 MG TABS tablet, Take 1 tablet (10 mg total) by mouth daily before breakfast., Disp: 90 tablet, Rfl: 3   dorzolamide-timolol (COSOPT) 22.3-6.8 MG/ML ophthalmic solution, Place 1 drop into both eyes 2 (two) times daily., Disp: , Rfl:    ELIQUIS 5 MG TABS tablet, TAKE 1 TABLET BY MOUTH TWICE A DAY, Disp: 60 tablet, Rfl: 6   Lancets (ONETOUCH DELICA PLUS LANCET30G) MISC, USE TO TEST BLOOD SUGAR 3 TIMES DAILY AS INSTRUCTED., Disp: 100 each, Rfl: 4   losartan (COZAAR)  25 MG tablet, Take 1 tablet (25 mg total) by mouth daily., Disp: 90 tablet, Rfl: 3   metFORMIN (GLUCOPHAGE) 1000 MG tablet, TAKE 1 TABLET BY MOUTH TWICE A DAY WITH MEALS, Disp: 180 tablet, Rfl: 3   metoprolol succinate (TOPROL-XL) 25 MG 24 hr tablet, TAKE 1 TABLET (25 MG TOTAL) BY MOUTH DAILY., Disp: 90 tablet, Rfl: 1   ONETOUCH VERIO test strip, USE AS INSTRUCTED TO CHECK BLOOD SUGAR 2 TIMES A DAY E11.42, Disp: 100 strip, Rfl: 1   Medications ordered in this encounter:  Meds ordered this encounter  Medications   molnupiravir EUA (LAGEVRIO) 200 mg CAPS capsule    Sig: Take 4 capsules (800 mg total) by mouth 2 (two) times daily for 5 days.    Dispense:  40 capsule    Refill:  0    Order Specific Question:   Supervising Provider    Answer:   Merrilee Jansky [id number]   promethazine-dextromethorphan (PROMETHAZINE-DM) 6.25-15 MG/5ML syrup    Sig: Take 5 mLs by mouth 4 (four) times daily as needed for cough.    Dispense:  240 mL    Refill:  0    Order Specific Question:   Supervising Provider    Answer:   Merrilee Jansky [[id number]]     *If you need refills on other medications prior to your next appointment, please contact your pharmacy*  Follow-Up: Call back or seek an in-person evaluation if the symptoms worsen or if  the condition fails to improve as anticipated.  [facility] [phone]  Other Instructions  Please keep well-hydrated and get plenty of rest. Start a saline nasal rinse to flush out your nasal passages. You can use plain Mucinex to help thin congestion. If you have a humidifier, you can use this daily as needed.    You are to wear a mask for 5 days from onset of your symptoms.  After day 5, if you have had no fever and you are feeling better with NO symptoms, you can end masking. Keep in mind you can be contagious 10 days from the onset of symptoms  After day 5 if you have a fever or are having significant symptoms, please wear your mask for full 10  days.   If you note any worsening of symptoms, any significant shortness of breath or any chest pain, please seek ER evaluation ASAP.  Please do not delay care!    If you note any worsening of symptoms, any significant shortness of breath or any chest pain, please seek ER evaluation ASAP.  Please do not delay care!    If you have been instructed to have an in-person evaluation today at a local Urgent Care facility, please use the link below. It will take you to a list of all of our available [facility], including address, phone number and hours of operation. Please do not delay care.  [facility]  If you or a family member do not have a primary care provider, use the link below to schedule a visit and establish care. When you choose a [facility] primary care physician or advanced practice provider, you gain a long-term partner in health. Find a Primary Care Provider  Learn more about [facility] in-office and virtual care options: [facility] - Get Care Now

## 2021-12-25 NOTE — Progress Notes (Signed)
Virtual Visit Consent   Clifford Alvarez, you are scheduled for a virtual visit with a [facility] provider today. Just as with appointments in the office, your consent must be obtained to participate. Your consent will be active for this visit and any virtual visit you may have with one of our providers in the next 365 days. If you have a MyChart account, a copy of this consent can be sent to you electronically.  As this is a virtual visit, video technology does not allow for your provider to perform a traditional examination. This may limit your provider's ability to fully assess your condition. If your provider identifies any concerns that need to be evaluated in person or the need to arrange testing (such as labs, EKG, etc.), we will make arrangements to do so. Although advances in technology are sophisticated, we cannot ensure that it will always work on either your end or our end. If the connection with a video visit is poor, the visit may have to be switched to a telephone visit. With either a video or telephone visit, we are not always able to ensure that we have a secure connection.  By engaging in this virtual visit, you consent to the provision of healthcare and authorize for your insurance to be billed (if applicable) for the services provided during this visit. Depending on your insurance coverage, you may receive a charge related to this service.  I need to obtain your verbal consent now. Are you willing to proceed with your visit today? Cashel Bellina has provided verbal consent on 12/25/2021 for a virtual visit (video or telephone). Claiborne Rigg, NP  Date: 12/25/2021 9:42 AM  Virtual Visit via Video Note   I, Claiborne Rigg, connected with  Clifford Alvarez  ([id number], [birth year]) on 12/25/21 at 10:30 AM EST by a video-enabled telemedicine application and verified that I am speaking with the correct person using two identifiers.  Location: Patient: Virtual Visit  Location Patient: Home Provider: Virtual Visit Location Provider: Home Office   I discussed the limitations of evaluation and management by telemedicine and the availability of in person appointments. The patient expressed understanding and agreed to proceed.    History of Present Illness: Clifford Alvarez is a 66 y.o. who identifies as a male who was assigned male at birth, and is being seen today for COVID POSITIVE.  Mr Niu tested positive for COVID today.  States his wife had COVID last week and was treated with molnupiravir.  He currently endorses symptoms of congestion, productive cough and mild headache.  Denies shortness of breath or chest pain.  He is agreeable to taking molnupiravir as Paxlovid is contraindicated with Eliquis  Problems:  Patient Active Problem List   Diagnosis Date Noted   OSA (obstructive sleep apnea) 08/03/2021   Persistent atrial fibrillation (HCC) 07/22/2021   Secondary hypercoagulable state (HCC) 07/22/2021   Spinal stenosis of cervical region 05/13/2021   Cervical radiculitis 02/18/2021   Pericardial effusion 01/14/2019   Atelectasis 01/12/2019   Hypervolemia 01/12/2019   Leukocytosis 01/12/2019   Postoperative pain 01/12/2019   CAD (coronary artery disease), native coronary artery    Coronary artery calcification seen on CAT scan    Coronary atherosclerosis 11/26/2018   Primary open angle glaucoma (POAG) of right eye, moderate stage 12/29/2016   Carotid bruit 10/07/2015   High arches 10/07/2015   Vitamin D deficiency 10/07/2015   Onychomycosis of toenail 10/07/2015   Tinea pedis 10/07/2015   Nuclear sclerosis  of both eyes 08/12/2014   Type 2 diabetes, controlled, with peripheral neuropathy (HCC) 01/29/2013   Hyperlipidemia 01/29/2013   Hypertension 01/29/2013   Family history of diabetes mellitus 01/22/2013   Well adult exam 01/22/2013    Allergies:  Allergies  Allergen Reactions   Penicillins     Childhood reaction    Medications:   Current Outpatient Medications:    molnupiravir EUA (LAGEVRIO) 200 mg CAPS capsule, Take 4 capsules (800 mg total) by mouth 2 (two) times daily for 5 days., Disp: 40 capsule, Rfl: 0   promethazine-dextromethorphan (PROMETHAZINE-DM) 6.25-15 MG/5ML syrup, Take 5 mLs by mouth 4 (four) times daily as needed for cough., Disp: 240 mL, Rfl: 0   Alpha-Lipoic Acid 300 MG TABS, Take 600 mg by mouth in the morning and at bedtime., Disp: , Rfl:    atorvastatin (LIPITOR) 40 MG tablet, TAKE 1 TABLET BY MOUTH DAILY AT 6 PM., Disp: 90 tablet, Rfl: 3   B Complex-C (SUPER B COMPLEX/VITAMIN C) TABS, Take 1 tablet by mouth daily., Disp: , Rfl:    Cholecalciferol (VITAMIN D3) 50 MCG (2000 UT) capsule, TAKE 1 CAPSULE BY MOUTH EVERY DAY, Disp: 90 capsule, Rfl: 3   dapagliflozin propanediol (FARXIGA) 10 MG TABS tablet, Take 1 tablet (10 mg total) by mouth daily before breakfast., Disp: 90 tablet, Rfl: 3   dorzolamide-timolol (COSOPT) 22.3-6.8 MG/ML ophthalmic solution, Place 1 drop into both eyes 2 (two) times daily., Disp: , Rfl:    ELIQUIS 5 MG TABS tablet, TAKE 1 TABLET BY MOUTH TWICE A DAY, Disp: 60 tablet, Rfl: 6   Lancets (ONETOUCH DELICA PLUS LANCET30G) MISC, USE TO TEST BLOOD SUGAR 3 TIMES DAILY AS INSTRUCTED., Disp: 100 each, Rfl: 4   losartan (COZAAR) 25 MG tablet, Take 1 tablet (25 mg total) by mouth daily., Disp: 90 tablet, Rfl: 3   metFORMIN (GLUCOPHAGE) 1000 MG tablet, TAKE 1 TABLET BY MOUTH TWICE A DAY WITH MEALS, Disp: 180 tablet, Rfl: 3   metoprolol succinate (TOPROL-XL) 25 MG 24 hr tablet, TAKE 1 TABLET (25 MG TOTAL) BY MOUTH DAILY., Disp: 90 tablet, Rfl: 1   ONETOUCH VERIO test strip, USE AS INSTRUCTED TO CHECK BLOOD SUGAR 2 TIMES A DAY E11.42, Disp: 100 strip, Rfl: 1  Observations/Objective: Patient is well-developed, well-nourished in no acute distress.  Resting comfortably  at home.  Head is normocephalic, atraumatic.  No labored breathing.  Speech is clear and coherent with logical content.   Patient is alert and oriented at baseline.    Assessment and Plan: 1. Positive self-administered antigen test for COVID-19 - molnupiravir EUA (LAGEVRIO) 200 mg CAPS capsule; Take 4 capsules (800 mg total) by mouth 2 (two) times daily for 5 days.  Dispense: 40 capsule; Refill: 0 - promethazine-dextromethorphan (PROMETHAZINE-DM) 6.25-15 MG/5ML syrup; Take 5 mLs by mouth 4 (four) times daily as needed for cough.  Dispense: 240 mL; Refill: 0   Please keep well-hydrated and get plenty of rest. Start a saline nasal rinse to flush out your nasal passages. You can use plain Mucinex to help thin congestion. If you have a humidifier, you can use this daily as needed.    You are to wear a mask for 5 days from onset of your symptoms.  After day 5, if you have had no fever and you are feeling better with NO symptoms, you can end masking. Keep in mind you can be contagious 10 days from the onset of symptoms  After day 5 if you have a fever or are having  significant symptoms, please wear your mask for full 10 days.   If you note any worsening of symptoms, any significant shortness of breath or any chest pain, please seek ER evaluation ASAP.  Please do not delay care!    If you note any worsening of symptoms, any significant shortness of breath or any chest pain, please seek ER evaluation ASAP.  Please do not delay care!   Follow Up Instructions: I discussed the assessment and treatment plan with the patient. The patient was provided an opportunity to ask questions and all were answered. The patient agreed with the plan and demonstrated an understanding of the instructions.  A copy of instructions were sent to the patient via MyChart unless otherwise noted below.   The patient was advised to call back or seek an in-person evaluation if the symptoms worsen or if the condition fails to improve as anticipated.  Time:  I spent 12 minutes with the patient via telehealth technology discussing the above  problems/concerns.    Claiborne Rigg, NP

## 2022-01-03 ENCOUNTER — Other Ambulatory Visit: Payer: Self-pay | Admitting: Internal Medicine

## 2022-01-24 ENCOUNTER — Ambulatory Visit (INDEPENDENT_AMBULATORY_CARE_PROVIDER_SITE_OTHER): Payer: Medicare Other | Admitting: Internal Medicine

## 2022-01-24 ENCOUNTER — Encounter: Payer: Self-pay | Admitting: Internal Medicine

## 2022-01-24 VITALS — BP 110/60 | HR 60 | Temp 98.1°F | Ht 77.0 in | Wt 206.0 lb

## 2022-01-24 DIAGNOSIS — E559 Vitamin D deficiency, unspecified: Secondary | ICD-10-CM | POA: Diagnosis not present

## 2022-01-24 DIAGNOSIS — Z23 Encounter for immunization: Secondary | ICD-10-CM | POA: Diagnosis not present

## 2022-01-24 DIAGNOSIS — E1142 Type 2 diabetes mellitus with diabetic polyneuropathy: Secondary | ICD-10-CM

## 2022-01-24 DIAGNOSIS — M4802 Spinal stenosis, cervical region: Secondary | ICD-10-CM

## 2022-01-24 DIAGNOSIS — I10 Essential (primary) hypertension: Secondary | ICD-10-CM

## 2022-01-24 NOTE — Assessment & Plan Note (Signed)
On Losartan 

## 2022-01-24 NOTE — Patient Instructions (Signed)
tDAP °

## 2022-01-24 NOTE — Assessment & Plan Note (Signed)
On Vit D 

## 2022-01-24 NOTE — Assessment & Plan Note (Signed)
On Farxiga, Metformin Losartan - not taking, Lipitor A1c was OK

## 2022-01-24 NOTE — Assessment & Plan Note (Signed)
Doing well post-op 

## 2022-01-24 NOTE — Progress Notes (Signed)
Subjective:  Patient ID: Clifford Alvarez, male    DOB: 1955-08-14  Age: 67 y.o. MRN: [id number]  CC: No chief complaint on file.   HPI Clifford Alvarez presents for dyslipidemia, DM, HTN  Outpatient Medications Prior to Visit  Medication Sig Dispense Refill   Alpha-Lipoic Acid 300 MG TABS Take 600 mg by mouth in the morning and at bedtime.     atorvastatin (LIPITOR) 40 MG tablet TAKE 1 TABLET BY MOUTH DAILY AT 6 PM. 90 tablet 3   B Complex-C (SUPER B COMPLEX/VITAMIN C) TABS Take 1 tablet by mouth daily.     Cholecalciferol (VITAMIN D3) 50 MCG (2000 UT) capsule TAKE 1 CAPSULE BY MOUTH EVERY DAY 90 capsule 3   dapagliflozin propanediol (FARXIGA) 10 MG TABS tablet Take 1 tablet (10 mg total) by mouth daily before breakfast. 90 tablet 3   dorzolamide-timolol (COSOPT) 22.3-6.8 MG/ML ophthalmic solution Place 1 drop into both eyes 2 (two) times daily.     ELIQUIS 5 MG TABS tablet TAKE 1 TABLET BY MOUTH TWICE A DAY 60 tablet 6   Lancets (ONETOUCH DELICA PLUS [id number]) MISC USE TO TEST BLOOD SUGAR 3 TIMES DAILY AS INSTRUCTED. 100 each 4   losartan (COZAAR) 25 MG tablet Take 1 tablet (25 mg total) by mouth daily. 90 tablet 3   metFORMIN (GLUCOPHAGE) 1000 MG tablet TAKE 1 TABLET BY MOUTH TWICE A DAY WITH MEALS 180 tablet 3   metoprolol succinate (TOPROL-XL) 25 MG 24 hr tablet TAKE 1 TABLET (25 MG TOTAL) BY MOUTH DAILY. 90 tablet 1   ONETOUCH VERIO test strip USE AS INSTRUCTED TO CHECK BLOOD SUGAR 2 TIMES A DAY E11.42 100 strip 1   promethazine-dextromethorphan (PROMETHAZINE-DM) 6.25-15 MG/5ML syrup Take 5 mLs by mouth 4 (four) times daily as needed for cough. 240 mL 0   No facility-administered medications prior to visit.    ROS: Review of Systems  Constitutional:  Negative for appetite change, fatigue and unexpected weight change.  HENT:  Negative for congestion, nosebleeds, sneezing, sore throat and trouble swallowing.   Eyes:  Negative for itching and visual disturbance.   Respiratory:  Negative for cough.   Cardiovascular:  Negative for chest pain, palpitations and leg swelling.  Gastrointestinal:  Negative for abdominal distention, blood in stool, diarrhea and nausea.  Genitourinary:  Negative for frequency and hematuria.  Musculoskeletal:  Negative for back pain, gait problem, joint swelling and neck pain.  Skin:  Negative for rash.  Neurological:  Negative for dizziness, tremors, speech difficulty and weakness.  Psychiatric/Behavioral:  Negative for agitation, dysphoric mood and sleep disturbance. The patient is not nervous/anxious.     Objective:  BP 110/60 (BP Location: Left Arm, Patient Position: Sitting, Cuff Size: Normal)   Pulse 60   Temp 98.1 F (36.7 C) (Oral)   Ht 6\' 5"  (1.956 m)   Wt 206 lb (93.4 kg)   SpO2 98%   BMI 24.43 kg/m   BP Readings from Last 3 Encounters:  01/24/22 110/60  12/12/21 110/62  11/11/21 110/72    Wt Readings from Last 3 Encounters:  01/24/22 206 lb (93.4 kg)  12/22/21 207 lb (93.9 kg)  12/12/21 206 lb 12.8 oz (93.8 kg)    Physical Exam Constitutional:      General: He is not in acute distress.    Appearance: Normal appearance. He is well-developed.     Comments: NAD  Eyes:     Conjunctiva/sclera: Conjunctivae normal.     Pupils: Pupils are equal, round, and  reactive to light.  Neck:     Thyroid: No thyromegaly.     Vascular: No JVD.  Cardiovascular:     Rate and Rhythm: Normal rate and regular rhythm.     Heart sounds: Normal heart sounds. No murmur heard.    No friction rub. No gallop.  Pulmonary:     Effort: Pulmonary effort is normal. No respiratory distress.     Breath sounds: Normal breath sounds. No wheezing or rales.  Chest:     Chest wall: No tenderness.  Abdominal:     General: Bowel sounds are normal. There is no distension.     Palpations: Abdomen is soft. There is no mass.     Tenderness: There is no abdominal tenderness. There is no guarding or rebound.  Musculoskeletal:         General: No tenderness. Normal range of motion.     Cervical back: Normal range of motion.  Lymphadenopathy:     Cervical: No cervical adenopathy.  Skin:    General: Skin is warm and dry.     Findings: No rash.  Neurological:     Mental Status: He is alert and oriented to person, place, and time.     Cranial Nerves: No cranial nerve deficit.     Motor: No abnormal muscle tone.     Coordination: Coordination normal.     Gait: Gait normal.     Deep Tendon Reflexes: Reflexes are normal and symmetric.  Psychiatric:        Behavior: Behavior normal.        Thought Content: Thought content normal.        Judgment: Judgment normal.     Lab Results  Component Value Date   WBC 7.5 06/09/2021   HGB 13.6 06/09/2021   HCT 40.8 06/09/2021   PLT 171 06/09/2021   GLUCOSE 89 06/09/2021   CHOL 118 06/06/2021   TRIG 145.0 06/06/2021   HDL 34.70 (L) 06/06/2021   LDLDIRECT 84.0 11/04/2018   LDLCALC 54 06/06/2021   ALT 18 03/31/2021   AST 20 03/31/2021   NA 141 06/09/2021   K 5.2 06/09/2021   CL 105 06/09/2021   CREATININE 1.10 06/09/2021   BUN 22 06/09/2021   CO2 28 06/09/2021   TSH 2.385 03/31/2021   PSA 0.72 07/09/2019   HGBA1C 5.7 (A) 12/12/2021   MICROALBUR <0.7 07/09/2019    No results found.  Assessment & Plan:   Problem List Items Addressed This Visit       Cardiovascular and Mediastinum   Hypertension - Primary    On Losartan        Endocrine   Type 2 diabetes, controlled, with peripheral neuropathy (HCC) (Chronic)    On Farxiga, Metformin Losartan - not taking, Lipitor A1c was OK        Other   Spinal stenosis of cervical region    Doing well post-op      Vitamin D deficiency    On Vit D         No orders of the defined types were placed in this encounter.     Follow-up: Return in about 5 months (around 06/25/2022) for Wellness Exam.  Walker Kehr, MD

## 2022-01-24 NOTE — Addendum Note (Signed)
Addended by: Marijean Heath R on: 01/24/2022 09:55 AM   Modules accepted: Orders

## 2022-02-09 ENCOUNTER — Ambulatory Visit: Payer: BLUE CROSS/BLUE SHIELD | Admitting: Internal Medicine

## 2022-02-10 ENCOUNTER — Ambulatory Visit (INDEPENDENT_AMBULATORY_CARE_PROVIDER_SITE_OTHER): Payer: Medicare Other | Admitting: Physician Assistant

## 2022-02-10 ENCOUNTER — Encounter: Payer: Self-pay | Admitting: Physician Assistant

## 2022-02-10 VITALS — BP 110/66 | HR 71 | Temp 97.3°F | Ht 77.0 in | Wt 206.5 lb

## 2022-02-10 DIAGNOSIS — R051 Acute cough: Secondary | ICD-10-CM

## 2022-02-10 MED ORDER — AZITHROMYCIN 250 MG PO TABS: ORAL_TABLET | ORAL | 0 refills | Status: AC

## 2022-02-10 MED ORDER — ALBUTEROL SULFATE HFA 108 (90 BASE) MCG/ACT IN AERS: 2.0000 | INHALATION_SPRAY | Freq: Four times a day (QID) | RESPIRATORY_TRACT | 0 refills | Status: DC | PRN

## 2022-02-10 MED ORDER — BENZONATATE 100 MG PO CAPS: 100.0000 mg | ORAL_CAPSULE | Freq: Two times a day (BID) | ORAL | 0 refills | Status: DC | PRN

## 2022-02-10 NOTE — Progress Notes (Signed)
Clifford Alvarez is a 67 y.o. male here for a new problem.  History of Present Illness:   Chief Complaint  Patient presents with   Cough    Pt c/o cough and chest congestion x  1 week, expectorating yellow/green sputum. Denies fever or chills. Has been using Benadryl and Mucinex.    HPI  Cough Patient is complaining of a cough with yellow/green sputum and chest congestion occurring for a week. He states that his symptoms have gotten progressively worse and has now lost his voice. Patient confirms going to [facility] 2 weeks ago. Patient has not taken a Covid test, but had Covid during Christmas time last year. Wife states that the patient usually gets symptoms like these every January and is usually treated with a zpak and an inhaler. He denies fever, SOB, sinus pressure, sore throat, and chills. He has managed his symptoms with benadryl at first and then mucinex with relief.    Past Medical History:  Diagnosis Date   Ascending aortic aneurysm (HCC)    5.4cm ascending aorta by Chest CT 12/2018, s/p thoracic aortic aneurysm repair with Biobentall 67m Perimount Valve, 326mgraft   Bicuspid aortic valve    no AS or AI on echo 12/2018   CAD (coronary artery disease), native coronary artery    minimal CAD of the pLAD and diagonal with 0-24% stenosis   CHF (congestive heart failure) (HCC)    Coronary artery calcification seen on CAT scan    calcium score 11 12/2018   Diabetes mellitus without complication (HCC)    Dysrhythmia    Glaucoma    Heart murmur    History of childhood heart murmur (Aortic)     Social History   Tobacco Use   Smoking status: Never   Smokeless tobacco: Never   Tobacco comments:    Never smoke 07/22/21  Vaping Use   Vaping Use: Never used  Substance Use Topics   Alcohol use: No   Drug use: No    Past Surgical History:  Procedure Laterality Date   ANTERIOR CERVICAL DECOMP/DISCECTOMY FUSION N/A 05/13/2021   Procedure: Anterior Cervical Discetomy  Fusion - Cervical five-Cervical six - Cervical six-Cervical seven;  Surgeon: CrKary KosMD;  [facility]: Neurosurgery;  Laterality: N/A;   ATRIAL FIBRILLATION ABLATION N/A 07/01/2021   Procedure: ATRIAL FIBRILLATION ABLATION;  Surgeon: LaVickie EpleyMD;  [facility];  Service: Cardiovascular;  Laterality: N/A;   BENTALL PROCEDURE  01/10/2019   [facility] 04/08/2021   Procedure: CARDIOVERSION;  Surgeon: BeJolaine ArtistMD;  [facility] Service: Cardiovascular;  Laterality: N/A;   COLONOSCOPY  2006   Dr. BuCristina Gong EYE SURGERY     [facility] [facility]   Procedure: TRANSESOPHAGEAL ECHOCARDIOGRAM (TEE);  Surgeon: BeJolaine ArtistMD;  [facility];  Service: Cardiovascular;  Laterality: N/A;   TONSILLECTOMY      Family History  Problem Relation Age of Onset   Diabetes Mother    Hypertension Mother    Diabetes Father    Heart disease Father    Diabetes Brother    Diabetes Sister     Allergies  Allergen Reactions   Penicillins     Childhood reaction     Current Medications:   Current Outpatient Medications:    Alpha-Lipoic Acid 300 MG TABS, Take 600 mg by mouth in the morning and at bedtime., Disp: , Rfl:  atorvastatin (LIPITOR) 40 MG tablet, TAKE 1 TABLET BY MOUTH DAILY AT 6 PM., Disp: 90 tablet, Rfl: 3   B Complex-C (SUPER B COMPLEX/VITAMIN C) TABS, Take 1 tablet by mouth daily., Disp: , Rfl:    Cholecalciferol (VITAMIN D3) 50 MCG (2000 UT) capsule, TAKE 1 CAPSULE BY MOUTH EVERY DAY, Disp: 90 capsule, Rfl: 3   dapagliflozin propanediol (FARXIGA) 10 MG TABS tablet, Take 1 tablet (10 mg total) by mouth daily before breakfast., Disp: 90 tablet, Rfl: 3   dorzolamide-timolol (COSOPT) 22.3-6.8 MG/ML ophthalmic solution, Place 1 drop into both eyes 2 (two) times daily., Disp: , Rfl:    ELIQUIS 5 MG TABS tablet, TAKE 1 TABLET BY MOUTH TWICE A DAY, Disp: 60 tablet, Rfl:  6   Lancets (ONETOUCH DELICA PLUS [id number]) MISC, USE TO TEST BLOOD SUGAR 3 TIMES DAILY AS INSTRUCTED., Disp: 100 each, Rfl: 4   losartan (COZAAR) 25 MG tablet, Take 1 tablet (25 mg total) by mouth daily., Disp: 90 tablet, Rfl: 3   metFORMIN (GLUCOPHAGE) 1000 MG tablet, TAKE 1 TABLET BY MOUTH TWICE A DAY WITH MEALS, Disp: 180 tablet, Rfl: 3   metoprolol succinate (TOPROL-XL) 25 MG 24 hr tablet, TAKE 1 TABLET (25 MG TOTAL) BY MOUTH DAILY., Disp: 90 tablet, Rfl: 1   ONETOUCH VERIO test strip, USE AS INSTRUCTED TO CHECK BLOOD SUGAR 2 TIMES A DAY E11.42, Disp: 100 strip, Rfl: 1   Review of Systems:   Review of Systems  Constitutional:  Negative for chills and fever.  HENT:  Positive for congestion (chest). Negative for sinus pain and sore throat.   Respiratory:  Positive for cough (yellow/green sputum). Negative for shortness of breath.     Vitals:   Vitals:   02/10/22 1127  BP: 110/66  Pulse: 71  Temp: (!) 97.3 F (36.3 C)  TempSrc: Temporal  SpO2: 98%  Weight: 206 lb 8 oz (93.7 kg)  Height: 6' 5"$  (1.956 m)     Body mass index is 24.49 kg/m.  Physical Exam:   Physical Exam Constitutional:      General: He is not in acute distress.    Appearance: Normal appearance. He is not ill-appearing.  HENT:     Head: Normocephalic and atraumatic.     Right Ear: External ear normal.     Left Ear: External ear normal.  Eyes:     Extraocular Movements: Extraocular movements intact.     Pupils: Pupils are equal, round, and reactive to light.  Cardiovascular:     Rate and Rhythm: Normal rate and regular rhythm.     Heart sounds: Normal heart sounds. No murmur heard.    No gallop.  Pulmonary:     Effort: Pulmonary effort is normal. No respiratory distress.     Breath sounds: Rhonchi present. No wheezing or rales.  Skin:    General: Skin is warm and dry.  Neurological:     Mental Status: He is alert and oriented to person, place, and time.  Psychiatric:        Judgment: Judgment  normal.     Assessment and Plan:   Acute cough No red flags on exam.   Will initiate azithromycin, tessalon perles, albuterol inhaler prn per orders. Discussed taking medications as prescribed.  Reviewed return precautions including worsening fever, SOB, worsening cough or other concerns.  Push fluids and rest.  I recommend that patient follow-up if symptoms worsen or persist despite treatment x 7-10 days, sooner if needed.  I,Verona Buck,acting as a Education administrator for  Inda Coke, PA.,have documented all relevant documentation on the behalf of Inda Coke, PA,as directed by  Inda Coke, PA while in the presence of Inda Coke, [place].  I, Inda Coke, [place], have reviewed all documentation for this visit. The documentation on 02/10/22 for the exam, diagnosis, procedures, and orders are all accurate and complete.  Inda Coke, PA-C

## 2022-02-10 NOTE — Patient Instructions (Addendum)
It was great to see you!  Start azithromcyin antibiotic Use tessalon perles for cough as needed Use albuterol inhaler for any wheezing if needed Continue mucinex  If you need an antihistamine, trial zyrtec instead of benadryl for longer-lasting effects  Push fluids and get plenty of rest. Please return if you are not improving as expected, or if you have high fevers (>101.5) or difficulty swallowing or worsening productive cough.  Call clinic with questions.  I hope you start feeling better soon!

## 2022-02-14 ENCOUNTER — Encounter: Payer: Self-pay | Admitting: Internal Medicine

## 2022-02-24 ENCOUNTER — Other Ambulatory Visit: Payer: Self-pay | Admitting: Internal Medicine

## 2022-02-24 DIAGNOSIS — E1142 Type 2 diabetes mellitus with diabetic polyneuropathy: Secondary | ICD-10-CM

## 2022-03-04 ENCOUNTER — Other Ambulatory Visit: Payer: Self-pay | Admitting: Physician Assistant

## 2022-03-08 ENCOUNTER — Ambulatory Visit (INDEPENDENT_AMBULATORY_CARE_PROVIDER_SITE_OTHER): Payer: Medicare Other | Admitting: Family Medicine

## 2022-03-08 ENCOUNTER — Encounter: Payer: Self-pay | Admitting: Family Medicine

## 2022-03-08 ENCOUNTER — Ambulatory Visit (INDEPENDENT_AMBULATORY_CARE_PROVIDER_SITE_OTHER): Payer: Medicare Other

## 2022-03-08 ENCOUNTER — Ambulatory Visit: Payer: BLUE CROSS/BLUE SHIELD | Admitting: Physician Assistant

## 2022-03-08 VITALS — BP 126/72 | HR 59 | Temp 97.6°F | Ht 77.0 in | Wt 203.0 lb

## 2022-03-08 DIAGNOSIS — R062 Wheezing: Secondary | ICD-10-CM

## 2022-03-08 DIAGNOSIS — R058 Other specified cough: Secondary | ICD-10-CM

## 2022-03-08 DIAGNOSIS — J22 Unspecified acute lower respiratory infection: Secondary | ICD-10-CM | POA: Diagnosis not present

## 2022-03-08 LAB — CBC WITH DIFFERENTIAL/PLATELET
Basophils Absolute: 0 K/uL (ref 0.0–0.1)
Basophils Relative: 0.5 % (ref 0.0–3.0)
Eosinophils Absolute: 0.4 K/uL (ref 0.0–0.7)
Eosinophils Relative: 5.1 % — ABNORMAL HIGH (ref 0.0–5.0)
HCT: 44.1 % (ref 39.0–52.0)
Hemoglobin: 14.8 g/dL (ref 13.0–17.0)
Lymphocytes Relative: 23.7 % (ref 12.0–46.0)
Lymphs Abs: 1.6 K/uL (ref 0.7–4.0)
MCHC: 33.5 g/dL (ref 30.0–36.0)
MCV: 90.9 fl (ref 78.0–100.0)
Monocytes Absolute: 0.8 K/uL (ref 0.1–1.0)
Monocytes Relative: 11.9 % (ref 3.0–12.0)
Neutro Abs: 4.1 K/uL (ref 1.4–7.7)
Neutrophils Relative %: 58.8 % (ref 43.0–77.0)
Platelets: 176 K/uL (ref 150.0–400.0)
RBC: 4.86 Mil/uL (ref 4.22–5.81)
RDW: 14.5 % (ref 11.5–15.5)
WBC: 6.9 K/uL (ref 4.0–10.5)

## 2022-03-08 MED ORDER — CEFDINIR 300 MG PO CAPS: 300.0000 mg | ORAL_CAPSULE | Freq: Two times a day (BID) | ORAL | 0 refills | Status: DC

## 2022-03-08 MED ORDER — PREDNISONE 20 MG PO TABS: 40.0000 mg | ORAL_TABLET | Freq: Every day | ORAL | 0 refills | Status: DC

## 2022-03-08 MED ORDER — BENZONATATE 100 MG PO CAPS: 100.0000 mg | ORAL_CAPSULE | Freq: Two times a day (BID) | ORAL | 0 refills | Status: DC | PRN

## 2022-03-08 MED ORDER — DOXYCYCLINE HYCLATE 100 MG PO TABS: 100.0000 mg | ORAL_TABLET | Freq: Two times a day (BID) | ORAL | 0 refills | Status: DC

## 2022-03-08 NOTE — Patient Instructions (Addendum)
  Stay well-hydrated.  Use the inhaler as needed.  Take the antibiotic with food and a full glass of water.   Take the oral steroids if wheezing, chest tightness, shortness of breath or coughing is getting worse.  Be aware this medication can cause insomnia so take it early in the day with a full glass of water and food.

## 2022-03-08 NOTE — Progress Notes (Signed)
Subjective:  Clifford Alvarez is a 67 y.o. male who presents for a productive cough, thick yellow sputum.   He also reports wheezing at times. He was seen and treated 4 wks ago for possible pneumonia with azithromycin.  States he has been taking Mucinex and using albuterol inhaler.  States he is leaving the country in 3 days and hoping to be reevaluated.  Denies fever, chills, dizziness, chest pain, palpitations, shortness of breath, abdominal pain, nausea, vomiting or diarrhea.  No other aggravating or relieving factors.  No other c/o.  ROS as in subjective.   Objective: Vitals:   03/08/22 1455  BP: 126/72  Pulse: (!) 59  Temp: 97.6 F (36.4 C)  SpO2: 99%    General appearance: Alert, WD/WN, no distress                             Skin: warm, no rash                           Head: no sinus tenderness                            Eyes: conjunctiva normal, corneas clear, PERRLA                            Ears: pearly TMs, external ear canals normal                          Nose: septum midline, turbinates swollen, with erythema             Mouth/throat: MMM, tongue normal, mild pharyngeal erythema                           Neck: supple, no adenopathy, no thyromegaly, nontender                          Heart: RRR                         Lungs: CTA bilaterally, no wheezes, rales, or rhonchi      Assessment: Lower respiratory tract infection - Plan: cefdinir (OMNICEF) 300 MG capsule  Productive cough - Plan: benzonatate (TESSALON) 100 MG capsule, DG Chest 2 View, CBC with Differential/Platelet, CBC with Differential/Platelet, predniSONE (DELTASONE) 20 MG tablet, DISCONTINUED: doxycycline (VIBRA-TABS) 100 MG tablet  Wheezing - Plan: DG Chest 2 View, CBC with Differential/Platelet, CBC with Differential/Platelet, predniSONE (DELTASONE) 20 MG tablet   Plan: Stat CBC and stat CXR ordered.  Lower respiratory tract infection. Cough productive several weeks. Z-pak ineffective.   CXR negative. Cefdinir prescribed. Oral steroid prescribed to take for worsening symptoms if out of the country.  Tessalon refilled. Suggested symptomatic OTC remedies. Follow up prn

## 2022-04-03 ENCOUNTER — Other Ambulatory Visit: Payer: Self-pay | Admitting: Internal Medicine

## 2022-05-03 ENCOUNTER — Other Ambulatory Visit: Payer: Self-pay | Admitting: Internal Medicine

## 2022-05-03 DIAGNOSIS — E1142 Type 2 diabetes mellitus with diabetic polyneuropathy: Secondary | ICD-10-CM

## 2022-05-05 LAB — HM DIABETES EYE EXAM

## 2022-05-09 ENCOUNTER — Encounter: Payer: Self-pay | Admitting: Internal Medicine

## 2022-06-01 ENCOUNTER — Encounter (HOSPITAL_COMMUNITY): Payer: Self-pay

## 2022-06-01 ENCOUNTER — Ambulatory Visit (HOSPITAL_COMMUNITY)
Admission: RE | Admit: 2022-06-01 | Discharge: 2022-06-01 | Disposition: A | Discharge status: Home / Self Care | Payer: Medicare Other | Source: Ambulatory Visit | Attending: Internal Medicine | Admitting: Internal Medicine

## 2022-06-01 VITALS — HR 49 | Wt 207.6 lb

## 2022-06-01 DIAGNOSIS — R001 Bradycardia, unspecified: Secondary | ICD-10-CM | POA: Diagnosis not present

## 2022-06-01 DIAGNOSIS — I4510 Unspecified right bundle-branch block: Secondary | ICD-10-CM | POA: Diagnosis not present

## 2022-06-01 DIAGNOSIS — I480 Paroxysmal atrial fibrillation: Secondary | ICD-10-CM | POA: Diagnosis not present

## 2022-06-13 ENCOUNTER — Other Ambulatory Visit (HOSPITAL_COMMUNITY): Payer: Self-pay | Admitting: Internal Medicine

## 2022-06-27 ENCOUNTER — Other Ambulatory Visit: Payer: Self-pay | Admitting: Internal Medicine

## 2022-06-27 ENCOUNTER — Other Ambulatory Visit (HOSPITAL_COMMUNITY): Payer: Self-pay | Admitting: Internal Medicine

## 2022-06-28 ENCOUNTER — Ambulatory Visit (INDEPENDENT_AMBULATORY_CARE_PROVIDER_SITE_OTHER): Payer: Medicare Other | Admitting: Internal Medicine

## 2022-06-28 ENCOUNTER — Encounter: Payer: Self-pay | Admitting: Internal Medicine

## 2022-06-28 VITALS — BP 120/70 | HR 60 | Temp 98.6°F | Ht 77.0 in | Wt 206.0 lb

## 2022-06-28 DIAGNOSIS — I10 Essential (primary) hypertension: Secondary | ICD-10-CM

## 2022-06-28 DIAGNOSIS — I2510 Atherosclerotic heart disease of native coronary artery without angina pectoris: Secondary | ICD-10-CM

## 2022-06-28 DIAGNOSIS — E559 Vitamin D deficiency, unspecified: Secondary | ICD-10-CM | POA: Diagnosis not present

## 2022-06-28 DIAGNOSIS — E7800 Pure hypercholesterolemia, unspecified: Secondary | ICD-10-CM

## 2022-06-28 DIAGNOSIS — J22 Unspecified acute lower respiratory infection: Secondary | ICD-10-CM | POA: Diagnosis not present

## 2022-06-28 DIAGNOSIS — N320 Bladder-neck obstruction: Secondary | ICD-10-CM | POA: Diagnosis not present

## 2022-06-28 DIAGNOSIS — Z7984 Long term (current) use of oral hypoglycemic drugs: Secondary | ICD-10-CM | POA: Diagnosis not present

## 2022-06-28 DIAGNOSIS — E1142 Type 2 diabetes mellitus with diabetic polyneuropathy: Secondary | ICD-10-CM

## 2022-06-28 DIAGNOSIS — Z7184 Encounter for health counseling related to travel: Secondary | ICD-10-CM | POA: Insufficient documentation

## 2022-06-28 LAB — COMPREHENSIVE METABOLIC PANEL
ALT: 17 U/L (ref 0–53)
AST: 19 U/L (ref 0–37)
Albumin: 4.2 g/dL (ref 3.5–5.2)
Alkaline Phosphatase: 78 U/L (ref 39–117)
BUN: 20 mg/dL (ref 6–23)
CO2: 30 mEq/L (ref 19–32)
Calcium: 9.3 mg/dL (ref 8.4–10.5)
Chloride: 102 mEq/L (ref 96–112)
Creatinine, Ser: 1.1 mg/dL (ref 0.40–1.50)
GFR: 69.12 mL/min (ref >60.00)
Glucose, Bld: 152 mg/dL — ABNORMAL HIGH (ref 70–99)
Potassium: 4.7 mEq/L (ref 3.5–5.1)
Sodium: 139 mEq/L (ref 135–145)
Total Bilirubin: 0.7 mg/dL (ref 0.2–1.2)
Total Protein: 6.6 g/dL (ref 6.0–8.3)

## 2022-06-28 LAB — CBC WITH DIFFERENTIAL/PLATELET
Basophils Absolute: 0 10*3/uL (ref 0.0–0.1)
Basophils Relative: 0.3 % (ref 0.0–3.0)
Eosinophils Absolute: 0.2 10*3/uL (ref 0.0–0.7)
Eosinophils Relative: 3.7 % (ref 0.0–5.0)
HCT: 44.5 % (ref 39.0–52.0)
Hemoglobin: 14.3 g/dL (ref 13.0–17.0)
Lymphocytes Relative: 31.3 % (ref 12.0–46.0)
Lymphs Abs: 1.8 10*3/uL (ref 0.7–4.0)
MCHC: 32.2 g/dL (ref 30.0–36.0)
MCV: 92.5 fl (ref 78.0–100.0)
Monocytes Absolute: 0.6 10*3/uL (ref 0.1–1.0)
Monocytes Relative: 9.8 % (ref 3.0–12.0)
Neutro Abs: 3.2 10*3/uL (ref 1.4–7.7)
Neutrophils Relative %: 54.9 % (ref 43.0–77.0)
Platelets: 144 10*3/uL — ABNORMAL LOW (ref 150.0–400.0)
RBC: 4.81 Mil/uL (ref 4.22–5.81)
RDW: 13.3 % (ref 11.5–15.5)
WBC: 5.9 10*3/uL (ref 4.0–10.5)

## 2022-06-28 LAB — LDL CHOLESTEROL, DIRECT: Direct LDL: 57 mg/dL

## 2022-06-28 LAB — LIPID PANEL
Cholesterol: 111 mg/dL (ref 0–200)
HDL: 30.2 mg/dL — ABNORMAL LOW (ref >39.00)
NonHDL: 81.2
Total CHOL/HDL Ratio: 4
Triglycerides: 201 mg/dL — ABNORMAL HIGH (ref 0.0–149.0)
VLDL: 40.2 mg/dL — ABNORMAL HIGH (ref 0.0–40.0)

## 2022-06-28 LAB — URINALYSIS
Bilirubin Urine: NEGATIVE
Hgb urine dipstick: NEGATIVE
Ketones, ur: NEGATIVE
Leukocytes,Ua: NEGATIVE
Nitrite: NEGATIVE
Specific Gravity, Urine: 1.01 (ref 1.000–1.030)
Total Protein, Urine: NEGATIVE
Urine Glucose: >=1000 — AB
Urobilinogen, UA: 0.2 (ref 0.0–1.0)
pH: 6.5 (ref 5.0–8.0)

## 2022-06-28 LAB — MICROALBUMIN / CREATININE URINE RATIO
Creatinine,U: 55.4 mg/dL
Microalb Creat Ratio: 1.3 mg/g (ref 0.0–30.0)
Microalb, Ur: <0.7 mg/dL (ref 0.0–1.9)

## 2022-06-28 LAB — PSA: PSA: 1.15 ng/mL (ref 0.10–4.00)

## 2022-06-28 LAB — HEMOGLOBIN A1C: Hgb A1c MFr Bld: 6.2 % (ref 4.6–6.5)

## 2022-06-28 LAB — TSH: TSH: 2.96 u[IU]/mL (ref 0.35–5.50)

## 2022-06-28 MED ORDER — CEFDINIR 300 MG PO CAPS: 300.0000 mg | ORAL_CAPSULE | Freq: Two times a day (BID) | ORAL | 0 refills | Status: DC

## 2022-06-28 NOTE — Assessment & Plan Note (Signed)
On Vit D 

## 2022-06-28 NOTE — Assessment & Plan Note (Signed)
[place] - July 2024 [facility] Rx given

## 2022-06-28 NOTE — Assessment & Plan Note (Signed)
On Eliquis, Lipitor No angina

## 2022-06-28 NOTE — Assessment & Plan Note (Signed)
Cont on Lipitor 

## 2022-06-28 NOTE — Assessment & Plan Note (Addendum)
On Farxiga, Metformin, Losartan,  Lipitor A1c was OK F/u w/Dr Elvera Lennox

## 2022-06-28 NOTE — Progress Notes (Signed)
Subjective:  Patient ID: Clifford Alvarez, male    DOB: September 07, 1955  Age: 67 y.o. MRN: [id number]  CC: Annual Exam   HPI Clifford Alvarez presents for dyslipidemia, CAD, DM Traveling to [place] soon  Outpatient Medications Prior to Visit  Medication Sig Dispense Refill   albuterol (VENTOLIN HFA) 108 (90 Base) MCG/ACT inhaler Inhale 2 puffs into the lungs every 6 (six) hours as needed for wheezing or shortness of breath. 8 g 0   Alpha-Lipoic Acid 300 MG TABS Take 600 mg by mouth in the morning and at bedtime.     apixaban (ELIQUIS) 5 MG TABS tablet TAKE 1 TABLET BY MOUTH TWICE A DAY 60 tablet 11   atorvastatin (LIPITOR) 40 MG tablet TAKE 1 TABLET BY MOUTH DAILY AT 6 PM. 90 tablet 3   B Complex-C (SUPER B COMPLEX/VITAMIN C) TABS Take 1 tablet by mouth daily.     benzonatate (TESSALON) 100 MG capsule Take 1 capsule (100 mg total) by mouth 2 (two) times daily as needed for cough. 20 capsule 0   Cholecalciferol (VITAMIN D3) 50 MCG (2000 UT) capsule TAKE 1 CAPSULE BY MOUTH EVERY DAY 90 capsule 3   dapagliflozin propanediol (FARXIGA) 10 MG TABS tablet Take 1 tablet (10 mg total) by mouth daily before breakfast. 90 tablet 3   dorzolamide-timolol (COSOPT) 22.3-6.8 MG/ML ophthalmic solution Place 1 drop into both eyes 2 (two) times daily.     glucose blood (ONETOUCH VERIO) test strip USE AS INSTRUCTED TO CHECK BLOOD SUGAR 2 TIMES A DAY E11.42 100 strip 3   Lancets (ONETOUCH DELICA PLUS [id number]) MISC USE TO TEST BLOOD SUGAR 3 TIMES DAILY AS INSTRUCTED. 100 each 4   losartan (COZAAR) 25 MG tablet Take 1 tablet (25 mg total) by mouth daily. 90 tablet 3   metFORMIN (GLUCOPHAGE) 1000 MG tablet TAKE 1 TABLET BY MOUTH TWICE A DAY WITH FOOD 180 tablet 3   metoprolol succinate (TOPROL-XL) 25 MG 24 hr tablet TAKE 1 TABLET (25 MG TOTAL) BY MOUTH DAILY. 90 tablet 1   cefdinir (OMNICEF) 300 MG capsule Take 1 capsule (300 mg total) by mouth 2 (two) times daily. 14 capsule 0   predniSONE (DELTASONE) 20 MG  tablet Take 2 tablets (40 mg total) by mouth daily with breakfast. 10 tablet 0   No facility-administered medications prior to visit.    ROS: Review of Systems  Constitutional:  Negative for appetite change, fatigue and unexpected weight change.  HENT:  Negative for congestion, nosebleeds, sneezing, sore throat and trouble swallowing.   Eyes:  Negative for itching and visual disturbance.  Respiratory:  Negative for cough.   Cardiovascular:  Negative for chest pain, palpitations and leg swelling.  Gastrointestinal:  Negative for abdominal distention, blood in stool, diarrhea and nausea.  Genitourinary:  Negative for frequency and hematuria.  Musculoskeletal:  Negative for back pain, gait problem, joint swelling and neck pain.  Skin:  Negative for rash.  Neurological:  Negative for dizziness, tremors, speech difficulty and weakness.  Psychiatric/Behavioral:  Negative for agitation, dysphoric mood and sleep disturbance. The patient is not nervous/anxious.     Objective:  BP 120/70 (BP Location: Right Arm, Patient Position: Sitting, Cuff Size: Normal)   Pulse 60   Temp 98.6 F (37 C) (Oral)   Ht 6\' 5"  (1.956 m)   Wt 206 lb (93.4 kg)   SpO2 95%   BMI 24.43 kg/m   BP Readings from Last 3 Encounters:  06/28/22 120/70  03/08/22 126/72  02/10/22 110/66  Wt Readings from Last 3 Encounters:  06/28/22 206 lb (93.4 kg)  06/01/22 207 lb 9.6 oz (94.2 kg)  03/08/22 203 lb (92.1 kg)    Physical Exam Constitutional:      General: He is not in acute distress.    Appearance: He is well-developed.     Comments: NAD  Eyes:     Conjunctiva/sclera: Conjunctivae normal.     Pupils: Pupils are equal, round, and reactive to light.  Neck:     Thyroid: No thyromegaly.     Vascular: No JVD.  Cardiovascular:     Rate and Rhythm: Normal rate and regular rhythm.     Heart sounds: Normal heart sounds. No murmur heard.    No friction rub. No gallop.  Pulmonary:     Effort: Pulmonary effort  is normal. No respiratory distress.     Breath sounds: Normal breath sounds. No wheezing or rales.  Chest:     Chest wall: No tenderness.  Abdominal:     General: Bowel sounds are normal. There is no distension.     Palpations: Abdomen is soft. There is no mass.     Tenderness: There is no abdominal tenderness. There is no guarding or rebound.  Musculoskeletal:        General: No tenderness. Normal range of motion.     Cervical back: Normal range of motion.  Lymphadenopathy:     Cervical: No cervical adenopathy.  Skin:    General: Skin is warm and dry.     Findings: No rash.  Neurological:     Mental Status: He is alert and oriented to person, place, and time.     Cranial Nerves: No cranial nerve deficit.     Motor: No abnormal muscle tone.     Coordination: Coordination normal.     Gait: Gait normal.     Deep Tendon Reflexes: Reflexes are normal and symmetric.  Psychiatric:        Behavior: Behavior normal.        Thought Content: Thought content normal.        Judgment: Judgment normal.     Lab Results  Component Value Date   WBC 6.9 03/08/2022   HGB 14.8 03/08/2022   HCT 44.1 03/08/2022   PLT 176.0 03/08/2022   GLUCOSE 89 06/09/2021   CHOL 118 06/06/2021   TRIG 145.0 06/06/2021   HDL 34.70 (L) 06/06/2021   LDLDIRECT 84.0 11/04/2018   LDLCALC 54 06/06/2021   ALT 18 03/31/2021   AST 20 03/31/2021   NA 141 06/09/2021   K 5.2 06/09/2021   CL 105 06/09/2021   CREATININE 1.10 06/09/2021   BUN 22 06/09/2021   CO2 28 06/09/2021   TSH 2.385 03/31/2021   PSA 0.72 07/09/2019   HGBA1C 5.7 (A) 12/12/2021   MICROALBUR <0.7 07/09/2019    No results found.  Assessment & Plan:   Problem List Items Addressed This Visit     Type 2 diabetes, controlled, with peripheral neuropathy (HCC) - Primary (Chronic)    On Farxiga, Metformin, Losartan,  Lipitor A1c was OK F/u w/Dr Elvera Lennox      Relevant Orders   TSH   Urinalysis   CBC with Differential/Platelet   Lipid panel    PSA   Comprehensive metabolic panel   Cologuard   Hemoglobin A1c   Microalbumin / creatinine urine ratio   Hyperlipidemia    Cont on Lipitor      Hypertension    On Losartan  Vitamin D deficiency    On Vit D      Relevant Orders   TSH   Urinalysis   CBC with Differential/Platelet   Lipid panel   PSA   Comprehensive metabolic panel   Cologuard   Hemoglobin A1c   Microalbumin / creatinine urine ratio   CAD (coronary artery disease), native coronary artery    On Eliquis, Lipitor No angina      Travel advice encounter    [place] - July 2024 [facility] Rx given      Other Visit Diagnoses     Lower respiratory tract infection       Relevant Medications   cefdinir (OMNICEF) 300 MG capsule   Other Relevant Orders   TSH   Urinalysis   CBC with Differential/Platelet   Lipid panel   PSA   Comprehensive metabolic panel   Cologuard   Hemoglobin A1c   Microalbumin / creatinine urine ratio   Bladder neck obstruction       Relevant Orders   PSA         Meds ordered this encounter  Medications   cefdinir (OMNICEF) 300 MG capsule    Sig: Take 1 capsule (300 mg total) by mouth 2 (two) times daily.    Dispense:  20 capsule    Refill:  0      Follow-up: Return in about 6 months (around 12/28/2022) for a follow-up visit.  Sonda Primes, MD

## 2022-06-28 NOTE — Assessment & Plan Note (Signed)
On Losartan 

## 2022-07-03 ENCOUNTER — Ambulatory Visit (INDEPENDENT_AMBULATORY_CARE_PROVIDER_SITE_OTHER): Payer: Medicare Other | Admitting: Internal Medicine

## 2022-07-03 ENCOUNTER — Encounter: Payer: Self-pay | Admitting: Internal Medicine

## 2022-07-03 VITALS — BP 122/74 | HR 54 | Ht 77.0 in | Wt 204.8 lb

## 2022-07-03 DIAGNOSIS — E1159 Type 2 diabetes mellitus with other circulatory complications: Secondary | ICD-10-CM

## 2022-07-03 DIAGNOSIS — G63 Polyneuropathy in diseases classified elsewhere: Secondary | ICD-10-CM

## 2022-07-03 DIAGNOSIS — Z7984 Long term (current) use of oral hypoglycemic drugs: Secondary | ICD-10-CM | POA: Diagnosis not present

## 2022-07-03 DIAGNOSIS — E7800 Pure hypercholesterolemia, unspecified: Secondary | ICD-10-CM | POA: Diagnosis not present

## 2022-07-03 DIAGNOSIS — E119 Type 2 diabetes mellitus without complications: Secondary | ICD-10-CM

## 2022-07-03 NOTE — Patient Instructions (Addendum)
Please change: - Metformin 2000 mg at dinnertime  Continue: - Farxiga 5-10 mg daily before breakfast    Please return in 6 months with your sugar log.

## 2022-07-03 NOTE — Progress Notes (Signed)
Patient ID: Clifford Alvarez, male   DOB: [birth year], 67 y.o.   MRN: [id number]  HPI: Clifford Alvarez is a 67 y.o.-year-old male, returning for f/u for DM2 dx 01/28/2013, previously insulin-dependent, now off insulin, controlled, with complications (CAD, Peripheral neuropathy). Last visit was 6 months ago.  Interim history: No increased urination, blurry vision, nausea, chest pain. He had on URI in 02/2022 >> sugars slightly higher since then. He is preparing to travel to [place] for a safari in few days.  Reviewed HbA1c levels: Lab Results  Component Value Date   HGBA1C 6.2 06/28/2022   HGBA1C 5.7 (A) 12/12/2021   HGBA1C 6.1 (H) 05/12/2021   HGBA1C 6.0 (A) 12/13/2020   HGBA1C 5.9 (A) 06/01/2020   HGBA1C 5.9 (A) 12/08/2019   HGBA1C 6.4 07/09/2019   HGBA1C 6.4 (A) 06/30/2019   HGBA1C 6.0 (A) 02/12/2019   HGBA1C 6.0 (A) 11/11/2018   HGBA1C 6.1 (A) 05/20/2018   HGBA1C 5.8 (A) 11/19/2017   HGBA1C 6.0 05/14/2017   HGBA1C 6 01/08/2017   HGBA1C 6.3 10/26/2016   HGBA1C 5.7 08/07/2016   HGBA1C 6.0 04/17/2016   HGBA1C 6.0 12/13/2015   HGBA1C 5.9 08/12/2015   HGBA1C 5.9 04/13/2015   He is on: - Metformin 1000 mg 2x a day with meals - Farxiga 5 (-10) mg daily  He was previously on Basaglar 12 units at bedtime >> stopped 08/2016 >> restarted: 6-8 units 3x a day >> now off completely. He was previously on [place].  Pt checks his sugars once a day: - am: 110-140s >> 97-146 >> 90, 115-135 >> 100-140, usually 120s >> 120, 135-145, 150, 175 - 2h after b'fast: 146 >> 197 >> n/c  - before lunch:  102, 128 >> n/c - 2h after lunch: 1n/c >> 126 >> n/c - before dinner: n/c >> n/c >> 124-142 >> n/c - 2h after dinner:  110-140s >> 130-140 >> n/c - bedtime:  120-174 >> n/c - nighttime:  123-145, 170 >> n/c Lowest sugar was 97 >> 90 >> 100 >> 112.  He  has hypoglycemia awareness in the 70s. Highest sugar was 146 >> 135 >> 140 >> 175.  Meter: [employer] >> One Pharmacologist.  Pt's meals are: - Breakfast: bagel, oatmeal, granola, eggs - Lunch: meat + 2-3 vegetables; soup; salad; sandwich - Dinner: meat + 2-3 vegetables; pasta; [place]; seafood - Snacks: 1 a day >> nuts, popcorn, dark chocolate  -No CKD, last BUN/creatinine:  Lab Results  Component Value Date   BUN 20 06/28/2022   CREATININE 1.10 06/28/2022   Lab Results  Component Value Date   GFRNONAA >60 05/12/2021   GFRNONAA >60 03/31/2021   GFRNONAA >60 11/05/2019   GFRNONAA >60 01/24/2019   GFRNONAA >89 04/13/2015   No available: Lab Results  Component Value Date   MICRALBCREAT 1.3 06/28/2022   MICRALBCREAT 0.5 07/09/2019   MICRALBCREAT 0.6 11/04/2018   MICRALBCREAT 1.6 10/29/2017   MICRALBCREAT 0.8 10/26/2016   MICRALBCREAT 0.6 04/13/2015   MICRALBCREAT 0.7 05/11/2014   MICRALBCREAT 0.3 03/12/2013  On losartan.  -+ HL; reviewed latest lipid panel: Lab Results  Component Value Date   CHOL 111 06/28/2022   HDL 30.20 (L) 06/28/2022   LDLCALC 54 06/06/2021   LDLDIRECT 57.0 06/28/2022   TRIG 201.0 (H) 06/28/2022   CHOLHDL 4 06/28/2022  On Lipitor 40.  - last eye exam was on 05/05/2022: No DR, + primary open-angle glaucoma OU at [facility]. Dr. Lottie Dawson.  He is seen every  4 mo - has frequent VF tests.  He had cataract surgeries.  - He has numbness but no tingling in his feet.  Last foot exam 12/12/2021.  He is status post ascending aortic aneurysm repair at [facility] clinic in 01/2018. He had cardioversion and ablation in 2023. He has persistent A-fib, CAD, severe OSA. He had discectomy 05/13/2021. Pain and numbness in L arm resolved.  ROS: + see HPI  I reviewed pt's medications, allergies, PMH, social hx, family hx, and changes were documented in the history of present illness. Otherwise, unchanged from my initial visit note.  Past Medical History:  Diagnosis Date   Ascending aortic aneurysm (HCC)    5.4cm ascending aorta by Chest CT 12/2018, s/p thoracic aortic aneurysm  repair with Biobentall 25mm Perimount Valve, 30mm graft   Bicuspid aortic valve    no AS or AI on echo 12/2018   CAD (coronary artery disease), native coronary artery    minimal CAD of the pLAD and diagonal with 0-24% stenosis   CHF (congestive heart failure) (HCC)    Coronary artery calcification seen on CAT scan    calcium score 11 12/2018   Diabetes mellitus without complication (HCC)    Dysrhythmia    Glaucoma    Heart murmur    History of childhood heart murmur (Aortic)   Past Surgical History:  Procedure Laterality Date   ANTERIOR CERVICAL DECOMP/DISCECTOMY FUSION N/A 05/13/2021   Procedure: Anterior Cervical Discetomy Fusion - Cervical five-Cervical six - Cervical six-Cervical seven;  Surgeon: Donalee Citrin, MD;  Location: [facility] OR;  Service: Neurosurgery;  Laterality: N/A;   ATRIAL FIBRILLATION ABLATION N/A 07/01/2021   Procedure: ATRIAL FIBRILLATION ABLATION;  Surgeon: Lanier Prude, MD;  Location: [facility] INVASIVE CV LAB;  Service: Cardiovascular;  Laterality: N/A;   BENTALL PROCEDURE  01/10/2019   at [facility]   CARDIOVERSION N/A 04/08/2021   Procedure: CARDIOVERSION;  Surgeon: Dolores Patty, MD;  Location: [facility] ENDOSCOPY;  Service: Cardiovascular;  Laterality: N/A;   COLONOSCOPY  2006   Dr. 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  Tobacco Use   Smoking status: Never   Smokeless tobacco: Never   Tobacco comments:    Never smoke 07/22/21  Vaping Use   Vaping Use: Never used  Substance and  Sexual Activity   Alcohol use: No   Drug use: No   Sexual activity: Not on file  Other Topics Concern   Not on file  Social History Narrative   Married 38 years   Wife - Barbarann Ehlers 28   Daughter 57   Social Determinants of Health   Financial Resource Strain: Low Risk  (12/22/2021)   Overall Financial Resource Strain (CARDIA)    Difficulty of Paying Living Expenses: Not hard at all  Food Insecurity: No Food Insecurity (12/22/2021)   Hunger Vital Sign    Worried About Running Out of Food in the Last Year: Never true    Ran Out of Food  in the Last Year: Never true  Transportation Needs: No Transportation Needs (12/22/2021)   PRAPARE - Administrator, Civil Service (Medical): No    Lack of Transportation (Non-Medical): No  Physical Activity: Sufficiently Active (12/22/2021)   Exercise Vital Sign    Days of Exercise per Week: 5 days    Minutes of Exercise per Session: 30 min  Stress: No Stress Concern Present (12/22/2021)   [employer] of Occupational Health - Occupational Stress Questionnaire    Feeling of Stress : Not at all  Social Connections: Socially Integrated (12/22/2021)   Social Connection and Isolation Panel [NHANES]    Frequency of Communication with Friends and Family: More than three times a week    Frequency of Social Gatherings with Friends and Family: More than three times a week    Attends Religious Services: More than 4 times per year    Active Member of [employer] or Organizations: Yes    Attends Engineer, structural: More than 4 times per year    Marital Status: Married  Catering manager Violence: Not At Risk (12/22/2021)   Humiliation, Afraid, Rape, and Kick questionnaire    Fear of Current or Ex-Partner: No    Emotionally Abused: No    Physically Abused: No    Sexually Abused: No   Current Outpatient Medications on File Prior to Visit  Medication Sig Dispense Refill   albuterol (VENTOLIN HFA) 108 (90 Base) MCG/ACT inhaler  Inhale 2 puffs into the lungs every 6 (six) hours as needed for wheezing or shortness of breath. 8 g 0   Alpha-Lipoic Acid 300 MG TABS Take 600 mg by mouth in the morning and at bedtime.     apixaban (ELIQUIS) 5 MG TABS tablet TAKE 1 TABLET BY MOUTH TWICE A DAY 60 tablet 11   atorvastatin (LIPITOR) 40 MG tablet TAKE 1 TABLET BY MOUTH DAILY AT 6 PM. 90 tablet 3   B Complex-C (SUPER B COMPLEX/VITAMIN C) TABS Take 1 tablet by mouth daily.     benzonatate (TESSALON) 100 MG capsule Take 1 capsule (100 mg total) by mouth 2 (two) times daily as needed for cough. 20 capsule 0   cefdinir (OMNICEF) 300 MG capsule Take 1 capsule (300 mg total) by mouth 2 (two) times daily. 20 capsule 0   Cholecalciferol (VITAMIN D3) 50 MCG (2000 UT) capsule TAKE 1 CAPSULE BY MOUTH EVERY DAY 90 capsule 3   dapagliflozin propanediol (FARXIGA) 10 MG TABS tablet Take 1 tablet (10 mg total) by mouth daily before breakfast. 90 tablet 3   dorzolamide-timolol (COSOPT) 22.3-6.8 MG/ML ophthalmic solution Place 1 drop into both eyes 2 (two) times daily.     glucose blood (ONETOUCH VERIO) test strip USE AS INSTRUCTED TO CHECK BLOOD SUGAR 2 TIMES A DAY E11.42 100 strip 3   Lancets (ONETOUCH DELICA PLUS LANCET33G) MISC USE TO TEST BLOOD SUGAR 3 TIMES DAILY AS INSTRUCTED. 100 each 4   losartan (COZAAR) 25 MG tablet Take 1 tablet (25 mg total) by mouth daily. 90 tablet 3   metFORMIN (GLUCOPHAGE) 1000 MG tablet TAKE 1 TABLET BY MOUTH TWICE A DAY WITH FOOD 180 tablet 3   metoprolol succinate (TOPROL-XL) 25 MG 24 hr tablet TAKE 1 TABLET (25 MG TOTAL) BY MOUTH DAILY. 90 tablet 1   No current facility-administered medications on file prior to visit.   Allergies  Allergen Reactions   Penicillins     Childhood reaction    Family History  Problem Relation Age of Onset  Diabetes Mother    Hypertension Mother    Diabetes Father    Heart disease Father    Diabetes Brother    Diabetes Sister    PE: There were no vitals taken for this  visit.   Wt Readings from Last 3 Encounters:  06/28/22 206 lb (93.4 kg)  06/01/22 207 lb 9.6 oz (94.2 kg)  03/08/22 203 lb (92.1 kg)   Constitutional: Slightly overweight, in NAD Eyes: EOMI, no exophthalmos ENT: no thyromegaly, no cervical lymphadenopathy Cardiovascular: RRR, No MRG Respiratory: CTA B Musculoskeletal: no deformities Skin:no rashes Neurological: no tremor with outstretched hands  ASSESSMENT: 1. DM2, insulin-independent, controlled, with complications  - CAD - PN -stable  2. PN 2/2 diabetes  3. HL  PLAN:  1. Patient with well-controlled type 2 diabetes, on oral antidiabetic regimen with metformin and SGLT2 inhibitor.  At last visit, HbA1c was 5.7%, improved, but he had another HbA1c which was higher few days ago, at 6.2%.  At last visit, sugars were usually at goal in the morning with only occasional mild hyperglycemic values at about 140s.  He was not still not checking later in the day and I again advised him to do so.  We did not change his regimen. -At today's visit, he mentions that he noticed that sugars are higher after an upper respiratory infection/congestion approximately 3 to 4 months ago.  They are slightly higher in the morning but he is still not checking sugars later in the day.  We again discussed about why this is important and advised him to at least check some of these in the 2 weeks prior to our next visit.  For now, to improve his sugars in the morning I advised him to try to move the entire dose of metformin at night.  If he cannot tolerate this, I advised him to let me know so I can send metformin ER to his pharmacy.  If this is not enough to improve his morning sugars, I would advise him to take the 10 mg of Farxiga consistently (currently using the 5 mg).  If this is still not enough, we may need to escalate his regimen. - I suggested to: Patient Instructions  Please change: - Metformin 2000 mg at dinnertime  Continue: - Farxiga 5-10 mg daily  before breakfast    Please return in 6 months with your sugar log.   - advised to check sugars at different times of the day - 1x a day, rotating check times - advised for yearly eye exams >> he is UTD - on last foot exam desquamative rash on bilateral soles and between toes.  He also has onychodystrophy.  We discussed about possible options for treatment.  He tried Vicks vapor rub in the past, but he did not feel that this worked well for him.  I did suggest he see podiatry. - return to clinic in 6 months  2. PN 2/2 DM  -He continues to have numbness in his feet, but no pain -He is on alpha lipoid acid and B complex, which are helping  3. HL -Latest lipid panel was reviewed from 06/28/2022: LDL at goal, HDL slightly low, triglycerides high: Lab Results  Component Value Date   CHOL 111 06/28/2022   HDL 30.20 (L) 06/28/2022   LDLCALC 54 06/06/2021   LDLDIRECT 57.0 06/28/2022   TRIG 201.0 (H) 06/28/2022   CHOLHDL 4 06/28/2022  -He is on Lipitor 40 mg daily without side effects  [name], MD PhD [facility]  Endocrinology

## 2022-07-12 ENCOUNTER — Other Ambulatory Visit (HOSPITAL_COMMUNITY): Payer: Self-pay | Admitting: Internal Medicine

## 2022-08-18 ENCOUNTER — Telehealth: Payer: Self-pay | Admitting: Internal Medicine

## 2022-08-18 DIAGNOSIS — Z1211 Encounter for screening for malignant neoplasm of colon: Secondary | ICD-10-CM

## 2022-08-18 NOTE — Telephone Encounter (Signed)
Patient called and said he was supposed to receive Cologuard. The company called him and said they had the wrong code with the order sent to them and it needs to be fixed for the patient to receive it. The patient said the code is Z12.81XB14.78. Best callback is [phone].

## 2022-08-18 NOTE — Telephone Encounter (Signed)
Place another order for cologuard..[employer]

## 2022-09-24 ENCOUNTER — Other Ambulatory Visit (HOSPITAL_COMMUNITY): Payer: Self-pay | Admitting: Internal Medicine

## 2022-10-07 NOTE — Progress Notes (Unsigned)
Cardiology Office Note:  .   Date:  10/07/2022  ID:  Clifford Alvarez, DOB May 31, 1955, MRN [id number] PCP: Tresa Garter, MD  [facility] Providers Cardiologist:  Armanda Magic, MD Electrophysiologist:  Lanier Prude, MD  Advanced Heart Failure:  Arvilla Meres, MD {  History of Present Illness: .   Clifford Alvarez is a 67 y.o. male w/PMHx of HTN, DM, VHD (bicuspid AV > AVR/root replacement/Bentall 01/10/2019), no significant CAD, AFib, OSA w/CPAP  2023 new AFib/Flutter > reduced LVEF AFib ablation June 2023  Saw Dr. Gala Romney Oct 2023, doing well, no symptoms of Afib, started on CPAP BP lower side and ARB reduced  Also saw Dr. Lalla Brothers Oct 2023, no symptoms of AF, discussed OAC, planned to continue. If any plans for discontinuing OAC would need/recommend reliable rhythm monitoring with probably a loop No changes were made  Today's visit is scheduled as a 12 mo visit  ROS: ***  *** symptoms, volume, AF *** eliquis, labs, bleeding, dose *** HF f/u?  Arrhythmia/AAD hx Afib diagnosed 2023 PVI and CTI ablation 06/21/21   Studies Reviewed: Marland Kitchen    EKG not done today   07/01/2021: EPS/ablation  CONCLUSIONS: 1. Successful PVI 2. Successful ablation/isolation of the posterior wall 3. Successful ablation of the cavotricuspid isthmus for typical atrial flutter 4. Intracardiac echo reveals trivial pericardial effusion, normal left atrial architecture 5. No early apparent complications. 6. Colchicine 0.6mg  PO BID x 5 days 7. Protonix 40mg  PO daily x 45 days   04/08/21: TEE 1. Left ventricular ejection fraction, by estimation, is 45 to 50%. The  left ventricle has mildly decreased function. The left ventricle has no  regional wall motion abnormalities.   2. Right ventricular systolic function is mildly reduced. The right  ventricular size is normal.   3. Left atrial size was moderately dilated. No left atrial/left atrial  appendage thrombus was detected.    4. Right atrial size was moderately dilated.   5. The mitral valve is normal in structure. Trivial mitral valve  regurgitation. No evidence of mitral stenosis.   6. The aortic valve has been repaired/replaced. Aortic valve  regurgitation is not visualized. No aortic stenosis is present. There is a  unknown bioprosthetic valve present in the aortic position.   7. There is mild (Grade II) plaque involving the descending aorta.   8. The inferior vena cava is normal in size with greater than 50%  respiratory variability, suggesting right atrial pressure of 3 mmHg.   Conclusion(s)/Recommendation(s): Normal biventricular function without  evidence of hemodynamically significant valvular heart disease.    Risk Assessment/Calculations:    Physical Exam:   VS:  There were no vitals taken for this visit.   Wt Readings from Last 3 Encounters:  07/03/22 204 lb 12.8 oz (92.9 kg)  06/28/22 206 lb (93.4 kg)  06/01/22 207 lb 9.6 oz (94.2 kg)    GEN: Well nourished, well developed in no acute distress NECK: No JVD; No carotid bruits CARDIAC: ***RRR, no murmurs, rubs, gallops RESPIRATORY:  *** CTA b/l without rales, wheezing or rhonchi  ABDOMEN: Soft, non-tender, non-distended EXTREMITIES:  No edema; No deformity     ASSESSMENT AND PLAN: .    persistent AFib Typical AFlutter CHA2DS2Vasc is 3, on Eliquis, *** appropriately dosed ***  VHD H/o Bentall/AVR/root replacement) Functioning well by TEE last year  NICM TEE last w/LVEF 45-50%  Secondary hypercoagulable state 2/2 AFib     {Are you ordering a CV Procedure (e.g. stress test, cath,  DCCV, TEE, etc)?   Press F2        :[id number]}     Dispo: ***  Signed, Sheilah Pigeon, PA-C

## 2022-10-09 ENCOUNTER — Ambulatory Visit: Payer: Medicare Other | Attending: Physician Assistant | Admitting: Physician Assistant

## 2022-10-09 ENCOUNTER — Encounter: Payer: Self-pay | Admitting: Physician Assistant

## 2022-10-09 VITALS — BP 118/58 | HR 57 | Ht 77.0 in | Wt 211.2 lb

## 2022-10-09 DIAGNOSIS — I428 Other cardiomyopathies: Secondary | ICD-10-CM | POA: Diagnosis present

## 2022-10-09 DIAGNOSIS — I4819 Other persistent atrial fibrillation: Secondary | ICD-10-CM | POA: Insufficient documentation

## 2022-10-09 DIAGNOSIS — Z952 Presence of prosthetic heart valve: Secondary | ICD-10-CM | POA: Diagnosis present

## 2022-10-09 DIAGNOSIS — D6869 Other thrombophilia: Secondary | ICD-10-CM | POA: Diagnosis present

## 2022-10-09 NOTE — Patient Instructions (Signed)
Medication Instructions:   Your physician recommends that you continue on your current medications as directed. Please refer to the Current Medication list given to you today.  *If you need a refill on your cardiac medications before your next appointment, please call your pharmacy*   Lab Work: NONE ORDERED  TODAY   If you have labs (blood work) drawn today and your tests are completely normal, you will receive your results only by: MyChart Message (if you have MyChart) OR A paper copy in the mail If you have any lab test that is abnormal or we need to change your treatment, we will call you to review the results.   Testing/Procedures: NONE ORDERED  TODAY    Follow-Up: At [facility], you and your health needs are our priority.  As part of our continuing mission to provide you with exceptional heart care, we have created designated Provider Care Teams.  These Care Teams include your primary Cardiologist (physician) and Advanced Practice Providers (APPs -  Physician Assistants and Nurse Practitioners) who all work together to provide you with the care you need, when you need it.  We recommend signing up for the patient portal called "MyChart".  Sign up information is provided on this After Visit Summary.  MyChart is used to connect with patients for Virtual Visits (Telemedicine).  Patients are able to view lab/test results, encounter notes, upcoming appointments, etc.  Non-urgent messages can be sent to your provider as well.   To learn more about what you can do with MyChart, go to [email or web address].    Your next appointment:  DR TURNER  OVER DUE  (RECALL)   DR Gala Romney OVER DUE ( RECALL)   1 year(s)  Provider:   You may see Lanier Prude, MD or one of the following Advanced Practice Providers on your designated Care Team:   Francis Dowse, [place] Casimiro Needle "Mardelle Matte" Lanna Poche, [place] Other Instructions

## 2022-10-18 ENCOUNTER — Other Ambulatory Visit (HOSPITAL_COMMUNITY): Payer: Self-pay | Admitting: Internal Medicine

## 2022-11-14 ENCOUNTER — Other Ambulatory Visit (HOSPITAL_COMMUNITY): Payer: Self-pay | Admitting: Cardiology

## 2022-11-14 DIAGNOSIS — I4819 Other persistent atrial fibrillation: Secondary | ICD-10-CM

## 2022-11-15 ENCOUNTER — Ambulatory Visit (HOSPITAL_BASED_OUTPATIENT_CLINIC_OR_DEPARTMENT_OTHER)
Admission: RE | Admit: 2022-11-15 | Discharge: 2022-11-15 | Disposition: A | Discharge status: Home / Self Care | Payer: Medicare Other | Source: Ambulatory Visit | Attending: Adult Health | Admitting: Adult Health

## 2022-11-15 ENCOUNTER — Encounter (HOSPITAL_COMMUNITY): Payer: Self-pay

## 2022-11-15 ENCOUNTER — Ambulatory Visit (HOSPITAL_COMMUNITY)
Admission: RE | Admit: 2022-11-15 | Discharge: 2022-11-15 | Disposition: A | Discharge status: Home / Self Care | Payer: Medicare Other | Source: Ambulatory Visit | Attending: Internal Medicine | Admitting: Internal Medicine

## 2022-11-15 VITALS — BP 128/72 | HR 54 | Wt 210.4 lb

## 2022-11-15 DIAGNOSIS — I2510 Atherosclerotic heart disease of native coronary artery without angina pectoris: Secondary | ICD-10-CM | POA: Diagnosis not present

## 2022-11-15 DIAGNOSIS — I10 Essential (primary) hypertension: Secondary | ICD-10-CM

## 2022-11-15 DIAGNOSIS — Z952 Presence of prosthetic heart valve: Secondary | ICD-10-CM

## 2022-11-15 DIAGNOSIS — G4733 Obstructive sleep apnea (adult) (pediatric): Secondary | ICD-10-CM | POA: Diagnosis not present

## 2022-11-15 DIAGNOSIS — Q2381 Bicuspid aortic valve: Secondary | ICD-10-CM | POA: Diagnosis not present

## 2022-11-15 DIAGNOSIS — Z7984 Long term (current) use of oral hypoglycemic drugs: Secondary | ICD-10-CM | POA: Insufficient documentation

## 2022-11-15 DIAGNOSIS — I4819 Other persistent atrial fibrillation: Secondary | ICD-10-CM | POA: Diagnosis not present

## 2022-11-15 DIAGNOSIS — E119 Type 2 diabetes mellitus without complications: Secondary | ICD-10-CM | POA: Diagnosis not present

## 2022-11-15 DIAGNOSIS — I3139 Other pericardial effusion (noninflammatory): Secondary | ICD-10-CM | POA: Diagnosis not present

## 2022-11-15 DIAGNOSIS — I509 Heart failure, unspecified: Secondary | ICD-10-CM | POA: Diagnosis not present

## 2022-11-15 DIAGNOSIS — Z79899 Other long term (current) drug therapy: Secondary | ICD-10-CM | POA: Insufficient documentation

## 2022-11-15 DIAGNOSIS — Z953 Presence of xenogenic heart valve: Secondary | ICD-10-CM | POA: Insufficient documentation

## 2022-11-15 DIAGNOSIS — I4891 Unspecified atrial fibrillation: Secondary | ICD-10-CM | POA: Diagnosis not present

## 2022-11-15 DIAGNOSIS — I480 Paroxysmal atrial fibrillation: Secondary | ICD-10-CM | POA: Diagnosis present

## 2022-11-15 DIAGNOSIS — I110 Hypertensive heart disease with heart failure: Secondary | ICD-10-CM | POA: Diagnosis not present

## 2022-11-15 DIAGNOSIS — Z7901 Long term (current) use of anticoagulants: Secondary | ICD-10-CM | POA: Insufficient documentation

## 2022-11-15 NOTE — Progress Notes (Signed)
  Echocardiogram 2D Echocardiogram has been performed.  Clifford Alvarez 11/15/2022, 8:58 AM

## 2022-11-15 NOTE — Patient Instructions (Signed)
Thank you for coming in today  If you had labs drawn today, any labs that are abnormal the clinic will call you No news is good news  Medications: No changes   Follow up appointments:  Your physician recommends that you schedule a follow-up appointment in:  1 year with Bensimhon and echocardiogram  Your physician has requested that you have an echocardiogram. Echocardiography is a painless test that uses sound waves to create images of your heart. It provides your doctor with information about the size and shape of your heart and how well your heart's chambers and valves are working. This procedure takes approximately one hour. There are no restrictions for this procedure.      Do the following things EVERYDAY: Weigh yourself in the morning before breakfast. Write it down and keep it in a log. Take your medicines as prescribed Eat low salt foods--Limit salt (sodium) to 2000 mg per day.  Stay as active as you can everyday Limit all fluids for the day to less than 2 liters   At the [facility], you and your health needs are our priority. As part of our continuing mission to provide you with exceptional heart care, we have created designated Provider Care Teams. These Care Teams include your primary Cardiologist (physician) and Advanced Practice Providers (APPs- Physician Assistants and Nurse Practitioners) who all work together to provide you with the care you need, when you need it.   You may see any of the following providers on your designated Care Team at your next follow up: Dr Arvilla Meres Dr Marca Ancona Dr. Marcos Eke, NP Robbie Lis, [facility] [place], [place] Brynda Peon, NP Karle Plumber, PharmD   Please be sure to bring in all your medications bottles to every appointment.    Thank you for choosing  [facility]  If you have any questions or concerns before your next  appointment please send [place] a message through [place] or call our office at [phone].    TO LEAVE A MESSAGE FOR THE NURSE SELECT OPTION 2, PLEASE LEAVE A MESSAGE INCLUDING: YOUR NAME DATE OF BIRTH CALL BACK NUMBER REASON FOR CALL**this is important as we prioritize the call backs  YOU WILL RECEIVE A CALL BACK THE SAME DAY AS LONG AS YOU CALL BEFORE 4:00 PM

## 2022-11-15 NOTE — Progress Notes (Signed)
Advanced Heart Failure Clinic Note    PCP: Plotnikov, Georgina Quint, MD EP: Dr. Lalla Brothers HF Cardiologist: Dr. Gala Romney   HPI: Clifford Alvarez is a 67 y.o. male with h/o HTN, DM2 with bicuspid aortic valve with dilated aortic root. Underwent Bentall aortic root replacement with bioprosthetic AVR on 01/10/19 at the [facility].  Pre-op cardiac CTA. No significant CAD Calcium score 11  Post-op course relatively uncomplicated. Had moderate pericardial effusion on post-op echo and started on colchicine. Resolved on f/u echo.   Echo 03/04/20 EF 60-65%. Normal diastolic function. RV ok. Bioprosthetic AVR ok mean gradient .   Had routine f/u echo in 3/23 EF read as 35-40% (Dr. Gala Romney thought 45-50%) in setting of new AF/AFL. Underwent DC-CV on 04/08/21.   Sleep study 5/23 with severe complex SDB OSA  44 CSA 24  In 5/23 underwent anterior cervical discectomies and fusion at C5-6 and C6-7 for progressive cervical stenosis and radiculopathy   Underwent AF ablation with Dr. Lalla Brothers 07/01/21   Follow up 10/23, doing well. BP low, losartan decreased to 25. Wearing CPAP  Today he returns for yearly follow up. Overall feeling fine. He is not SOB with activity. Retired from Education officer, environmental. Denies palpitations, syncope, CP, dizziness, edema, or PND/Orthopnea. Appetite ok. No fever or chills. Weight at home 207 pounds. Taking all medications. Not wearing CPAP  Echo today 11/15/22, reviewed images with Dr. Wayna Chalet, EF stable ~ 50%, bioprosthetic AV looks ok.   Past Medical History:  Diagnosis Date   Ascending aortic aneurysm (HCC)    5.4cm ascending aorta by Chest CT 12/2018, s/p thoracic aortic aneurysm repair with Biobentall 25mm Perimount Valve, 30mm graft   Bicuspid aortic valve    no AS or AI on echo 12/2018   CAD (coronary artery disease), native coronary artery    minimal CAD of the pLAD and diagonal with 0-24% stenosis   CHF (congestive heart failure) (HCC)    Coronary artery calcification seen on  CAT scan    calcium score 11 12/2018   Diabetes mellitus without complication (HCC)    Dysrhythmia    Glaucoma    Heart murmur    History of childhood heart murmur (Aortic)   Current Outpatient Medications  Medication Sig Dispense Refill   albuterol (VENTOLIN HFA) 108 (90 Base) MCG/ACT inhaler Inhale 2 puffs into the lungs every 6 (six) hours as needed for wheezing or shortness of breath. 8 g 0   Alpha-Lipoic Acid 300 MG TABS Take 600 mg by mouth in the morning and at bedtime.     apixaban (ELIQUIS) 5 MG TABS tablet TAKE 1 TABLET BY MOUTH TWICE A DAY 60 tablet 11   atorvastatin (LIPITOR) 40 MG tablet TAKE 1 TABLET BY MOUTH DAILY AT 6 PM. 90 tablet 3   B Complex-C (SUPER B COMPLEX/VITAMIN C) TABS Take 1 tablet by mouth daily.     Cholecalciferol (VITAMIN D3) 50 MCG (2000 UT) capsule TAKE 1 CAPSULE BY MOUTH EVERY DAY 90 capsule 3   dapagliflozin propanediol (FARXIGA) 10 MG TABS tablet Take 1 tablet (10 mg total) by mouth daily before breakfast. 90 tablet 3   dorzolamide-timolol (COSOPT) 22.3-6.8 MG/ML ophthalmic solution Place 1 drop into both eyes 2 (two) times daily.     glucose blood (ONETOUCH VERIO) test strip USE AS INSTRUCTED TO CHECK BLOOD SUGAR 2 TIMES A DAY E11.42 100 strip 3   Lancets (ONETOUCH DELICA PLUS LANCET33G) MISC USE TO TEST BLOOD SUGAR 3 TIMES DAILY AS INSTRUCTED. 100 each 4   losartan (COZAAR)  25 MG tablet TAKE 1 TABLET (25 MG TOTAL) BY MOUTH DAILY. 90 tablet 3   metFORMIN (GLUCOPHAGE) 1000 MG tablet TAKE 1 TABLET BY MOUTH TWICE A DAY WITH FOOD 180 tablet 3   metoprolol succinate (TOPROL-XL) 25 MG 24 hr tablet TAKE 1 TABLET (25 MG TOTAL) BY MOUTH DAILY. 90 tablet 0   No current facility-administered medications for this encounter.   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  Tobacco Use   Smoking status: Never   Smokeless tobacco: Never   Tobacco comments:    Never smoke 07/22/21  Vaping Use   Vaping status: Never Used  Substance and Sexual Activity   Alcohol use: No   Drug use: No   Sexual activity: Not on file  Other Topics Concern   Not on file  Social History Narrative   Married 38 years   Wife - Barbarann Ehlers 28   Daughter 77   Social Determinants of Health   Financial Resource Strain: Low Risk  (12/22/2021)   Overall Financial Resource Strain (CARDIA)    Difficulty of Paying Living Expenses: Not hard at all  Food Insecurity: No Food Insecurity (12/22/2021)   Hunger Vital Sign    Worried About Running Out of Food in the Last Year: Never true    Ran Out of Food in the Last Year: Never true  Transportation Needs: No Transportation Needs (12/22/2021)   PRAPARE - Administrator, Civil Service (Medical): No    Lack of Transportation (Non-Medical): No  Physical Activity: Sufficiently Active (12/22/2021)   Exercise Vital Sign    Days of Exercise per Week: 5 days    Minutes of Exercise per Session: 30 min  Stress: No Stress Concern Present (12/22/2021)   [employer] of Occupational Health - Occupational Stress Questionnaire    Feeling of Stress : Not at all  Social Connections: Socially Integrated (12/22/2021)   Social Connection and Isolation Panel [NHANES]    Frequency of Communication with Friends and Family: More than three times a week    Frequency of Social Gatherings with Friends and Family: More than three times a week    Attends Religious Services: More than 4 times per year    Active Member of [employer] or Organizations: Yes    Attends Engineer, structural: More than 4 times per year    Marital Status: Married  Catering manager Violence: Not At Risk (12/22/2021)   Humiliation, Afraid, Rape,  and Kick questionnaire    Fear of Current or Ex-Partner: No    Emotionally Abused: No    Physically Abused: No    Sexually Abused: No   Family History  Problem Relation Age of Onset   Diabetes Mother    Hypertension Mother    Diabetes Father    Heart disease Father    Diabetes Brother    Diabetes Sister    BP 128/72   Pulse (!) 54   Wt 95.4 kg (210 lb 6.4 oz)   SpO2 98%   BMI 24.95 kg/m   Wt Readings from Last 3 Encounters:  11/15/22 95.4 kg (210  lb 6.4 oz)  10/09/22 95.8 kg (211 lb 3.2 oz)  07/03/22 92.9 kg (204 lb 12.8 oz)   PHYSICAL EXAM: General:  NAD. No resp difficulty, walked into clinic HEENT: Normal Neck: Supple. No JVD. Carotids 2+ bilat; no bruits. No lymphadenopathy or thryomegaly appreciated. Cor: PMI nondisplaced. Regular rate & rhythm. No rubs, gallops or murmurs. Lungs: Clear Abdomen: Soft, nontender, nondistended. No hepatosplenomegaly. No bruits or masses. Good bowel sounds. Extremities: No cyanosis, clubbing, rash, edema Neuro: Alert & oriented x 3, cranial nerves grossly intact. Moves all 4 extremities w/o difficulty. Affect pleasant.  ECG (personally reviewed): SB 53 bpm  ASSESSMENT & PLAN:  1. Paroxysmal AFL/AF - asymptomatic. unknown duration.  - Echo 03/30/21 read as EF 35-40% but Dr. Gala Romney felt more like 45-50% with EF hard to assess due to rapid AFL - CHADVASc  =  2 - Continue Eliquis & Toprol - Zio 4/23 100% AF, mean VR = 73 - s/p TEE/ DC-CV on 04/08/21. EF 45-50%  - Sleep study 5/23 with severe complex SDB OSA  44 CSA 24 - s/p AF ablation with Dr. Lalla Brothers 07/01/21  - NSR on ECG today.  2. Bicuspid aortic valve with dilated aortic root.  - s/p Bentall aortic root replacement with bioprosthetic AVR on 01/10/19 at the [facility] - Echo 03/04/20 EF 60-65. Normal diastolic function. RV ok. Bioprosthetic AVR ok mean gradient .  - Echo 03/30/21 AoV ok  - Aware of need for SBE prophylaxis. Using regularly - Off ASA 81 with Eliquis -  Echo today 11/15/22, AoV appears stable; reviewed with Dr. Gasper Lloyd  3. HTN - Blood pressure well-controlled - Continue current meds - Labs reviewed and are stable, K 4.7, SCr 1.1  4. Type 2 DM - Controlled  - HgBA1c 5.2, followed by Dr. Elvera Lennox - On Farxiga and atorva  5. Severe sleep apnea - Dr. Gala Romney discussed mechanisms of SDB discussed at length. Suspect this is strongly related to his AF and will need to be addressed to reduce risk of recurrence after ablation - Sleep study 5/23 with severe complex SDB OSA 44, CSA 24 - No longer using CPAP  Follow up in 1 year with Dr. Gala Romney + echo  Jacklynn Ganong, [place] 11/15/22

## 2022-11-17 LAB — ECHOCARDIOGRAM COMPLETE
AR max vel: 2.86 cm2
AV Area VTI: 2.98 cm2
AV Area mean vel: 3.03 cm2
AV Mean grad: 12.5 mm[Hg]
AV Peak grad: 20.4 mm[Hg]
Ao pk vel: 2.26 m/s
Area-P 1/2: 3.37 cm2
Calc EF: 52.8 %
MV M vel: 2.55 m/s
MV Peak grad: 25.9 mm[Hg]
S' Lateral: 3.6 cm
Single Plane A2C EF: 51.8 %
Single Plane A4C EF: 54.5 %

## 2022-12-02 ENCOUNTER — Other Ambulatory Visit: Payer: Self-pay | Admitting: Internal Medicine

## 2022-12-02 DIAGNOSIS — E1142 Type 2 diabetes mellitus with diabetic polyneuropathy: Secondary | ICD-10-CM

## 2022-12-21 ENCOUNTER — Other Ambulatory Visit: Payer: Self-pay | Admitting: Internal Medicine

## 2022-12-26 ENCOUNTER — Other Ambulatory Visit: Payer: Self-pay | Admitting: Internal Medicine

## 2023-01-08 ENCOUNTER — Ambulatory Visit: Payer: BLUE CROSS/BLUE SHIELD | Admitting: Internal Medicine

## 2023-01-08 NOTE — Progress Notes (Signed)
 Patient ID: Clifford Alvarez, male   DOB: [birth year], 68 y.o.   MRN: [id number]  HPI: Clifford Alvarez is a 68 y.o.-year-old male, returning for f/u for DM2 dx 01/28/2013, previously insulin -dependent, now off insulin , controlled, with complications (CAD, Peripheral neuropathy). Last visit was 6.5 months ago.  Interim history: No increased urination, blurry vision, nausea, chest pain. He had a URI last mo >> still having cough. Sugars higher - cough syrups, cough drops.  Reviewed HbA1c levels: Lab Results  Component Value Date   HGBA1C 6.2 06/28/2022   HGBA1C 5.7 (A) 12/12/2021   HGBA1C 6.1 (H) 05/12/2021   HGBA1C 6.0 (A) 12/13/2020   HGBA1C 5.9 (A) 06/01/2020   HGBA1C 5.9 (A) 12/08/2019   HGBA1C 6.4 07/09/2019   HGBA1C 6.4 (A) 06/30/2019   HGBA1C 6.0 (A) 02/12/2019   HGBA1C 6.0 (A) 11/11/2018   HGBA1C 6.1 (A) 05/20/2018   HGBA1C 5.8 (A) 11/19/2017   HGBA1C 6.0 05/14/2017   HGBA1C 6 01/08/2017   HGBA1C 6.3 10/26/2016   HGBA1C 5.7 08/07/2016   HGBA1C 6.0 04/17/2016   HGBA1C 6.0 12/13/2015   HGBA1C 5.9 08/12/2015   HGBA1C 5.9 04/13/2015   He is on: - Metformin  1000 mg 2x a day with meals >> 2000 mg at dinnertime - Farxiga  5 (-10) mg daily  He was previously on Basaglar  12 units at bedtime >> stopped 08/2016 >> restarted: 6-8 units 3x a day >> now off completely. He was previously on Jardiance  and Invokana .  Pt checks his sugars once a day: - am: 100-140, usually 120s >> 120, 135-145, 150, 175 >> 115-135, 180 - 2h after b'fast: 146 >> 197 >> n/c  - before lunch:  102, 128 >> n/c - 2h after lunch: 1n/c >> 126 >> n/c - before dinner: n/c >> n/c >> 124-142 >> n/c >> 115-135 - 2h after dinner:  110-140s >> 130-140 >> n/c - bedtime:  120-174 >> n/c - nighttime:  123-145, 170 >> n/c Lowest sugar was 90 >> 100 >> 112 >> 115.  He  has hypoglycemia awareness in the 70s. Highest sugar was 140 >> 175 >> 180.  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  Pt's meals are: -  Breakfast: bagel, oatmeal, granola, eggs - Lunch: meat + 2-3 vegetables; soup; salad; sandwich - Dinner: meat + 2-3 vegetables; pasta; mexican; seafood - Snacks: 1 a day >> nuts, popcorn, dark chocolate  -No CKD, last BUN/creatinine:  Lab Results  Component Value Date   BUN 20 06/28/2022   CREATININE 1.10 06/28/2022   Lab Results  Component Value Date   GFRNONAA >60 05/12/2021   GFRNONAA >60 03/31/2021   GFRNONAA >60 11/05/2019   GFRNONAA >60 01/24/2019   GFRNONAA >89 04/13/2015   ACRs: Lab Results  Component Value Date   MICRALBCREAT 1.3 06/28/2022   MICRALBCREAT 0.5 07/09/2019   MICRALBCREAT 0.6 11/04/2018   MICRALBCREAT 1.6 10/29/2017   MICRALBCREAT 0.8 10/26/2016   MICRALBCREAT 0.6 04/13/2015   MICRALBCREAT 0.7 05/11/2014   MICRALBCREAT 0.3 03/12/2013  On losartan .  -+ HL; reviewed latest lipid panel: Lab Results  Component Value Date   CHOL 111 06/28/2022   HDL 30.20 (L) 06/28/2022   LDLCALC 54 06/06/2021   LDLDIRECT 57.0 06/28/2022   TRIG 201.0 (H) 06/28/2022   CHOLHDL 4 06/28/2022  On Lipitor 40.  - last eye exam was on 05/05/2022: No DR, + primary open-angle glaucoma OU at [facility]. Dr. [place].  He is seen every 4 mo - has frequent VF tests.  He had cataract surgeries.  - He has numbness but no tingling in his feet.  Last foot exam 12/12/2021.  He is status post ascending aortic aneurysm repair at [facility] clinic in 01/2018. He had cardioversion and ablation in 2023. He has persistent A-fib, CAD, severe OSA. He had discectomy 05/13/2021. Pain and numbness in L arm resolved.  ROS: + see HPI  I reviewed pt's medications, allergies, PMH, social hx, family hx, and changes were documented in the history of present illness. Otherwise, unchanged from my initial visit note.  Past Medical History:  Diagnosis Date   Ascending aortic aneurysm (HCC)    5.4cm ascending aorta by Chest CT 12/2018, s/p thoracic aortic aneurysm repair with Biobentall 25mm Perimount  Valve, 30mm graft   Bicuspid aortic valve    no AS or AI on echo 12/2018   CAD (coronary artery disease), native coronary artery    minimal CAD of the pLAD and diagonal with 0-24% stenosis   CHF (congestive heart failure) (HCC)    Coronary artery calcification seen on CAT scan    calcium  score 11 12/2018   Diabetes mellitus without complication (HCC)    Dysrhythmia    Glaucoma    Heart murmur    History of childhood heart murmur (Aortic)   Past Surgical History:  Procedure Laterality Date   ANTERIOR CERVICAL DECOMP/DISCECTOMY FUSION N/A 05/13/2021   Procedure: Anterior Cervical Discetomy Fusion - Cervical five-Cervical six - Cervical six-Cervical seven;  Surgeon: Onetha Kuba, MD;  Location: [facility] OR;  Service: Neurosurgery;  Laterality: N/A;   ATRIAL FIBRILLATION ABLATION N/A 07/01/2021   Procedure: ATRIAL FIBRILLATION ABLATION;  Surgeon: Cindie Ole DASEN, MD;  Location: [facility] INVASIVE CV LAB;  Service: Cardiovascular;  Laterality: N/A;   BENTALL PROCEDURE  01/10/2019   at [facility]   CARDIOVERSION N/A 04/08/2021   Procedure: CARDIOVERSION;  Surgeon: Cherrie Toribio SAUNDERS, MD;  Location: [facility] ENDOSCOPY;  Service: Cardiovascular;  Laterality: N/A;   COLONOSCOPY  2006   Dr. 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  Tobacco Use   Smoking status: Never   Smokeless tobacco: Never   Tobacco comments:    Never smoke 07/22/21  Vaping Use   Vaping status: Never Used  Substance and Sexual  Activity   Alcohol use: No   Drug use: No   Sexual activity: Not on file  Other Topics Concern   Not on file  Social History Narrative   Married 38 years   Wife - Marval Blades 28   Daughter 46   Social Drivers of Corporate Investment Banker Strain: Low Risk  (01/04/2023)   Overall Financial Resource Strain (CARDIA)    Difficulty of Paying Living Expenses: Not hard at all  Food Insecurity: No Food Insecurity (01/04/2023)   Hunger Vital Sign    Worried About Running Out of Food in the Last Year: Never true    Ran Out of Food in the Last Year: Never  true  Transportation Needs: No Transportation Needs (01/04/2023)   PRAPARE - Administrator, Civil Service (Medical): No    Lack of Transportation (Non-Medical): No  Physical Activity: Insufficiently Active (01/04/2023)   Exercise Vital Sign    Days of Exercise per Week: 3 days    Minutes of Exercise per Session: 40 min  Stress: No Stress Concern Present (01/04/2023)   [employer] of Occupational Health - Occupational Stress Questionnaire    Feeling of Stress : Not at all  Social Connections: Moderately Integrated (01/04/2023)   Social Connection and Isolation Panel [NHANES]    Frequency of Communication with Friends and Family: More than three times a week    Frequency of Social Gatherings with Friends and Family: More than three times a week    Attends Religious Services: Never    Database Administrator or Organizations: Yes    Attends Engineer, Structural: More than 4 times per year    Marital Status: Married  Catering Manager Violence: Not At Risk (12/22/2021)   Humiliation, Afraid, Rape, and Kick questionnaire    Fear of Current or Ex-Partner: No    Emotionally Abused: No    Physically Abused: No    Sexually Abused: No   Current Outpatient Medications on File Prior to Visit  Medication Sig Dispense Refill   albuterol  (VENTOLIN  HFA) 108 (90 Base) MCG/ACT inhaler Inhale 2 puffs into the lungs every 6 (six)  hours as needed for wheezing or shortness of breath. 8 g 0   Alpha-Lipoic Acid 300 MG TABS Take 600 mg by mouth in the morning and at bedtime.     apixaban  (ELIQUIS ) 5 MG TABS tablet TAKE 1 TABLET BY MOUTH TWICE A DAY 60 tablet 11   atorvastatin  (LIPITOR) 40 MG tablet TAKE 1 TABLET BY MOUTH DAILY AT 6 PM. 90 tablet 3   B Complex-C (SUPER B COMPLEX/VITAMIN C) TABS Take 1 tablet by mouth daily.     Cholecalciferol (VITAMIN D3) 50 MCG (2000 UT) capsule TAKE 1 CAPSULE BY MOUTH EVERY DAY 90 capsule 3   dorzolamide -timolol  (COSOPT ) 22.3-6.8 MG/ML ophthalmic solution Place 1 drop into both eyes 2 (two) times daily.     FARXIGA  10 MG TABS tablet TAKE 1 TABLET BY MOUTH DAILY BEFORE BREAKFAST. 90 tablet 3   glucose blood (ONETOUCH VERIO) test strip USE AS INSTRUCTED TO CHECK BLOOD SUGAR 2 TIMES A DAY E11.42 100 strip 3   Lancets (ONETOUCH DELICA PLUS LANCET33G) MISC USE TO TEST BLOOD SUGAR 3 TIMES DAILY AS INSTRUCTED. 100 each 4   losartan  (COZAAR ) 25 MG tablet TAKE 1 TABLET (25 MG TOTAL) BY MOUTH DAILY. 90 tablet 3   metFORMIN  (GLUCOPHAGE ) 1000 MG tablet TAKE 1 TABLET BY MOUTH TWICE A DAY WITH FOOD 180 tablet 3   metoprolol  succinate (TOPROL -XL) 25 MG 24 hr tablet TAKE 1 TABLET (25 MG TOTAL) BY MOUTH DAILY. 90 tablet 0   No current facility-administered medications on file prior to visit.   Allergies  Allergen Reactions   Penicillins     Childhood reaction    Family History  Problem Relation Age of Onset   Diabetes Mother    Hypertension Mother    Diabetes Father    Heart disease Father    Diabetes Brother    Diabetes Sister    PE: BP 124/60   Pulse 61   Ht 6' 5 (1.956 m)   Wt 210 lb 3.2 oz (95.3 kg)   SpO2 97%   BMI  24.93 kg/m    Wt Readings from Last 3 Encounters:  01/09/23 210 lb 3.2 oz (95.3 kg)  11/15/22 210 lb 6.4 oz (95.4 kg)  10/09/22 211 lb 3.2 oz (95.8 kg)   Constitutional: Slightly overweight, in NAD Eyes: EOMI, no exophthalmos ENT: no thyromegaly, no cervical  lymphadenopathy Cardiovascular: RRR, No MRG Respiratory: CTA B Musculoskeletal: no deformities Skin:no rashes Neurological: no tremor with outstretched hands Diabetic Foot Exam - Simple   Simple Foot Form Diabetic Foot exam was performed with the following findings: Yes 01/09/2023  8:14 AM  Visual Inspection No deformities, no ulcerations, no other skin breakdown bilaterally: Yes Sensation Testing Intact to touch and monofilament testing bilaterally: Yes Pulse Check Posterior Tibialis and Dorsalis pulse intact bilaterally: Yes Comments + B onychodystrophy + Dry skin + hammer toes B      ASSESSMENT: 1. DM2, insulin -independent, controlled, with complications  - CAD - PN -stable  2. PN 2/2 diabetes  3. HL  PLAN:  1. Patient with well-controlled type 2 diabetes, on oral antidiabetic regimen with metformin  and SGLT2 inhibitor.  At last visit, his HbA1c was slightly higher, at 6.2%.  At that time, he noticed that his sugars were higher after an upper respiratory infection 3 to 4 months prior.  They were slightly higher in the morning but he was still not checking sugars later in the day.  We again discussed the diet why this was important at least for the 2 weeks prior to our next visit.  I did advise him at that time to try to move the entire dose of metformin  at night to improve his morning sugars.  We also discussed about increasing Farxiga  from 5 mg to 10 mg if moving the metformin  would not be enough. -At today's visit, his sugars are at goal, Except for the last month but they have been higher due to upper respiratory infection and taking cough drops and syrups. We discussed about taking diabetic syrups if needed. He has been feeling better lately and decrease the amount of over-the-counter supplements and he feels that his sugars improved.  Indeed, his HbA1c has not increased, but appears stable compared to last visit.  I did not recommend a change in regimen for now.  He does feel  that his sugars improved in the morning after starting to take the whole metformin  dose at night. - I suggested to: Patient Instructions  Please continue: - Metformin  2000 mg at dinnertime - Farxiga  5-10 mg daily before breakfast    Please return in 6 months with your sugar log.   - we checked his HbA1c: 6.2% (stable) - advised to check sugars at different times of the day - 1x a day, rotating check times - advised for yearly eye exams >> he is UTD - on the last foot exam he had desquamative rash on bilateral soles and between toes.  He also has onychodystrophy.  We discussed at that time about possible options for treatment.  He tried Vicks vapor rub in the past, but he did not feel that this worked well for him.  I did suggest he see podiatry.  He did not establish care yet. - return to clinic in 6 months  2. PN 2/2 DM  -He continues to have numbness in his feet but no pain -He is on alpha lipoic acid and B complex, which are both helping  3. HL -Latest lipid panel was on 06/28/2022: LDL at goal, HDL slightly low, triglycerides high: Lab Results  Component  Value Date   CHOL 111 06/28/2022   HDL 30.20 (L) 06/28/2022   LDLCALC 54 06/06/2021   LDLDIRECT 57.0 06/28/2022   TRIG 201.0 (H) 06/28/2022   CHOLHDL 4 06/28/2022  -He is on Lipitor 40 mg daily without side effects  Lela Fendt, MD PhD [facility] Endocrinology

## 2023-01-09 ENCOUNTER — Encounter: Payer: Self-pay | Admitting: Internal Medicine

## 2023-01-09 ENCOUNTER — Ambulatory Visit: Payer: Medicare Other | Admitting: Internal Medicine

## 2023-01-09 VITALS — BP 124/60 | HR 61 | Ht 77.0 in | Wt 210.2 lb

## 2023-01-09 DIAGNOSIS — E1142 Type 2 diabetes mellitus with diabetic polyneuropathy: Secondary | ICD-10-CM | POA: Diagnosis not present

## 2023-01-09 DIAGNOSIS — E7800 Pure hypercholesterolemia, unspecified: Secondary | ICD-10-CM

## 2023-01-09 DIAGNOSIS — Z7984 Long term (current) use of oral hypoglycemic drugs: Secondary | ICD-10-CM | POA: Diagnosis not present

## 2023-01-09 DIAGNOSIS — E1159 Type 2 diabetes mellitus with other circulatory complications: Secondary | ICD-10-CM | POA: Diagnosis not present

## 2023-01-09 DIAGNOSIS — G63 Polyneuropathy in diseases classified elsewhere: Secondary | ICD-10-CM

## 2023-01-09 LAB — POCT GLYCOSYLATED HEMOGLOBIN (HGB A1C): Hemoglobin A1C: 6.2 % — AB (ref 4.0–5.6)

## 2023-01-09 NOTE — Patient Instructions (Signed)
 Please continue: - Metformin 2000 mg at dinnertime - Farxiga 5-10 mg daily before breakfast    Please return in 6 months with your sugar log.

## 2023-01-15 ENCOUNTER — Ambulatory Visit (INDEPENDENT_AMBULATORY_CARE_PROVIDER_SITE_OTHER): Payer: Medicare Other | Admitting: Internal Medicine

## 2023-01-15 ENCOUNTER — Encounter: Payer: Self-pay | Admitting: Internal Medicine

## 2023-01-15 VITALS — BP 112/70 | Temp 97.8°F | Ht 77.0 in | Wt 210.0 lb

## 2023-01-15 DIAGNOSIS — R053 Chronic cough: Secondary | ICD-10-CM

## 2023-01-15 DIAGNOSIS — I10 Essential (primary) hypertension: Secondary | ICD-10-CM | POA: Diagnosis not present

## 2023-01-15 DIAGNOSIS — E559 Vitamin D deficiency, unspecified: Secondary | ICD-10-CM

## 2023-01-15 DIAGNOSIS — I2583 Coronary atherosclerosis due to lipid rich plaque: Secondary | ICD-10-CM

## 2023-01-15 DIAGNOSIS — E1142 Type 2 diabetes mellitus with diabetic polyneuropathy: Secondary | ICD-10-CM | POA: Diagnosis not present

## 2023-01-15 DIAGNOSIS — Z1211 Encounter for screening for malignant neoplasm of colon: Secondary | ICD-10-CM

## 2023-01-15 DIAGNOSIS — E7800 Pure hypercholesterolemia, unspecified: Secondary | ICD-10-CM | POA: Diagnosis not present

## 2023-01-15 DIAGNOSIS — I2510 Atherosclerotic heart disease of native coronary artery without angina pectoris: Secondary | ICD-10-CM

## 2023-01-15 DIAGNOSIS — Z7984 Long term (current) use of oral hypoglycemic drugs: Secondary | ICD-10-CM

## 2023-01-15 LAB — COMPREHENSIVE METABOLIC PANEL
ALT: 21 U/L (ref 0–53)
AST: 20 U/L (ref 0–37)
Albumin: 4.4 g/dL (ref 3.5–5.2)
Alkaline Phosphatase: 89 U/L (ref 39–117)
BUN: 21 mg/dL (ref 6–23)
CO2: 30 meq/L (ref 19–32)
Calcium: 9.2 mg/dL (ref 8.4–10.5)
Chloride: 107 meq/L (ref 96–112)
Creatinine, Ser: 1.07 mg/dL (ref 0.40–1.50)
GFR: 71.18 mL/min (ref >60.00)
Glucose, Bld: 155 mg/dL — ABNORMAL HIGH (ref 70–99)
Potassium: 4.3 meq/L (ref 3.5–5.1)
Sodium: 142 meq/L (ref 135–145)
Total Bilirubin: 0.8 mg/dL (ref 0.2–1.2)
Total Protein: 6.9 g/dL (ref 6.0–8.3)

## 2023-01-15 LAB — TSH: TSH: 2.18 u[IU]/mL (ref 0.35–5.50)

## 2023-01-15 MED ORDER — ALBUTEROL SULFATE HFA 108 (90 BASE) MCG/ACT IN AERS: 2.0000 | INHALATION_SPRAY | Freq: Four times a day (QID) | RESPIRATORY_TRACT | 2 refills | Status: AC | PRN

## 2023-01-15 NOTE — Assessment & Plan Note (Signed)
 On Lipitor

## 2023-01-15 NOTE — Progress Notes (Signed)
 Subjective:  Patient ID: Clifford Alvarez, male    DOB: 1955-05-17  Age: 68 y.o. MRN: [id number]  CC: No chief complaint on file.   HPI Clifford Alvarez presents for a 6 mo f/u on DM, HTN, CAD, PAF Post URI cough - 90% better  Outpatient Medications Prior to Visit  Medication Sig Dispense Refill   Alpha-Lipoic Acid 300 MG TABS Take 600 mg by mouth in the morning and at bedtime.     apixaban  (ELIQUIS ) 5 MG TABS tablet TAKE 1 TABLET BY MOUTH TWICE A DAY 60 tablet 11   atorvastatin  (LIPITOR) 40 MG tablet TAKE 1 TABLET BY MOUTH DAILY AT 6 PM. 90 tablet 3   B Complex-C (SUPER B COMPLEX/VITAMIN C) TABS Take 1 tablet by mouth daily.     Cholecalciferol (VITAMIN D3) 50 MCG (2000 UT) capsule TAKE 1 CAPSULE BY MOUTH EVERY DAY 90 capsule 3   dorzolamide -timolol  (COSOPT ) 22.3-6.8 MG/ML ophthalmic solution Place 1 drop into both eyes 2 (two) times daily.     FARXIGA  10 MG TABS tablet TAKE 1 TABLET BY MOUTH DAILY BEFORE BREAKFAST. 90 tablet 3   glucose blood (ONETOUCH VERIO) test strip USE AS INSTRUCTED TO CHECK BLOOD SUGAR 2 TIMES A DAY E11.42 100 strip 3   Lancets (ONETOUCH DELICA PLUS LANCET33G) MISC USE TO TEST BLOOD SUGAR 3 TIMES DAILY AS INSTRUCTED. 100 each 4   losartan  (COZAAR ) 25 MG tablet TAKE 1 TABLET (25 MG TOTAL) BY MOUTH DAILY. 90 tablet 3   metFORMIN  (GLUCOPHAGE ) 1000 MG tablet TAKE 1 TABLET BY MOUTH TWICE A DAY WITH FOOD 180 tablet 3   metoprolol  succinate (TOPROL -XL) 25 MG 24 hr tablet TAKE 1 TABLET (25 MG TOTAL) BY MOUTH DAILY. 90 tablet 0   albuterol  (VENTOLIN  HFA) 108 (90 Base) MCG/ACT inhaler Inhale 2 puffs into the lungs every 6 (six) hours as needed for wheezing or shortness of breath. 8 g 0   No facility-administered medications prior to visit.    ROS: Review of Systems  Constitutional:  Negative for appetite change, fatigue and unexpected weight change.  HENT:  Negative for congestion, nosebleeds, sneezing, sore throat and trouble swallowing.   Eyes:  Negative for  itching and visual disturbance.  Respiratory:  Negative for cough.   Cardiovascular:  Negative for chest pain, palpitations and leg swelling.  Gastrointestinal:  Negative for abdominal distention, blood in stool, diarrhea and nausea.  Genitourinary:  Negative for frequency and hematuria.  Musculoskeletal:  Negative for back pain, gait problem, joint swelling and neck pain.  Skin:  Negative for rash.  Neurological:  Negative for dizziness, tremors, speech difficulty and weakness.  Psychiatric/Behavioral:  Negative for agitation, dysphoric mood and sleep disturbance. The patient is not nervous/anxious.     Objective:  BP 112/70 (BP Location: Right Arm, Patient Position: Sitting, Cuff Size: Normal)   Temp 97.8 F (36.6 C) (Oral)   Ht 6' 5 (1.956 m)   Wt 210 lb (95.3 kg)   BMI 24.90 kg/m   BP Readings from Last 3 Encounters:  01/15/23 112/70  01/09/23 124/60  11/15/22 128/72    Wt Readings from Last 3 Encounters:  01/15/23 210 lb (95.3 kg)  01/09/23 210 lb 3.2 oz (95.3 kg)  11/15/22 210 lb 6.4 oz (95.4 kg)    Physical Exam Constitutional:      General: He is not in acute distress.    Appearance: He is well-developed. He is not diaphoretic.     Comments: NAD  HENT:  Head: Normocephalic and atraumatic.     Right Ear: External ear normal.     Left Ear: External ear normal.     Nose: Nose normal.     Mouth/Throat:     Pharynx: No oropharyngeal exudate.  Eyes:     General: No scleral icterus.       Right eye: No discharge.        Left eye: No discharge.     Conjunctiva/sclera: Conjunctivae normal.     Pupils: Pupils are equal, round, and reactive to light.  Neck:     Thyroid : No thyromegaly.     Vascular: No JVD.     Trachea: No tracheal deviation.  Cardiovascular:     Rate and Rhythm: Normal rate and regular rhythm.     Heart sounds: Normal heart sounds. No murmur heard.    No friction rub. No gallop.  Pulmonary:     Effort: Pulmonary effort is normal. No  respiratory distress.     Breath sounds: Normal breath sounds. No stridor. No wheezing or rales.  Chest:     Chest wall: No tenderness.  Abdominal:     General: Bowel sounds are normal. There is no distension.     Palpations: Abdomen is soft. There is no mass.     Tenderness: There is no abdominal tenderness. There is no guarding or rebound.  Genitourinary:    Penis: Normal. No tenderness.      Prostate: Normal.     Rectum: Normal. Guaiac result negative.  Musculoskeletal:        General: No tenderness. Normal range of motion.     Cervical back: Normal range of motion and neck supple.  Lymphadenopathy:     Cervical: No cervical adenopathy.  Skin:    General: Skin is warm and dry.     Coloration: Skin is not pale.     Findings: No erythema or rash.  Neurological:     Mental Status: He is alert and oriented to person, place, and time.     Cranial Nerves: No cranial nerve deficit.     Motor: No abnormal muscle tone.     Coordination: Coordination normal.     Gait: Gait normal.     Deep Tendon Reflexes: Reflexes are normal and symmetric. Reflexes normal.  Psychiatric:        Behavior: Behavior normal.        Thought Content: Thought content normal.        Judgment: Judgment normal.     Lab Results  Component Value Date   WBC 5.9 06/28/2022   HGB 14.3 06/28/2022   HCT 44.5 06/28/2022   PLT 144.0 (L) 06/28/2022   GLUCOSE 152 (H) 06/28/2022   CHOL 111 06/28/2022   TRIG 201.0 (H) 06/28/2022   HDL 30.20 (L) 06/28/2022   LDLDIRECT 57.0 06/28/2022   LDLCALC 54 06/06/2021   ALT 17 06/28/2022   AST 19 06/28/2022   NA 139 06/28/2022   K 4.7 06/28/2022   CL 102 06/28/2022   CREATININE 1.10 06/28/2022   BUN 20 06/28/2022   CO2 30 06/28/2022   TSH 2.96 06/28/2022   PSA 1.15 06/28/2022   HGBA1C 6.2 (A) 01/09/2023   MICROALBUR <0.7 06/28/2022    ECHOCARDIOGRAM COMPLETE Result Date: 11/17/2022    ECHOCARDIOGRAM REPORT   Patient Name:   Clifford Alvarez Date of Exam:  11/15/2022 Medical Rec #:  [id number]            Height:  77.0 in Accession #:    [id number]           Weight:       211.2 lb Date of Birth:  07-02-1955             BSA:          2.289 m Patient Age:    67 years             BP:           143/74 mmHg Patient Gender: M                    HR:           63 bpm. Exam Location:  Outpatient Procedure: 2D Echo, 3D Echo, Cardiac Doppler, Color Doppler and Strain Analysis Indications:    Persistent atrial fibrillation  History:        Patient has prior history of Echocardiogram examinations, most                 recent 04/08/2021. CAD, Arrythmias:Atrial Fibrillation; Risk                 Factors:Hypertension, Diabetes and Sleep Apnea. Bicuspid AV s/p                 AVR/root replacement/[facility] 01/10/19.                 Aortic Valve: 25 mm Perimount bioprosthetic valve is present in                 the aortic position. Procedure Date: 01/10/2019.  Sonographer:    Clotilda Center Referring Phys: [id number] DANIEL R BENSIMHON  Sonographer Comments: Global longitudinal strain was attempted. IMPRESSIONS  1. Left ventricular ejection fraction, by estimation, is 50 to 55%. Left ventricular ejection fraction by 3D volume is 52 %. The left ventricle has low normal function. The left ventricle has no regional wall motion abnormalities. There is mild left ventricular hypertrophy. Left ventricular diastolic parameters are indeterminate.  2. Right ventricular systolic function is mildly reduced. The right ventricular size is normal. There is normal pulmonary artery systolic pressure.  3. The mitral valve is normal in structure. Trivial mitral valve regurgitation. No evidence of mitral stenosis.  4. The aortic valve has been repaired/replaced. Aortic valve regurgitation is not visualized. No aortic stenosis is present. There is a 25 mm Perimount bioprosthetic valve present in the aortic position. Procedure Date: 01/10/2019. Echo findings are consistent with normal structure and function of the  aortic valve prosthesis. Aortic valve mean gradient measures 12.5 mmHg.  5. Aortic root/ascending aorta has been repaired/replaced.  6. The inferior vena cava is normal in size with greater than 50% respiratory variability, suggesting right atrial pressure of 3 mmHg. FINDINGS  Left Ventricle: Left ventricular ejection fraction, by estimation, is 50 to 55%. Left ventricular ejection fraction by 3D volume is 52 %. The left ventricle has low normal function. The left ventricle has no regional wall motion abnormalities. Global longitudinal strain performed but not reported based on interpreter judgement due to suboptimal tracking. The left ventricular internal cavity size was normal in size. There is mild left ventricular hypertrophy. Left ventricular diastolic parameters are indeterminate. Right Ventricle: The right ventricular size is normal. No increase in right ventricular wall thickness. Right ventricular systolic function is mildly reduced. There is normal pulmonary artery systolic pressure. The tricuspid regurgitant velocity is 1.78 m/s, and with an assumed right atrial pressure of 3 mmHg, the  estimated right ventricular systolic pressure is 15.7 mmHg. Left Atrium: Left atrial size was normal in size. Right Atrium: Right atrial size was normal in size. Pericardium: There is no evidence of pericardial effusion. Mitral Valve: The mitral valve is normal in structure. Trivial mitral valve regurgitation. No evidence of mitral valve stenosis. Tricuspid Valve: The tricuspid valve is normal in structure. Tricuspid valve regurgitation is mild . No evidence of tricuspid stenosis. Aortic Valve: The aortic valve has been repaired/replaced. Aortic valve regurgitation is not visualized. No aortic stenosis is present. Aortic valve mean gradient measures 12.5 mmHg. Aortic valve peak gradient measures 20.4 mmHg. Aortic valve area, by VTI measures 2.98 cm. There is a 25 mm Perimount bioprosthetic valve present in the aortic  position. Procedure Date: 01/10/2019. Echo findings are consistent with normal structure and function of the aortic valve prosthesis. Pulmonic Valve: The pulmonic valve was normal in structure. Pulmonic valve regurgitation is mild. No evidence of pulmonic stenosis. Aorta: The aortic root/ascending aorta has been repaired/replaced. Venous: The inferior vena cava is normal in size with greater than 50% respiratory variability, suggesting right atrial pressure of 3 mmHg. IAS/Shunts: No atrial level shunt detected by color flow Doppler.  LEFT VENTRICLE PLAX 2D LVIDd:         4.50 cm         Diastology LVIDs:         3.60 cm         LV e' medial:    6.09 cm/s LV PW:         1.00 cm         LV E/e' medial:  10.4 LV IVS:        1.10 cm         LV e' lateral:   12.50 cm/s LVOT diam:     2.30 cm         LV E/e' lateral: 5.1 LV SV:         174 LV SV Index:   76 LVOT Area:     4.15 cm        3D Volume EF                                LV 3D EF:    Left                                             ventricul LV Volumes (MOD)                            ar LV vol d, MOD    125.0 ml                   ejection A2C:                                        fraction LV vol d, MOD    177.0 ml                   by 3D A4C:  volume is LV vol s, MOD    60.2 ml                    52 %. A2C: LV vol s, MOD    80.6 ml A4C:                           3D Volume EF: LV SV MOD A2C:   64.8 ml       3D EF:        52 % LV SV MOD A4C:   177.0 ml      LV EDV:       191 ml LV SV MOD BP:    80.6 ml       LV ESV:       92 ml                                LV SV:        99 ml RIGHT VENTRICLE            IVC RV Basal diam:  3.65 cm    IVC diam: 2.40 cm RV S prime:     8.49 cm/s TAPSE (M-mode): 1.7 cm LEFT ATRIUM             Index        RIGHT ATRIUM           Index LA diam:        5.30 cm 2.32 cm/m   RA Area:     17.30 cm LA Vol (A2C):   56.5 ml 24.68 ml/m  RA Volume:   41.40 ml  18.08 ml/m LA Vol (A4C):   55.8 ml 24.38  ml/m LA Biplane Vol: 58.0 ml 25.34 ml/m  AORTIC VALVE                     PULMONIC VALVE AV Area (Vmax):    2.86 cm      PR End Diast Vel: 6.05 msec AV Area (Vmean):   3.03 cm AV Area (VTI):     2.98 cm AV Vmax:           226.00 cm/s AV Vmean:          168.500 cm/s AV VTI:            0.586 m AV Peak Grad:      20.4 mmHg AV Mean Grad:      12.5 mmHg LVOT Vmax:         155.36 cm/s LVOT Vmean:        122.907 cm/s LVOT VTI:          0.420 m LVOT/AV VTI ratio: 0.72  AORTA Ao Root diam: 3.30 cm Ao Asc diam:  3.10 cm MITRAL VALVE               TRICUSPID VALVE MV Area (PHT): 3.37 cm    TR Peak grad:   12.7 mmHg MV Decel Time: 225 msec    TR Vmax:        178.00 cm/s MR Peak grad: 25.9 mmHg MR Vmax:      254.50 cm/s  SHUNTS MV E velocity: 63.20 cm/s  Systemic VTI:  0.42 m  Systemic Diam: 2.30 cm Soyla Merck MD Electronically signed by Soyla Merck MD Signature Date/Time: 11/17/2022/7:07:27 AM    Final     Assessment & Plan:   Problem List Items Addressed This Visit     Type 2 diabetes, controlled, with peripheral neuropathy (HCC) (Chronic)   Dr Trixie On Farxiga , Metformin , Losartan ,  Lipitor      Hyperlipidemia   Relevant Orders   TSH   Hypertension   On Losartan       Vitamin D  deficiency   On Vit D      Coronary atherosclerosis   On Lipitor      CAD (coronary artery disease), native coronary artery - Primary   On Eliquis , Lipitor No angina      Relevant Orders   Comprehensive metabolic panel   TSH   Other Visit Diagnoses       Screening for colon cancer       Relevant Orders   Cologuard         Meds ordered this encounter  Medications   albuterol  (VENTOLIN  HFA) 108 (90 Base) MCG/ACT inhaler    Sig: Inhale 2 puffs into the lungs every 6 (six) hours as needed for wheezing or shortness of breath.    Dispense:  8 g    Refill:  2      Follow-up: No follow-ups on file.  Marolyn Noel, MD

## 2023-01-15 NOTE — Assessment & Plan Note (Signed)
 Dr Elvera Lennox On Farxiga, Metformin, Losartan,  Lipitor

## 2023-01-15 NOTE — Assessment & Plan Note (Signed)
 Post URI 90% better Ventolin MDI prn - Rx

## 2023-01-15 NOTE — Assessment & Plan Note (Signed)
On Eliquis, Lipitor No angina

## 2023-01-15 NOTE — Assessment & Plan Note (Signed)
On Losartan 

## 2023-01-15 NOTE — Assessment & Plan Note (Signed)
 On Vit D

## 2023-02-21 LAB — COLOGUARD: COLOGUARD: NEGATIVE

## 2023-03-18 ENCOUNTER — Other Ambulatory Visit: Payer: Self-pay | Admitting: Internal Medicine

## 2023-03-26 ENCOUNTER — Other Ambulatory Visit (HOSPITAL_COMMUNITY): Payer: Self-pay | Admitting: Internal Medicine

## 2023-05-08 ENCOUNTER — Ambulatory Visit

## 2023-05-08 VITALS — Ht 77.0 in | Wt 210.0 lb

## 2023-05-08 DIAGNOSIS — Z0000 Encounter for general adult medical examination without abnormal findings: Secondary | ICD-10-CM

## 2023-05-08 NOTE — Progress Notes (Addendum)
 Subjective:   Clifford Alvarez is a 68 y.o. who presents for a Medicare Wellness preventive visit.  Visit Complete: Virtual I connected with  Standley Earing on 05/08/23 by a audio enabled telemedicine application and verified that I am speaking with the correct person using two identifiers.  Patient Location: Home  Provider Location: Home Office  I discussed the limitations of evaluation and management by telemedicine. The patient expressed understanding and agreed to proceed.  Vital Signs: Because this visit was a virtual/telehealth visit, some criteria may be missing or patient reported. Any vitals not documented were not able to be obtained and vitals that have been documented are patient reported.  VideoDeclined- This patient declined Librarian, academic. Therefore the visit was completed with audio only.  Persons Participating in Visit: Patient.  AWV Questionnaire: Yes: Patient Medicare AWV questionnaire was completed by the patient on 05/04/2023; I have confirmed that all information answered by patient is correct and no changes since this date.  Cardiac Risk Factors include: advanced age (>33men, >48 women);male gender;hypertension;diabetes mellitus;Other (see comment);dyslipidemia, Risk factor comments: CAD, OSA,  Coronary atherosclerosis     Objective:    Today's Vitals   05/08/23 0802  Weight: 210 lb (95.3 kg)  Height: 6\' 5"  (1.956 m)   Body mass index is 24.9 kg/m.     05/08/2023    8:25 AM 12/22/2021   10:46 AM 08/03/2021    8:22 PM 07/01/2021    6:12 AM 05/12/2021    3:19 PM 04/08/2021    6:59 AM 03/25/2013    8:16 AM  Advanced Directives  Does Patient Have a Medical Advance Directive? Yes Yes Yes Yes Yes Yes Patient would not like information  Type of Advance Directive Healthcare Power of [place];Living will Healthcare Power of [place];Living will Healthcare Power of [place];Living will Healthcare Power of [place];Living will  Healthcare Power of [place];Living will Healthcare Power of [place];Living will   Does patient want to make changes to medical advance directive? No - Patient declined No - Patient declined No - Patient declined No - Patient declined No - Patient declined    Copy of Healthcare Power of Attorney in Chart? Yes - validated most recent copy scanned in chart (See row information) Yes - validated most recent copy scanned in chart (See row information) No - copy requested No - copy requested  No - copy requested     Current Medications (verified) Outpatient Encounter Medications as of 05/08/2023  Medication Sig   albuterol  (VENTOLIN  HFA) 108 (90 Base) MCG/ACT inhaler Inhale 2 puffs into the lungs every 6 (six) hours as needed for wheezing or shortness of breath.   Alpha-Lipoic Acid 300 MG TABS Take 600 mg by mouth in the morning and at bedtime.   apixaban  (ELIQUIS ) 5 MG TABS tablet TAKE 1 TABLET BY MOUTH TWICE A DAY   atorvastatin  (LIPITOR) 40 MG tablet TAKE 1 TABLET BY MOUTH DAILY AT 6 PM.   B Complex-C (SUPER B COMPLEX/VITAMIN C) TABS Take 1 tablet by mouth daily.   Cholecalciferol (VITAMIN D3) 50 MCG (2000 UT) capsule TAKE 1 CAPSULE BY MOUTH EVERY DAY   dorzolamide -timolol  (COSOPT ) 22.3-6.8 MG/ML ophthalmic solution Place 1 drop into both eyes 2 (two) times daily.   FARXIGA  10 MG TABS tablet TAKE 1 TABLET BY MOUTH DAILY BEFORE BREAKFAST.   glucose blood (ONETOUCH VERIO) test strip USE AS INSTRUCTED TO CHECK BLOOD SUGAR 2 TIMES A DAY E11.42   Lancets (ONETOUCH DELICA PLUS LANCET33G) MISC USE TO  TEST BLOOD SUGAR 3 TIMES DAILY AS INSTRUCTED.   losartan  (COZAAR ) 25 MG tablet TAKE 1 TABLET (25 MG TOTAL) BY MOUTH DAILY.   metFORMIN  (GLUCOPHAGE ) 1000 MG tablet TAKE 1 TABLET BY MOUTH TWICE A DAY WITH FOOD   metoprolol  succinate (TOPROL -XL) 25 MG 24 hr tablet TAKE 1 TABLET (25 MG TOTAL) BY MOUTH DAILY.   No facility-administered encounter medications on file as of 05/08/2023.    Allergies  (verified) Penicillins   History: Past Medical History:  Diagnosis Date   Ascending aortic aneurysm (HCC)    5.4cm ascending aorta by Chest CT 12/2018, s/p thoracic aortic aneurysm repair with Biobentall 25mm Perimount Valve, 30mm graft   Bicuspid aortic valve    no AS or AI on echo 12/2018   CAD (coronary artery disease), native coronary artery    minimal CAD of the pLAD and diagonal with 0-24% stenosis   CHF (congestive heart failure) (HCC)    Coronary artery calcification seen on CAT scan    calcium  score 11 12/2018   Diabetes mellitus without complication (HCC)    Dysrhythmia    Glaucoma    Heart murmur    History of childhood heart murmur (Aortic)   Past Surgical History:  Procedure Laterality Date   ANTERIOR CERVICAL DECOMP/DISCECTOMY FUSION N/A 05/13/2021   Procedure: Anterior Cervical Discetomy Fusion - Cervical five-Cervical six - Cervical six-Cervical seven;  Surgeon: Gearl Keens, MD;  Location: [facility] OR;  Service: Neurosurgery;  Laterality: N/A;   ATRIAL FIBRILLATION ABLATION N/A 07/01/2021   Procedure: ATRIAL FIBRILLATION ABLATION;  Surgeon: Boyce Byes, MD;  Location: [facility] INVASIVE CV LAB;  Service: Cardiovascular;  Laterality: N/A;   BENTALL PROCEDURE  01/10/2019   at [facility]   CARDIOVERSION N/A 04/08/2021   Procedure: CARDIOVERSION;  Surgeon: Mardell Shade, MD;  Location: [facility] ENDOSCOPY;  Service: Cardiovascular;  Laterality: N/A;   COLONOSCOPY  2006   Dr. Dellis Fermo   EYE SURGERY     TEE WITHOUT CARDIOVERSION N/A 04/08/2021   Procedure: TRANSESOPHAGEAL ECHOCARDIOGRAM (TEE);  Surgeon: Mardell Shade, MD;  Location: [facility] ENDOSCOPY;  Service: Cardiovascular;  Laterality: N/A;   TONSILLECTOMY     Family History  Problem Relation Age of Onset   Diabetes Mother    Hypertension Mother    Diabetes Father    Heart disease Father    Diabetes Brother    Diabetes Sister    Social History   Socioeconomic History   Marital status: Married    Spouse  name: Debbie   Number of children: 2   Years of education: Not on file   Highest education level: Bachelor's degree (e.g., BA, AB, BS)  Occupational History   Occupation: Self employed - CEO    Comment: Highway maintenance  Tobacco Use   Smoking status: Never   Smokeless tobacco: Never   Tobacco comments:    Never smoke 07/22/21  Vaping Use   Vaping status: Never Used  Substance and Sexual Activity   Alcohol use: No   Drug use: No   Sexual activity: Not on file  Other Topics Concern   Not on file  Social History Narrative   Married 38 yearsWife - DebbieSon 28Daughter 64      Lives with wife/2025   Social Drivers of Health   Financial Resource Strain: Low Risk  (05/08/2023)   Overall Financial Resource Strain (CARDIA)    Difficulty of Paying Living Expenses: Not hard at all  Food Insecurity: No Food Insecurity (05/08/2023)   Hunger Vital Sign  Worried About Programme researcher, broadcasting/film/video in the Last Year: Never true    Ran Out of Food in the Last Year: Never true  Transportation Needs: No Transportation Needs (05/08/2023)   PRAPARE - Administrator, Civil Service (Medical): No    Lack of Transportation (Non-Medical): No  Physical Activity: Sufficiently Active (05/08/2023)   Exercise Vital Sign    Days of Exercise per Week: 4 days    Minutes of Exercise per Session: 60 min  Stress: No Stress Concern Present (05/08/2023)   [employer] of Occupational Health - Occupational Stress Questionnaire    Feeling of Stress : Not at all  Social Connections: Moderately Isolated (05/08/2023)   Social Connection and Isolation Panel [NHANES]    Frequency of Communication with Friends and Family: More than three times a week    Frequency of Social Gatherings with Friends and Family: More than three times a week    Attends Religious Services: Never    Database administrator or Organizations: No    Attends Banker Meetings: Never    Marital Status: Married    Tobacco  Counseling Counseling given: Not Answered Tobacco comments: Never smoke 07/22/21    Clinical Intake:  Pre-visit preparation completed: Yes  Pain : No/denies pain     BMI - recorded: 24.9 Nutritional Status: BMI of 19-24  Normal Nutritional Risks: None Diabetes: Yes CBG done?: Yes (125 per pt) CBG resulted in Enter/ Edit results?: No Did pt. bring in CBG monitor from home?: No  Lab Results  Component Value Date   HGBA1C 6.2 (A) 01/09/2023   HGBA1C 6.2 06/28/2022   HGBA1C 5.7 (A) 12/12/2021     How often do you need to have someone help you when you read instructions, pamphlets, or other written materials from your doctor or pharmacy?: 1 - Never  Interpreter Needed?: No  Information entered by :: Carissa Musick, RMA   Activities of Daily Living     05/04/2023    5:15 PM  In your present state of health, do you have any difficulty performing the following activities:  Hearing? 0  Vision? 0  Difficulty concentrating or making decisions? 0  Walking or climbing stairs? 0  Dressing or bathing? 0  Doing errands, shopping? 0  Preparing Food and eating ? N  Using the Toilet? N  In the past six months, have you accidently leaked urine? N  Do you have problems with loss of bowel control? N  Managing your Medications? N  Managing your Finances? N  Housekeeping or managing your Housekeeping? N    Patient Care Team: Plotnikov, Oakley Bellman, MD as PCP - General (Internal Medicine) Jacqueline Matsu, MD as PCP - Cardiology (Cardiology) Bensimhon, Rheta Celestine, MD as PCP - Advanced Heart Failure (Cardiology) Boyce Byes, MD as PCP - Electrophysiology (Cardiology) Emilie Harden, MD as Consulting Physician (Internal Medicine) Gearl Keens, MD as Consulting Physician (Neurosurgery) Dema Filler, MD as Consulting Physician (Ophthalmology)  Indicate any recent Medical Services you may have received from other than [facility] providers in the past year (date may be  approximate).     Assessment:   This is a routine wellness examination for [place].  Hearing/Vision screen Hearing Screening - Comments:: Denies hearing difficulties   Vision Screening - Comments:: Wears eyeglasses   Goals Addressed             This Visit's Progress    To maintain my current health status by continuing to eat healthy,  stay physically active and socially active.   On track      Depression Screen     05/08/2023    8:19 AM 06/28/2022    7:59 AM 01/24/2022    7:56 AM 12/22/2021   10:52 AM 07/20/2021    8:42 AM 03/22/2021    8:57 AM 02/23/2021    9:53 AM  PHQ 2/9 Scores  PHQ - 2 Score 0 0 0 0 0 0 0  PHQ- 9 Score 0          Fall Risk     05/04/2023    5:15 PM 06/28/2022    7:59 AM 01/24/2022    7:56 AM 12/22/2021   10:47 AM 07/20/2021    8:42 AM  Fall Risk   Falls in the past year? 0 0 0 0 0  Number falls in past yr: 0 0 0 0 0  Injury with Fall? 0 0 0 0 0  Risk for fall due to : No Fall Risks No Fall Risks No Fall Risks No Fall Risks No Fall Risks  Follow up Falls evaluation completed;Falls prevention discussed Falls evaluation completed Falls evaluation completed Falls prevention discussed Falls evaluation completed    MEDICARE RISK AT HOME:  Medicare Risk at Home Any stairs in or around the home?: (Patient-Rptd) Yes If so, are there any without handrails?: (Patient-Rptd) No Home free of loose throw rugs in walkways, pet beds, electrical cords, etc?: (Patient-Rptd) Yes Adequate lighting in your home to reduce risk of falls?: (Patient-Rptd) Yes Life alert?: (Patient-Rptd) No Use of a cane, walker or w/c?: (Patient-Rptd) No Grab bars in the bathroom?: (Patient-Rptd) No Shower chair or bench in shower?: (Patient-Rptd) No Elevated toilet seat or a handicapped toilet?: (Patient-Rptd) No  TIMED UP AND GO:  Was the test performed?  No  Cognitive Function: Declined/Normal: No cognitive concerns noted by patient or family. Patient alert, oriented, able to  answer questions appropriately and recall recent events. No signs of memory loss or confusion.        12/22/2021   10:52 AM  6CIT Screen  What Year? 0 points  What month? 0 points  What time? 0 points  Count back from 20 0 points  Months in reverse 0 points  Repeat phrase 0 points  Total Score 0 points    Immunizations Immunization History  Administered Date(s) Administered   Fluad Quad(high Dose 65+) 11/04/2018, 10/30/2019   Hepatitis A, Adult 06/24/2014   Influenza, High Dose Seasonal PF 09/28/2014, 09/30/2021   Influenza,inj,Quad PF,6+ Mos 11/03/2013, 09/09/2015, 10/23/2016, 10/29/2017   Influenza-Unspecified 10/19/2020   Moderna Sars-Covid-2 Vaccination 03/13/2019, 04/10/2019, 10/30/2019   PFIZER Comirnaty(Gray Top)Covid-19 Tri-Sucrose Vaccine 07/22/2020, 10/19/2020   PNEUMOCOCCAL CONJUGATE-20 01/24/2022   Pfizer(Comirnaty)Fall Seasonal Vaccine 12 years and older 09/30/2021, 10/18/2022   Pneumococcal Conjugate-13 10/06/2015   Pneumococcal Polysaccharide-23 10/23/2016   Td 01/03/2012   Zoster Recombinant(Shingrix ) 10/29/2017, 01/07/2018    Screening Tests Health Maintenance  Topic Date Due   DTaP/Tdap/Td (2 - Tdap) 01/02/2022   COVID-19 Vaccine (8 - 2024-25 season) 04/18/2023   OPHTHALMOLOGY EXAM  05/05/2023   Diabetic kidney evaluation - Urine ACR  06/28/2023   HEMOGLOBIN A1C  07/09/2023   INFLUENZA VACCINE  08/03/2023   FOOT EXAM  01/09/2024   Diabetic kidney evaluation - eGFR measurement  01/15/2024   Medicare Annual Wellness (AWV)  05/07/2024   Fecal DNA (Cologuard)  02/13/2026   Pneumonia Vaccine 60+ Years old  Completed   Hepatitis C Screening  Completed   Zoster Vaccines-  Shingrix   Completed   HPV VACCINES  Aged Out   Meningococcal B Vaccine  Aged Out    Health Maintenance  Health Maintenance Due  Topic Date Due   DTaP/Tdap/Td (2 - Tdap) 01/02/2022   COVID-19 Vaccine (8 - 2024-25 season) 04/18/2023   OPHTHALMOLOGY EXAM  05/05/2023   Health  Maintenance Items Addressed: See Nurse Notes  Additional Screening:  Vision Screening: Recommended annual ophthalmology exams for early detection of glaucoma and other disorders of the eye.  Dental Screening: Recommended annual dental exams for proper oral hygiene  Community Resource Referral / Chronic Care Management: CRR required this visit?  No   CCM required this visit?  No     Plan:     I have personally reviewed and noted the following in the patient's chart:   Medical and social history Use of alcohol, tobacco or illicit drugs  Current medications and supplements including opioid prescriptions. Patient is not currently taking opioid prescriptions. Functional ability and status Nutritional status Physical activity Advanced directives List of other physicians Hospitalizations, surgeries, and ER visits in previous 12 months Vitals Screenings to include cognitive, depression, and falls Referrals and appointments  In addition, I have reviewed and discussed with patient certain preventive protocols, quality metrics, and best practice recommendations. A written personalized care plan for preventive services as well as general preventive health recommendations were provided to patient.     Oliviarose Punch L Raylee Adamec, CMA   05/08/2023   After Visit Summary: (MyChart) Due to this being a telephonic visit, the after visit summary with patients personalized plan was offered to patient via MyChart   Notes: Nothing significant to report at this time.  Medical screening examination/treatment/procedure(s) were performed by non-physician practitioner and as supervising physician I was immediately available for consultation/collaboration.  I agree with above. Adelaide Holy, MD

## 2023-05-08 NOTE — Patient Instructions (Signed)
 Clifford Alvarez , Thank you for taking time to come for your Medicare Wellness Visit. I appreciate your ongoing commitment to your health goals. Please review the following plan we discussed and let me know if I can assist you in the future.   Referrals/Orders/Follow-Ups/Clinician Recommendations: It was nice speaking with you today.  Keep up the good work.  This is a list of the screening recommended for you and due dates:  Health Maintenance  Topic Date Due   DTaP/Tdap/Td vaccine (2 - Tdap) 01/02/2022   COVID-19 Vaccine (8 - 2024-25 season) 04/18/2023   Eye exam for diabetics  05/05/2023   Yearly kidney health urinalysis for diabetes  06/28/2023   Hemoglobin A1C  07/09/2023   Flu Shot  08/03/2023   Complete foot exam   01/09/2024   Yearly kidney function blood test for diabetes  01/15/2024   Medicare Annual Wellness Visit  05/07/2024   Cologuard (Stool DNA test)  02/13/2026   Pneumonia Vaccine  Completed   Hepatitis C Screening  Completed   Zoster (Shingles) Vaccine  Completed   HPV Vaccine  Aged Out   Meningitis B Vaccine  Aged Out    Advanced directives: (In Chart) A copy of your advanced directives are scanned into your chart should your provider ever need it.  Next Medicare Annual Wellness Visit scheduled for next year: Yes  Have you seen your provider in the last 6 months (3 months if uncontrolled diabetes)? Yes.  Last office visit 01/15/23.

## 2023-06-09 ENCOUNTER — Other Ambulatory Visit: Payer: Self-pay | Admitting: Internal Medicine

## 2023-06-12 LAB — HM DIABETES EYE EXAM

## 2023-06-21 ENCOUNTER — Other Ambulatory Visit: Payer: Self-pay | Admitting: Internal Medicine

## 2023-06-21 DIAGNOSIS — E1142 Type 2 diabetes mellitus with diabetic polyneuropathy: Secondary | ICD-10-CM

## 2023-06-22 ENCOUNTER — Other Ambulatory Visit: Payer: Self-pay | Admitting: Internal Medicine

## 2023-06-22 DIAGNOSIS — E1142 Type 2 diabetes mellitus with diabetic polyneuropathy: Secondary | ICD-10-CM

## 2023-07-10 ENCOUNTER — Encounter: Payer: Self-pay | Admitting: Internal Medicine

## 2023-07-10 ENCOUNTER — Ambulatory Visit (INDEPENDENT_AMBULATORY_CARE_PROVIDER_SITE_OTHER): Payer: Medicare Other | Admitting: Internal Medicine

## 2023-07-10 VITALS — BP 122/68 | HR 61 | Ht 77.0 in | Wt 209.2 lb

## 2023-07-10 DIAGNOSIS — E7800 Pure hypercholesterolemia, unspecified: Secondary | ICD-10-CM

## 2023-07-10 DIAGNOSIS — E1159 Type 2 diabetes mellitus with other circulatory complications: Secondary | ICD-10-CM | POA: Diagnosis not present

## 2023-07-10 DIAGNOSIS — G63 Polyneuropathy in diseases classified elsewhere: Secondary | ICD-10-CM | POA: Diagnosis not present

## 2023-07-10 DIAGNOSIS — Z7984 Long term (current) use of oral hypoglycemic drugs: Secondary | ICD-10-CM

## 2023-07-10 LAB — POCT GLYCOSYLATED HEMOGLOBIN (HGB A1C): Hemoglobin A1C: 5.8 % — AB (ref 4.0–5.6)

## 2023-07-10 MED ORDER — METFORMIN HCL 1000 MG PO TABS: 2000.0000 mg | ORAL_TABLET | Freq: Every day | ORAL | 3 refills | Status: AC

## 2023-07-10 NOTE — Patient Instructions (Addendum)
 Please move: - Metformin  2000 mg at dinnertime  Continue: - Farxiga  5-10 mg daily before breakfast    Please return in 6 months with your sugar log.

## 2023-07-10 NOTE — Progress Notes (Signed)
 Patient ID: Clifford Alvarez, male   DOB: August 19, 1955, 68 y.o.   MRN: [id number]  HPI: Clifford Alvarez is a 68 y.o.-year-old male, returning for f/u for DM2 dx 01/28/2013, previously insulin -dependent, now off insulin , controlled, with complications (CAD, Peripheral neuropathy). Last visit was 6 months ago.  Interim history: No increased urination, blurry vision, nausea, chest pain. Feels well and has no complaints at today's visit.  He is preparing to travel to [address] with his family this summer.  Reviewed HbA1c levels: Lab Results  Component Value Date   HGBA1C 6.2 (A) 01/09/2023   HGBA1C 6.2 06/28/2022   HGBA1C 5.7 (A) 12/12/2021   HGBA1C 6.1 (H) 05/12/2021   HGBA1C 6.0 (A) 12/13/2020   HGBA1C 5.9 (A) 06/01/2020   HGBA1C 5.9 (A) 12/08/2019   HGBA1C 6.4 07/09/2019   HGBA1C 6.4 (A) 06/30/2019   HGBA1C 6.0 (A) 02/12/2019   HGBA1C 6.0 (A) 11/11/2018   HGBA1C 6.1 (A) 05/20/2018   HGBA1C 5.8 (A) 11/19/2017   HGBA1C 6.0 05/14/2017   HGBA1C 6 01/08/2017   HGBA1C 6.3 10/26/2016   HGBA1C 5.7 08/07/2016   HGBA1C 6.0 04/17/2016   HGBA1C 6.0 12/13/2015   HGBA1C 5.9 08/12/2015   He is on: - Metformin  1000 mg 2x a day with meals >> 2000 mg at dinnertime >> but changed 1000 mg 2x a day per instructions on the prescriptions sent to the pharmacy - Farxiga  5 (-10) mg daily  He was previously on Basaglar  12 units at bedtime >> stopped 08/2016 >> restarted: 6-8 units 3x a day >> now off completely. He was previously on Jardiance  and Invokana .  Pt checks his sugars once a day: - am:  120, 135-145, 150, 175 >> 115-135, 180 >> 120-130, 140, 155  - 2h after b'fast: 146 >> 197 >> n/c  - before lunch:  102, 128 >> n/c - 2h after lunch: 1n/c >> 126 >> n/c - before dinner:  n/c >> 124-142 >> n/c >> 115-135 >> 115-130 - 2h after dinner:  110-140s >> 130-140 >> n/c - bedtime:  120-174 >> n/c - nighttime:  123-145, 170 >> n/c Lowest sugar was 90 >> 100 >> 112 >> 115 >> 115.  He  has  hypoglycemia awareness in the 70s. Highest sugar was 140 >> 175 >> 180 >> 155.  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  Pt's meals are: - Breakfast: bagel, oatmeal, granola, eggs - Lunch: meat + 2-3 vegetables; soup; salad; sandwich - Dinner: meat + 2-3 vegetables; pasta; [place]; seafood - Snacks: 1 a day >> nuts, popcorn, dark chocolate  -No CKD, last BUN/creatinine:  Lab Results  Component Value Date   BUN 21 01/15/2023   CREATININE 1.07 01/15/2023   Lab Results  Component Value Date   GFRNONAA >60 05/12/2021   GFRNONAA >60 03/31/2021   GFRNONAA >60 11/05/2019   GFRNONAA >60 01/24/2019   GFRNONAA >89 04/13/2015   ACRs: No results found for: MICRALBCREAT On losartan .  -+ HL; reviewed latest lipid panel: Lab Results  Component Value Date   CHOL 111 06/28/2022   HDL 30.20 (L) 06/28/2022   LDLCALC 54 06/06/2021   LDLDIRECT 57.0 06/28/2022   TRIG 201.0 (H) 06/28/2022   CHOLHDL 4 06/28/2022  On Lipitor 40.  - last eye exam was on 06/12/2023: No DR, + primary open-angle glaucoma OU at [facility]. Dr. Geneva.  He is seen every 4 mo - has frequent VF tests.  He had cataract surgeries.  - He has numbness but no  tingling in his feet.  Last foot exam 01/09/2023.  He is status post ascending aortic aneurysm repair at [facility] clinic in 01/2018. He had cardioversion and ablation in 2023. He has persistent A-fib, CAD, severe OSA. He had discectomy 05/13/2021. Pain and numbness in L arm resolved.  ROS: + see HPI  I reviewed pt's medications, allergies, PMH, social hx, family hx, and changes were documented in the history of present illness. Otherwise, unchanged from my initial visit note.  Past Medical History:  Diagnosis Date   Ascending aortic aneurysm (HCC)    5.4cm ascending aorta by Chest CT 12/2018, s/p thoracic aortic aneurysm repair with Biobentall 25mm Perimount Valve, 30mm graft   Bicuspid aortic valve    no AS or AI on echo 12/2018   CAD (coronary artery  disease), native coronary artery    minimal CAD of the pLAD and diagonal with 0-24% stenosis   CHF (congestive heart failure) (HCC)    Coronary artery calcification seen on CAT scan    calcium  score 11 12/2018   Diabetes mellitus without complication (HCC)    Dysrhythmia    Glaucoma    Heart murmur    History of childhood heart murmur (Aortic)   Past Surgical History:  Procedure Laterality Date   ANTERIOR CERVICAL DECOMP/DISCECTOMY FUSION N/A 05/13/2021   Procedure: Anterior Cervical Discetomy Fusion - Cervical five-Cervical six - Cervical six-Cervical seven;  Surgeon: Onetha Kuba, MD;  Location: [facility] OR;  Service: Neurosurgery;  Laterality: N/A;   ATRIAL FIBRILLATION ABLATION N/A 07/01/2021   Procedure: ATRIAL FIBRILLATION ABLATION;  Surgeon: Cindie Ole DASEN, MD;  Location: [facility] INVASIVE CV LAB;  Service: Cardiovascular;  Laterality: N/A;   BENTALL PROCEDURE  01/10/2019   at [facility]   CARDIOVERSION N/A 04/08/2021   Procedure: CARDIOVERSION;  Surgeon: Cherrie Toribio SAUNDERS, MD;  Location: [facility] ENDOSCOPY;  Service: Cardiovascular;  Laterality: N/A;   COLONOSCOPY  2006   Dr. 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  Tobacco Use   Smoking status: Never   Smokeless tobacco: Never   Tobacco comments:    Never smoke 07/22/21  Vaping Use   Vaping status: Never Used  Substance and Sexual Activity   Alcohol use: No   Drug use: No   Sexual activity: Not on file  Other Topics Concern    Not on file  Social History Narrative   Married 38 yearsWife - DebbieSon 28Daughter 14      Lives with wife/2025   Social Drivers of Health   Financial Resource Strain: Low Risk  (05/08/2023)   Overall Financial Resource Strain (CARDIA)    Difficulty of Paying Living Expenses: Not hard at all  Food Insecurity: No Food Insecurity (05/08/2023)   Hunger Vital Sign    Worried About Running Out of Food in the Last Year: Never true    Ran Out of Food in the Last Year: Never true  Transportation Needs: No Transportation Needs (05/08/2023)   PRAPARE -  Administrator, Civil Service (Medical): No    Lack of Transportation (Non-Medical): No  Physical Activity: Sufficiently Active (05/08/2023)   Exercise Vital Sign    Days of Exercise per Week: 4 days    Minutes of Exercise per Session: 60 min  Stress: No Stress Concern Present (05/08/2023)   [employer] of Occupational Health - Occupational Stress Questionnaire    Feeling of Stress : Not at all  Social Connections: Moderately Isolated (05/08/2023)   Social Connection and Isolation Panel    Frequency of Communication with Friends and Family: More than three times a week    Frequency of Social Gatherings with Friends and Family: More than three times a week    Attends Religious Services: Never    Database administrator or Organizations: No    Attends Banker Meetings: Never    Marital Status: Married  Catering manager Violence: Not At Risk (05/08/2023)   Humiliation, Afraid, Rape, and Kick questionnaire    Fear of Current or Ex-Partner: No    Emotionally Abused: No    Physically Abused: No    Sexually Abused: No   Current Outpatient Medications on File Prior to Visit  Medication Sig Dispense Refill   albuterol  (VENTOLIN  HFA) 108 (90 Base) MCG/ACT inhaler Inhale 2 puffs into the lungs every 6 (six) hours as needed for wheezing or shortness of breath. 8 g 2   Alpha-Lipoic Acid 300 MG TABS Take 600 mg by mouth in the  morning and at bedtime.     apixaban  (ELIQUIS ) 5 MG TABS tablet TAKE 1 TABLET BY MOUTH TWICE A DAY 60 tablet 11   atorvastatin  (LIPITOR) 40 MG tablet TAKE 1 TABLET BY MOUTH DAILY AT 6 PM. 90 tablet 3   B Complex-C (SUPER B COMPLEX/VITAMIN C) TABS Take 1 tablet by mouth daily.     Cholecalciferol (VITAMIN D3) 50 MCG (2000 UT) capsule TAKE 1 CAPSULE BY MOUTH EVERY DAY 90 capsule 3   dorzolamide -timolol  (COSOPT ) 22.3-6.8 MG/ML ophthalmic solution Place 1 drop into both eyes 2 (two) times daily.     FARXIGA  10 MG TABS tablet TAKE 1 TABLET BY MOUTH DAILY BEFORE BREAKFAST. 90 tablet 3   glucose blood (ONETOUCH VERIO) test strip USE AS INSTRUCTED TO CHECK BLOOD SUGAR 1TIME A DAY 100 strip 4   Lancets (ONETOUCH DELICA PLUS LANCET33G) MISC USE TO TEST BLOOD SUGAR 3 TIMES DAILY AS INSTRUCTED. 100 each 4   losartan  (COZAAR ) 25 MG tablet TAKE 1 TABLET (25 MG TOTAL) BY MOUTH DAILY. 90 tablet 3   metFORMIN  (GLUCOPHAGE ) 1000 MG tablet TAKE 1 TABLET BY MOUTH TWICE A DAY WITH FOOD 180 tablet 3   metoprolol  succinate (TOPROL -XL) 25 MG 24 hr tablet TAKE 1 TABLET (25 MG TOTAL) BY MOUTH DAILY. 90 tablet 3   No current facility-administered medications on file prior to visit.   Allergies  Allergen Reactions   Penicillins     Childhood reaction    Family History  Problem Relation Age of Onset   Diabetes Mother    Hypertension Mother    Diabetes Father    Heart disease Father    Diabetes Brother    Diabetes Sister    PE: BP 122/68   Pulse 61   Ht 6' 5 (1.956 m)   Wt 209 lb 3.2 oz (94.9 kg)   SpO2 97%   BMI 24.81 kg/m    Wt Readings from Last 3 Encounters:  07/10/23 209 lb 3.2 oz (94.9 kg)  05/08/23 210 lb (95.3 kg)  01/15/23 210 lb (95.3 kg)   Constitutional: Slightly overweight, in NAD Eyes: EOMI, no exophthalmos ENT: no thyromegaly, no cervical lymphadenopathy Cardiovascular: RRR, No MRG Respiratory: CTA B Musculoskeletal: no deformities Skin:no rashes Neurological: no tremor with  outstretched hands  ASSESSMENT: 1. DM2, insulin -independent, controlled, with complications  - CAD - PN -stable  2. PN 2/2 diabetes  3. HL  PLAN:  1. Patient with well-controlled type 2 diabetes, on oral antidiabetic regimen with metformin  and SGLT2 inhibitor with a stable HbA1c at last visit, of 6.2%.  Sugars were at goal at last visit with the exception of a period of time when he had an upper respiratory infection and sugars are higher due to cough drops and syrups.  We discussed about the use of diabetic syrups but otherwise, we did not change his regimen.  He did feel that the sugars improved in the morning after starting to take the whole metformin  dose at night. - At today's visit, however, he mentions that he started to take metformin  and split into doses again, as he followed the pharmacy instructions.  Unfortunately, the old version of the prescription was sent recently.  We discussed about always following the instructions from the visit, at the pharmacy may have an old prescription.  Since the sugars in the morning may be slightly elevated, I did advise him to move the entire dose of metformin  at bedtime again.  We can continue the same dose of Farxiga  for now. - I suggested to: Patient Instructions  Please continue: - Metformin  2000 mg at dinnertime - Farxiga  5-10 mg daily before breakfast    Please return in 6 months with your sugar log.   - we checked his HbA1c: 5.8% (lower) - advised to check sugars at different times of the day - 1x a day, rotating check times - advised for yearly eye exams >> he is UTD - on the last foot exam he had onychodystrophy. He tried Vicks vapor rub in the past, but he did not feel that this worked well for him.  I did suggest he see podiatry.  He did not establish care yet. -Will check an ACR today - return to clinic in 6 months   2. PN 2/2 DM  - He continues to have numbness but no pain in his feet - He is on alpha lipoic acid and B  complex, which are helping  3. HL - Latest lipid panel from a year ago showed LDL at goal, HDL slightly low, triglycerides high, Lab Results  Component Value Date   CHOL 111 06/28/2022   HDL 30.20 (L) 06/28/2022   LDLCALC 54 06/06/2021   LDLDIRECT 57.0 06/28/2022   TRIG 201.0 (H) 06/28/2022   CHOLHDL 4 06/28/2022  -He is on Lipitor 40 mg daily without side effects. - He is due for another lipid panel but he would prefer to wait until his next visit with PCP later this month  Lela Fendt, MD PhD [facility] Endocrinology

## 2023-07-11 ENCOUNTER — Ambulatory Visit: Payer: Self-pay | Admitting: Internal Medicine

## 2023-07-11 LAB — MICROALBUMIN / CREATININE URINE RATIO
Creatinine, Urine: 115 mg/dL (ref 20–320)
Microalb Creat Ratio: 7 mg/g{creat} (ref <30)
Microalb, Ur: 0.8 mg/dL

## 2023-07-16 ENCOUNTER — Ambulatory Visit (INDEPENDENT_AMBULATORY_CARE_PROVIDER_SITE_OTHER): Admitting: Internal Medicine

## 2023-07-16 ENCOUNTER — Ambulatory Visit: Payer: Medicare Other | Admitting: Internal Medicine

## 2023-07-16 ENCOUNTER — Encounter: Payer: Self-pay | Admitting: Internal Medicine

## 2023-07-16 VITALS — BP 116/70 | HR 55 | Temp 97.9°F | Ht 77.0 in | Wt 209.0 lb

## 2023-07-16 DIAGNOSIS — I10 Essential (primary) hypertension: Secondary | ICD-10-CM | POA: Diagnosis not present

## 2023-07-16 DIAGNOSIS — N320 Bladder-neck obstruction: Secondary | ICD-10-CM

## 2023-07-16 DIAGNOSIS — E7800 Pure hypercholesterolemia, unspecified: Secondary | ICD-10-CM

## 2023-07-16 DIAGNOSIS — R0989 Other specified symptoms and signs involving the circulatory and respiratory systems: Secondary | ICD-10-CM

## 2023-07-16 DIAGNOSIS — L989 Disorder of the skin and subcutaneous tissue, unspecified: Secondary | ICD-10-CM | POA: Insufficient documentation

## 2023-07-16 LAB — CBC WITH DIFFERENTIAL/PLATELET
Basophils Absolute: 0 K/uL (ref 0.0–0.1)
Basophils Relative: 0.5 % (ref 0.0–3.0)
Eosinophils Absolute: 0.2 K/uL (ref 0.0–0.7)
Eosinophils Relative: 3.8 % (ref 0.0–5.0)
HCT: 42.5 % (ref 39.0–52.0)
Hemoglobin: 14.2 g/dL (ref 13.0–17.0)
Lymphocytes Relative: 32.8 % (ref 12.0–46.0)
Lymphs Abs: 1.6 K/uL (ref 0.7–4.0)
MCHC: 33.5 g/dL (ref 30.0–36.0)
MCV: 92.1 fl (ref 78.0–100.0)
Monocytes Absolute: 0.5 K/uL (ref 0.1–1.0)
Monocytes Relative: 10.1 % (ref 3.0–12.0)
Neutro Abs: 2.6 K/uL (ref 1.4–7.7)
Neutrophils Relative %: 52.8 % (ref 43.0–77.0)
Platelets: 134 K/uL — ABNORMAL LOW (ref 150.0–400.0)
RBC: 4.62 Mil/uL (ref 4.22–5.81)
RDW: 13.6 % (ref 11.5–15.5)
WBC: 4.9 K/uL (ref 4.0–10.5)

## 2023-07-16 LAB — LIPID PANEL
Cholesterol: 126 mg/dL (ref 0–200)
HDL: 37.6 mg/dL — ABNORMAL LOW (ref >39.00)
LDL Cholesterol: 58 mg/dL (ref 0–99)
NonHDL: 88.56
Total CHOL/HDL Ratio: 3
Triglycerides: 151 mg/dL — ABNORMAL HIGH (ref 0.0–149.0)
VLDL: 30.2 mg/dL (ref 0.0–40.0)

## 2023-07-16 LAB — COMPREHENSIVE METABOLIC PANEL WITH GFR
ALT: 15 U/L (ref 0–53)
AST: 18 U/L (ref 0–37)
Albumin: 4.6 g/dL (ref 3.5–5.2)
Alkaline Phosphatase: 78 U/L (ref 39–117)
BUN: 15 mg/dL (ref 6–23)
CO2: 28 meq/L (ref 19–32)
Calcium: 9.3 mg/dL (ref 8.4–10.5)
Chloride: 105 meq/L (ref 96–112)
Creatinine, Ser: 1.12 mg/dL (ref 0.40–1.50)
GFR: 67.14 mL/min (ref >60.00)
Glucose, Bld: 130 mg/dL — ABNORMAL HIGH (ref 70–99)
Potassium: 4.7 meq/L (ref 3.5–5.1)
Sodium: 141 meq/L (ref 135–145)
Total Bilirubin: 1 mg/dL (ref 0.2–1.2)
Total Protein: 6.8 g/dL (ref 6.0–8.3)

## 2023-07-16 LAB — PSA: PSA: 0.87 ng/mL (ref 0.10–4.00)

## 2023-07-16 MED ORDER — TRIAMCINOLONE ACETONIDE 0.5 % EX OINT: 1.0000 | TOPICAL_OINTMENT | Freq: Two times a day (BID) | CUTANEOUS | 0 refills | Status: AC

## 2023-07-16 NOTE — Progress Notes (Signed)
 Subjective:  Patient ID: Clifford Alvarez, male    DOB: September 01, 1955  Age: 68 y.o. MRN: [id number]  CC: Medical Management of Chronic Issues (6 mnth f/u)   HPI Clifford Alvarez presents for DM, HTN, CAD  Outpatient Medications Prior to Visit  Medication Sig Dispense Refill   albuterol  (VENTOLIN  HFA) 108 (90 Base) MCG/ACT inhaler Inhale 2 puffs into the lungs every 6 (six) hours as needed for wheezing or shortness of breath. 8 g 2   Alpha-Lipoic Acid 300 MG TABS Take 600 mg by mouth in the morning and at bedtime.     apixaban  (ELIQUIS ) 5 MG TABS tablet TAKE 1 TABLET BY MOUTH TWICE A DAY 60 tablet 11   atorvastatin  (LIPITOR) 40 MG tablet TAKE 1 TABLET BY MOUTH DAILY AT 6 PM. 90 tablet 3   B Complex-C (SUPER B COMPLEX/VITAMIN C) TABS Take 1 tablet by mouth daily.     Cholecalciferol (VITAMIN D3) 50 MCG (2000 UT) capsule TAKE 1 CAPSULE BY MOUTH EVERY DAY 90 capsule 3   dorzolamide -timolol  (COSOPT ) 22.3-6.8 MG/ML ophthalmic solution Place 1 drop into both eyes 2 (two) times daily.     FARXIGA  10 MG TABS tablet TAKE 1 TABLET BY MOUTH DAILY BEFORE BREAKFAST. 90 tablet 3   glucose blood (ONETOUCH VERIO) test strip USE AS INSTRUCTED TO CHECK BLOOD SUGAR 1TIME A DAY 100 strip 4   Lancets (ONETOUCH DELICA PLUS LANCET33G) MISC USE TO TEST BLOOD SUGAR 3 TIMES DAILY AS INSTRUCTED. 100 each 4   losartan  (COZAAR ) 25 MG tablet TAKE 1 TABLET (25 MG TOTAL) BY MOUTH DAILY. 90 tablet 3   metFORMIN  (GLUCOPHAGE ) 1000 MG tablet Take 2 tablets (2,000 mg total) by mouth daily with supper. 180 tablet 3   metoprolol  succinate (TOPROL -XL) 25 MG 24 hr tablet TAKE 1 TABLET (25 MG TOTAL) BY MOUTH DAILY. 90 tablet 3   No facility-administered medications prior to visit.    ROS: Review of Systems  Constitutional:  Negative for appetite change, fatigue and unexpected weight change.  HENT:  Negative for congestion, nosebleeds, sneezing, sore throat and trouble swallowing.   Eyes:  Negative for itching and visual  disturbance.  Respiratory:  Negative for cough.   Cardiovascular:  Negative for chest pain, palpitations and leg swelling.  Gastrointestinal:  Negative for abdominal distention, blood in stool, diarrhea and nausea.  Genitourinary:  Negative for frequency and hematuria.  Musculoskeletal:  Negative for back pain, gait problem, joint swelling and neck pain.  Skin:  Negative for rash.  Neurological:  Negative for dizziness, tremors, speech difficulty and weakness.  Psychiatric/Behavioral:  Negative for agitation, dysphoric mood and sleep disturbance. The patient is not nervous/anxious.     Objective:  BP 116/70   Pulse (!) 55   Temp 97.9 F (36.6 C) (Oral)   Ht 6' 5 (1.956 m)   Wt 209 lb (94.8 kg)   SpO2 97%   BMI 24.78 kg/m   BP Readings from Last 3 Encounters:  07/16/23 116/70  07/10/23 122/68  01/15/23 112/70    Wt Readings from Last 3 Encounters:  07/16/23 209 lb (94.8 kg)  07/10/23 209 lb 3.2 oz (94.9 kg)  05/08/23 210 lb (95.3 kg)    Physical Exam Constitutional:      General: He is not in acute distress.    Appearance: Normal appearance. He is well-developed.     Comments: NAD  Eyes:     Conjunctiva/sclera: Conjunctivae normal.     Pupils: Pupils are equal, round, and reactive  to light.  Neck:     Thyroid : No thyromegaly.     Vascular: No JVD.  Cardiovascular:     Rate and Rhythm: Normal rate and regular rhythm.     Heart sounds: Normal heart sounds. No murmur heard.    No friction rub. No gallop.  Pulmonary:     Effort: Pulmonary effort is normal. No respiratory distress.     Breath sounds: Normal breath sounds. No wheezing or rales.  Chest:     Chest wall: No tenderness.  Abdominal:     General: Bowel sounds are normal. There is no distension.     Palpations: Abdomen is soft. There is no mass.     Tenderness: There is no abdominal tenderness. There is no guarding or rebound.  Musculoskeletal:        General: No tenderness. Normal range of motion.      Cervical back: Normal range of motion.     Right lower leg: No edema.     Left lower leg: No edema.  Lymphadenopathy:     Cervical: No cervical adenopathy.  Skin:    General: Skin is warm and dry.     Findings: No rash.  Neurological:     Mental Status: He is alert and oriented to person, place, and time.     Cranial Nerves: No cranial nerve deficit.     Motor: No abnormal muscle tone.     Coordination: Coordination normal.     Gait: Gait normal.     Deep Tendon Reflexes: Reflexes are normal and symmetric.  Psychiatric:        Behavior: Behavior normal.        Thought Content: Thought content normal.        Judgment: Judgment normal.   Scaly skin under R eye Mild bruit B R>L  Lab Results  Component Value Date   WBC 5.9 06/28/2022   HGB 14.3 06/28/2022   HCT 44.5 06/28/2022   PLT 144.0 (L) 06/28/2022   GLUCOSE 155 (H) 01/15/2023   CHOL 111 06/28/2022   TRIG 201.0 (H) 06/28/2022   HDL 30.20 (L) 06/28/2022   LDLDIRECT 57.0 06/28/2022   LDLCALC 54 06/06/2021   ALT 21 01/15/2023   AST 20 01/15/2023   NA 142 01/15/2023   K 4.3 01/15/2023   CL 107 01/15/2023   CREATININE 1.07 01/15/2023   BUN 21 01/15/2023   CO2 30 01/15/2023   TSH 2.18 01/15/2023   PSA 1.15 06/28/2022   HGBA1C 5.8 (A) 07/10/2023   MICROALBUR 0.8 07/10/2023    ECHOCARDIOGRAM COMPLETE Result Date: 11/17/2022    ECHOCARDIOGRAM REPORT   Patient Name:   Clifford Alvarez Date of Exam: 11/15/2022 Medical Rec #:  [id number]            Height:       77.0 in Accession #:    [id number]           Weight:       211.2 lb Date of Birth:  1955/07/26             BSA:          2.289 m Patient Age:    67 years             BP:           143/74 mmHg Patient Gender: M                    HR:  63 bpm. Exam Location:  Outpatient Procedure: 2D Echo, 3D Echo, Cardiac Doppler, Color Doppler and Strain Analysis Indications:    Persistent atrial fibrillation  History:        Patient has prior history of Echocardiogram  examinations, most                 recent 04/08/2021. CAD, Arrythmias:Atrial Fibrillation; Risk                 Factors:Hypertension, Diabetes and Sleep Apnea. Bicuspid AV s/p                 AVR/root replacement/[facility] 01/10/19.                 Aortic Valve: 25 mm Perimount bioprosthetic valve is present in                 the aortic position. Procedure Date: 01/10/2019.  Sonographer:    Clotilda Center Referring Phys: [id number] DANIEL R BENSIMHON  Sonographer Comments: Global longitudinal strain was attempted. IMPRESSIONS  1. Left ventricular ejection fraction, by estimation, is 50 to 55%. Left ventricular ejection fraction by 3D volume is 52 %. The left ventricle has low normal function. The left ventricle has no regional wall motion abnormalities. There is mild left ventricular hypertrophy. Left ventricular diastolic parameters are indeterminate.  2. Right ventricular systolic function is mildly reduced. The right ventricular size is normal. There is normal pulmonary artery systolic pressure.  3. The mitral valve is normal in structure. Trivial mitral valve regurgitation. No evidence of mitral stenosis.  4. The aortic valve has been repaired/replaced. Aortic valve regurgitation is not visualized. No aortic stenosis is present. There is a 25 mm Perimount bioprosthetic valve present in the aortic position. Procedure Date: 01/10/2019. Echo findings are consistent with normal structure and function of the aortic valve prosthesis. Aortic valve mean gradient measures 12.5 mmHg.  5. Aortic root/ascending aorta has been repaired/replaced.  6. The inferior vena cava is normal in size with greater than 50% respiratory variability, suggesting right atrial pressure of 3 mmHg. FINDINGS  Left Ventricle: Left ventricular ejection fraction, by estimation, is 50 to 55%. Left ventricular ejection fraction by 3D volume is 52 %. The left ventricle has low normal function. The left ventricle has no regional wall motion abnormalities. Global  longitudinal strain performed but not reported based on interpreter judgement due to suboptimal tracking. The left ventricular internal cavity size was normal in size. There is mild left ventricular hypertrophy. Left ventricular diastolic parameters are indeterminate. Right Ventricle: The right ventricular size is normal. No increase in right ventricular wall thickness. Right ventricular systolic function is mildly reduced. There is normal pulmonary artery systolic pressure. The tricuspid regurgitant velocity is 1.78 m/s, and with an assumed right atrial pressure of 3 mmHg, the estimated right ventricular systolic pressure is 15.7 mmHg. Left Atrium: Left atrial size was normal in size. Right Atrium: Right atrial size was normal in size. Pericardium: There is no evidence of pericardial effusion. Mitral Valve: The mitral valve is normal in structure. Trivial mitral valve regurgitation. No evidence of mitral valve stenosis. Tricuspid Valve: The tricuspid valve is normal in structure. Tricuspid valve regurgitation is mild . No evidence of tricuspid stenosis. Aortic Valve: The aortic valve has been repaired/replaced. Aortic valve regurgitation is not visualized. No aortic stenosis is present. Aortic valve mean gradient measures 12.5 mmHg. Aortic valve peak gradient measures 20.4 mmHg. Aortic valve area, by VTI measures 2.98 cm. There is a 25 mm Perimount  bioprosthetic valve present in the aortic position. Procedure Date: 01/10/2019. Echo findings are consistent with normal structure and function of the aortic valve prosthesis. Pulmonic Valve: The pulmonic valve was normal in structure. Pulmonic valve regurgitation is mild. No evidence of pulmonic stenosis. Aorta: The aortic root/ascending aorta has been repaired/replaced. Venous: The inferior vena cava is normal in size with greater than 50% respiratory variability, suggesting right atrial pressure of 3 mmHg. IAS/Shunts: No atrial level shunt detected by color flow  Doppler.  LEFT VENTRICLE PLAX 2D LVIDd:         4.50 cm         Diastology LVIDs:         3.60 cm         LV e' medial:    6.09 cm/s LV PW:         1.00 cm         LV E/e' medial:  10.4 LV IVS:        1.10 cm         LV e' lateral:   12.50 cm/s LVOT diam:     2.30 cm         LV E/e' lateral: 5.1 LV SV:         174 LV SV Index:   76 LVOT Area:     4.15 cm        3D Volume EF                                LV 3D EF:    Left                                             ventricul LV Volumes (MOD)                            ar LV vol d, MOD    125.0 ml                   ejection A2C:                                        fraction LV vol d, MOD    177.0 ml                   by 3D A4C:                                        volume is LV vol s, MOD    60.2 ml                    52 %. A2C: LV vol s, MOD    80.6 ml A4C:                           3D Volume EF: LV SV MOD A2C:   64.8 ml       3D EF:        52 % LV SV MOD A4C:   177.0 ml  LV EDV:       191 ml LV SV MOD BP:    80.6 ml       LV ESV:       92 ml                                LV SV:        99 ml RIGHT VENTRICLE            IVC RV Basal diam:  3.65 cm    IVC diam: 2.40 cm RV S prime:     8.49 cm/s TAPSE (M-mode): 1.7 cm LEFT ATRIUM             Index        RIGHT ATRIUM           Index LA diam:        5.30 cm 2.32 cm/m   RA Area:     17.30 cm LA Vol (A2C):   56.5 ml 24.68 ml/m  RA Volume:   41.40 ml  18.08 ml/m LA Vol (A4C):   55.8 ml 24.38 ml/m LA Biplane Vol: 58.0 ml 25.34 ml/m  AORTIC VALVE                     PULMONIC VALVE AV Area (Vmax):    2.86 cm      PR End Diast Vel: 6.05 msec AV Area (Vmean):   3.03 cm AV Area (VTI):     2.98 cm AV Vmax:           226.00 cm/s AV Vmean:          168.500 cm/s AV VTI:            0.586 m AV Peak Grad:      20.4 mmHg AV Mean Grad:      12.5 mmHg LVOT Vmax:         155.36 cm/s LVOT Vmean:        122.907 cm/s LVOT VTI:          0.420 m LVOT/AV VTI ratio: 0.72  AORTA Ao Root diam: 3.30 cm Ao Asc diam:  3.10 cm MITRAL  VALVE               TRICUSPID VALVE MV Area (PHT): 3.37 cm    TR Peak grad:   12.7 mmHg MV Decel Time: 225 msec    TR Vmax:        178.00 cm/s MR Peak grad: 25.9 mmHg MR Vmax:      254.50 cm/s  SHUNTS MV E velocity: 63.20 cm/s  Systemic VTI:  0.42 m                            Systemic Diam: 2.30 cm Soyla Merck MD Electronically signed by Soyla Merck MD Signature Date/Time: 11/17/2022/7:07:27 AM    Final     Assessment & Plan:   Problem List Items Addressed This Visit     Hyperlipidemia - Primary   Relevant Orders   Lipid panel   CBC with Differential/Platelet   Hypertension   Relevant Orders   Lipid panel   Comprehensive metabolic panel with GFR   CBC with Differential/Platelet   Carotid bruit   Sch Doppler US       Relevant Orders   US  Carotid Bilateral   Skin lesion   Scaly skin  under R eye - ?SK vs eczema vs other  Derm appt in 2 mo or so Triamc oint      Other Visit Diagnoses       Bladder neck obstruction       Relevant Orders   CBC with Differential/Platelet   PSA         Meds ordered this encounter  Medications   triamcinolone  ointment (KENALOG ) 0.5 %    Sig: Apply 1 Application topically 2 (two) times daily.    Dispense:  30 g    Refill:  0      Follow-up: Return in about 6 months (around 01/16/2024) for a follow-up visit.  Marolyn Noel, MD

## 2023-07-16 NOTE — Assessment & Plan Note (Addendum)
 Scaly skin under R eye - ?SK vs eczema vs other  Derm appt in 2 mo or so Triamc oint

## 2023-07-16 NOTE — Assessment & Plan Note (Signed)
 Sch Doppler US 

## 2023-07-17 ENCOUNTER — Ambulatory Visit: Payer: Self-pay | Admitting: Internal Medicine

## 2023-07-23 ENCOUNTER — Other Ambulatory Visit (HOSPITAL_COMMUNITY): Payer: Self-pay | Admitting: Internal Medicine

## 2023-09-13 ENCOUNTER — Other Ambulatory Visit (HOSPITAL_COMMUNITY): Payer: Self-pay | Admitting: Internal Medicine

## 2023-10-10 ENCOUNTER — Ambulatory Visit: Attending: Cardiology | Admitting: Cardiology

## 2023-10-10 VITALS — BP 120/68 | HR 63 | Ht 77.0 in | Wt 209.0 lb

## 2023-10-10 DIAGNOSIS — G4733 Obstructive sleep apnea (adult) (pediatric): Secondary | ICD-10-CM | POA: Insufficient documentation

## 2023-10-10 DIAGNOSIS — I4891 Unspecified atrial fibrillation: Secondary | ICD-10-CM | POA: Insufficient documentation

## 2023-10-10 DIAGNOSIS — I10 Essential (primary) hypertension: Secondary | ICD-10-CM | POA: Insufficient documentation

## 2023-10-10 NOTE — Progress Notes (Signed)
  Electrophysiology Office Follow up Visit Note:    Date:  10/10/2023   ID:  Clifford Alvarez, DOB 1955/11/29, MRN [id number]  PCP:  Garald Karlynn GAILS, MD  [facility] Cardiologist:  Wilbert Bihari, MD  [facility] Electrophysiologist:  OLE ONEIDA HOLTS, MD    Interval History:     Clifford Alvarez is a 68 y.o. male who presents for a follow up visit.   I last saw the patient October 07, 2021.  Patient has a history of atrial fibrillation with prior catheter ablation in June 2023.  At the last appointment we discussed the pros and cons of continuing Eliquis .  He is on CPAP for obstructive sleep apnea.  Today he is doing well.  No recurrence of atrial fibrillation.        Past medical, surgical, social and family history were reviewed.  ROS:   Please see the history of present illness.    All other systems reviewed and are negative.  EKGs/Labs/Other Studies Reviewed:    The following studies were reviewed today:          Physical Exam:    VS:  BP 120/68 (BP Location: Right Arm, Cuff Size: Normal)   Pulse 63   Ht 6' 5 (1.956 m)   Wt 209 lb (94.8 kg)   BMI 24.78 kg/m     Wt Readings from Last 3 Encounters:  10/10/23 209 lb (94.8 kg)  07/16/23 209 lb (94.8 kg)  07/10/23 209 lb 3.2 oz (94.9 kg)     GEN: no distress CARD: RRR, No MRG RESP: No IWOB. CTAB.      ASSESSMENT:    1. Atrial fibrillation, unspecified type (HCC)   2. OSA (obstructive sleep apnea)   3. Primary hypertension    PLAN:    In order of problems listed above:  #Atrial fibrillation and flutter Persistent Post ablation On Eliquis  for stroke prophylaxis  #Hypertension At goal today.  Recommend checking blood pressures 1-2 times per week at home and recording the values.  Recommend bringing these recordings to the primary care physician.  #Obstructive sleep apnea Continue CPAP Refer to ear nose throat for consideration for inspire.  Follow-up 1 year with  APP   Signed, OLE HOLTS, MD, [facility], [facility] 10/10/2023 3:06 PM    Electrophysiology [facility]

## 2023-10-10 NOTE — Patient Instructions (Signed)
 Medication Instructions:  Your physician recommends that you continue on your current medications as directed. Please refer to the Current Medication list given to you today.  *If you need a refill on your cardiac medications before your next appointment, please call your pharmacy*  Lab Work: None ordered  If you have any lab test that is abnormal or we need to change your treatment, we will call you to review the results.  Testing/Procedures: None ordered  Follow-Up: At [facility], you and your health needs are our priority.  As part of our continuing mission to provide you with exceptional heart care, our providers are all part of one team.  This team includes your primary Cardiologist (physician) and Advanced Practice Providers or APPs (Physician Assistants and Nurse Practitioners) who all work together to provide you with the care you need, when you need it.  You have been referred to ENT to discuss inspire  Your next appointment:   1 year(s)  Provider:   You will see one of the following Advanced Practice Providers on your designated Care Team:   Charlies Arthur, PA-C Michael Andy Tillery, PA-C Suzann Riddle, NP Daphne Barrack, NP Artist Pouch, PA-C    Thank you for choosing [facility]!!   [phone]

## 2023-10-10 NOTE — Addendum Note (Signed)
 Addended by: GRETEL MAEOLA CROME on: 10/10/2023 03:16 PM   Modules accepted: Orders

## 2023-10-22 ENCOUNTER — Encounter (HOSPITAL_COMMUNITY): Payer: Self-pay | Admitting: Internal Medicine

## 2023-10-25 ENCOUNTER — Telehealth: Payer: Self-pay | Admitting: *Deleted

## 2023-10-25 DIAGNOSIS — I4819 Other persistent atrial fibrillation: Secondary | ICD-10-CM

## 2023-10-25 DIAGNOSIS — I10 Essential (primary) hypertension: Secondary | ICD-10-CM

## 2023-10-25 DIAGNOSIS — G4733 Obstructive sleep apnea (adult) (pediatric): Secondary | ICD-10-CM

## 2023-10-25 DIAGNOSIS — I4891 Unspecified atrial fibrillation: Secondary | ICD-10-CM

## 2023-10-25 NOTE — Telephone Encounter (Signed)
 NPSG order placed and will be precerted.  Prior Authorization for NPSG sent to MEDICARE via web portal. Tracking Number . NO PA REQ

## 2023-10-25 NOTE — Telephone Encounter (Signed)
 I hope you are well! Denise with Dr. Carlie clinic has received a referral from you all for an [facility] consult with patient, Clifford Alvarez 11/19/1955. According to his records, this patient's most recent sleep study was on 08/2021. Being that this patient has Medicare A&B, his sleep study must be within 2 years of [facility] consult.   Are you able to help facilitate getting this patient an updated sleep study? It can be an HST.   Thank you so much!    Per Dr Shlomo, Clifford Alvarez can you please order another sleep study on this patient please - recommend in lab PSG

## 2023-11-23 ENCOUNTER — Telehealth: Payer: Self-pay | Admitting: *Deleted

## 2023-11-23 NOTE — Telephone Encounter (Signed)
-----   Message from Wilbert Bihari sent at 11/20/2023  6:56 PM EST ----- Per Dr. Carlie notes he may not be a candidate for [facility] because of significant central events on initial study.  His last download shows excellent treatment success with AHI 1.6/hr on 7cm H2O which is a very low pressure and 100% compliance.  Unless he is really struggling with CPAP, Dr. Carlie recommended sticking with PAP therapy.  If he cannot stand it then could consider Inspire but would have to have repeat in lab NPSG to see how many central events he has ----- Message ----- From: Joshua Dalton MATSU, CMA Sent: 11/20/2023   5:49 PM EST To: Wilbert JONELLE Bihari, MD  Your ordered him an NPSG that is ready to be scheduled.  Kanishk, Stroebel 10/21/2023 - 11/19/2023 Patient ID: [id number] DOB: Apr 19, 1955 Age: 34 years Gender: Male [address] [place] [employer] [address] [place], [place]. [employer] [place] , [address] Compliance Report Compliance Payor Standard Usage 10/21/2023 - 11/19/2023 Usage days 30/30 days (100%) >= 4 hours 30 days (100%) < 4 hours 0 days (0%) Usage hours 219 hours 7 minutes Average usage (total days) 7 hours 18 minutes Average usage (days used) 7 hours 18 minutes Median usage (days used) 7 hours 23 minutes Total used hours (value since last reset - 11/19/2023) 4,972 hours AirSense 11 AutoSet Serial number [id number] Mode CPAP Set pressure 7 cmH2O EPR Fulltime EPR level 2 Therapy Leaks - L/min Median: 2.1 95th percentile: 12.1 Maximum: 21.4 Events per hour AI: 1.5 HI: 0.1 AHI: 1.6 Apnea Index Central: 1.0 Obstructive: 0.5 Unknown: 0.0 RERA Index 0.0 Cheyne-Stokes respiration (average duration per night) 0 minutes (0%) Usage - hours Printed on 11/20/2023 - ResMed AirView version 4.50.0-6.0 Page 1 of 1 ----- Message ----- From: Bihari Wilbert JONELLE, MD Sent: 11/14/2023   6:01 PM EST To: Cv Div Sleep Studies  Please get a download on this patient per Dr. Carlie so I can  review to see if we can decrease pressure ----- Message ----- From: Smitty Jonel CROME Sent: 11/14/2023   1:24 PM EST To: Toribio JONELLE Fuel, MD; Wilbert JONELLE Bihari, MD; #

## 2023-11-26 ENCOUNTER — Encounter: Payer: Self-pay | Admitting: *Deleted

## 2024-01-11 ENCOUNTER — Ambulatory Visit: Admitting: Internal Medicine

## 2024-01-11 ENCOUNTER — Encounter: Payer: Self-pay | Admitting: Internal Medicine

## 2024-01-11 VITALS — BP 112/60 | HR 58 | Ht 77.0 in | Wt 205.4 lb

## 2024-01-11 DIAGNOSIS — G63 Polyneuropathy in diseases classified elsewhere: Secondary | ICD-10-CM

## 2024-01-11 DIAGNOSIS — E1142 Type 2 diabetes mellitus with diabetic polyneuropathy: Secondary | ICD-10-CM | POA: Diagnosis not present

## 2024-01-11 DIAGNOSIS — E7800 Pure hypercholesterolemia, unspecified: Secondary | ICD-10-CM

## 2024-01-11 DIAGNOSIS — Z7984 Long term (current) use of oral hypoglycemic drugs: Secondary | ICD-10-CM | POA: Diagnosis not present

## 2024-01-11 LAB — POCT GLYCOSYLATED HEMOGLOBIN (HGB A1C): Hemoglobin A1C: 6.1 % — AB (ref 4.0–5.6)

## 2024-01-11 NOTE — Patient Instructions (Signed)
 Please continue: - Metformin 2000 mg at dinnertime - Farxiga 5-10 mg daily before breakfast    Please return in 6 months with your sugar log.

## 2024-01-11 NOTE — Progress Notes (Signed)
 Patient ID: Clifford Alvarez, male   DOB: 02-May-1955, 69 y.o.   MRN: [id number]  HPI: Clifford Alvarez is a 69 y.o.-year-old male, returning for f/u for DM2 dx 01/28/2013, previously insulin -dependent, now off insulin , controlled, with complications (CAD, Peripheral neuropathy). Last visit was 6 months ago.  Interim history: No increased urination, blurry vision, nausea, chest pain. He feels well and has no complaints at today's visit.   He has more stress >> mom sick, his stepfather died. He has OSA and continues on CPAP.  He decided not to pursue the [facility] device.  Reviewed HbA1c levels: Lab Results  Component Value Date   HGBA1C 5.8 (A) 07/10/2023   HGBA1C 6.2 (A) 01/09/2023   HGBA1C 6.2 06/28/2022   HGBA1C 5.7 (A) 12/12/2021   HGBA1C 6.1 (H) 05/12/2021   HGBA1C 6.0 (A) 12/13/2020   HGBA1C 5.9 (A) 06/01/2020   HGBA1C 5.9 (A) 12/08/2019   HGBA1C 6.4 07/09/2019   HGBA1C 6.4 (A) 06/30/2019   HGBA1C 6.0 (A) 02/12/2019   HGBA1C 6.0 (A) 11/11/2018   HGBA1C 6.1 (A) 05/20/2018   HGBA1C 5.8 (A) 11/19/2017   HGBA1C 6.0 05/14/2017   HGBA1C 6 01/08/2017   HGBA1C 6.3 10/26/2016   HGBA1C 5.7 08/07/2016   HGBA1C 6.0 04/17/2016   HGBA1C 6.0 12/13/2015   He is on: - Metformin  1000 mg 2x a day with meals >> 2000 mg at dinnertime >> 1000 mg 2x a day >> 2000 mg at night - Farxiga  5 (-10) mg daily  He was previously on Basaglar  12 units at bedtime >> stopped 08/2016 >> restarted: 6-8 units 3x a day >> now off completely. He was previously on Jardiance  and Invokana .  Pt checks his sugars once a day: - am:  115-135, 180 >> 120-130, 140, 155 >> 115-130, 140s - 2h after b'fast: 146 >> 197 >> n/c  - before lunch:  102, 128 >> n/c - 2h after lunch: 1n/c >> 126 >> n/c - before dinner: 115-135 >> 115-130 >> 115-130 - 2h after dinner:  110-140s >> 130-140 >> n/c - bedtime:  120-174 >> n/c - nighttime:  123-145, 170 >> n/c Lowest sugar was 90 >> ... 115 >> 115.  He  has hypoglycemia  awareness in the 70s. 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  Pt's meals are: - Breakfast: bagel, oatmeal, granola, eggs - Lunch: meat + 2-3 vegetables; soup; salad; sandwich - Dinner: meat + 2-3 vegetables; pasta; mexican; seafood - Snacks: 1 a day >> nuts, popcorn, dark chocolate  -No CKD, last BUN/creatinine:  Lab Results  Component Value Date   BUN 15 07/16/2023   CREATININE 1.12 07/16/2023   Lab Results  Component Value Date   MICRALBCREAT 7 07/10/2023  On losartan .  -+ HL; reviewed latest lipid panel: Lab Results  Component Value Date   CHOL 126 07/16/2023   HDL 37.60 (L) 07/16/2023   LDLCALC 58 07/16/2023   LDLDIRECT 57.0 06/28/2022   TRIG 151.0 (H) 07/16/2023   CHOLHDL 3 07/16/2023  On Lipitor 40.  - last eye exam was on 06/12/2023: No DR, + primary open-angle glaucoma O[facility] at [facility]. Dr. Geneva.  He is seen every 4 mo - has frequent VF tests.  He had cataract surgeries.  - He has numbness but no tingling in his feet.  Last foot exam 01/09/2023.  He is status post ascending aortic aneurysm repair at [facility] clinic in 01/2018. He had cardioversion and ablation in 2023.  He has persistent A-fib, CAD, severe OSA. He had discectomy 05/13/2021. Pain and numbness in L arm resolved.  ROS: + see HPI  I reviewed pt's medications, allergies, PMH, social hx, family hx, and changes were documented in the history of present illness. Otherwise, unchanged from my initial visit note.  Past Medical History:  Diagnosis Date   Ascending aortic aneurysm    5.4cm ascending aorta by Chest CT 12/2018, s/p thoracic aortic aneurysm repair with Biobentall 25mm Perimount Valve, 30mm graft   Bicuspid aortic valve    no AS or AI on echo 12/2018   CAD (coronary artery disease), native coronary artery    minimal CAD of the pLAD and diagonal with 0-24% stenosis   CHF (congestive heart failure) (HCC)    Coronary artery calcification seen on CAT  scan    calcium  score 11 12/2018   Diabetes mellitus without complication (HCC)    Dysrhythmia    Glaucoma    Heart murmur    History of childhood heart murmur (Aortic)   Past Surgical History:  Procedure Laterality Date   ANTERIOR CERVICAL DECOMP/DISCECTOMY FUSION N/A 05/13/2021   Procedure: Anterior Cervical Discetomy Fusion - Cervical five-Cervical six - Cervical six-Cervical seven;  Surgeon: Onetha Kuba, MD;  Location: [facility] OR;  Service: Neurosurgery;  Laterality: N/A;   ATRIAL FIBRILLATION ABLATION N/A 07/01/2021   Procedure: ATRIAL FIBRILLATION ABLATION;  Surgeon: Cindie Ole DASEN, MD;  Location: [facility] INVASIVE CV LAB;  Service: Cardiovascular;  Laterality: N/A;   BENTALL PROCEDURE  01/10/2019   at [facility]   CARDIOVERSION N/A 04/08/2021   Procedure: CARDIOVERSION;  Surgeon: Cherrie Toribio SAUNDERS, MD;  Location: [facility] ENDOSCOPY;  Service: Cardiovascular;  Laterality: N/A;   COLONOSCOPY  2006   Dr. 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  Tobacco Use   Smoking status: Never   Smokeless tobacco: Never   Tobacco comments:    Never smoke 07/22/21  Vaping Use   Vaping status: Never Used  Substance and Sexual Activity   Alcohol use: No   Drug use: No   Sexual activity: Not on file  Other Topics Concern   Not on file  Social History Narrative   Married 38 yearsWife - DebbieSon 28Daughter 47      Lives with wife/2025   Social Drivers of Health   Tobacco Use: Low Risk  (11/14/2023)   Received from [facility]   Patient History    Smoking Tobacco Use: Never    Smokeless Tobacco Use: Never    Passive Exposure: Not on file  Financial Resource Strain: Low Risk (07/12/2023)   Overall Financial Resource Strain (CARDIA)    Difficulty of Paying Living Expenses: Not hard at all  Food Insecurity: No Food Insecurity (07/12/2023)   Epic    Worried About Radiation Protection Practitioner of Food in the Last Year: Never true    Ran Out of Food in the Last Year: Never true  Transportation Needs: No Transportation Needs (07/12/2023)  Epic    Lack of Transportation (Medical): No    Lack of Transportation (Non-Medical): No  Physical Activity: Sufficiently Active (07/12/2023)   Exercise Vital Sign    Days of Exercise per Week: 4 days    Minutes of Exercise per Session: 40 min  Stress: No Stress Concern Present (07/12/2023)   [employer] of Occupational Health - Occupational Stress Questionnaire    Feeling of Stress: Not at all  Social Connections: Moderately Integrated (07/12/2023)   Social Connection and Isolation Panel    Frequency of Communication with Friends and Family: More than three times a week    Frequency of Social Gatherings with Friends and Family: Three times a week    Attends Religious Services: Never    Active Member of Clubs or Organizations: Yes    Attends Engineer, Structural: More than 4 times per year    Marital Status: Married  Recent Concern: Social Connections - Moderately Isolated (05/08/2023)   Social Connection and Isolation Panel    Frequency of Communication with Friends and Family: More than three times a week    Frequency of Social Gatherings with Friends and Family: More than three times a week    Attends Religious Services: Never    Database Administrator or Organizations: No    Attends Banker Meetings: Never    Marital Status: Married  Catering Manager Violence: Not At Risk (05/08/2023)   Humiliation, Afraid, Rape, and Kick  questionnaire    Fear of Current or Ex-Partner: No    Emotionally Abused: No    Physically Abused: No    Sexually Abused: No  Depression (PHQ2-9): Low Risk (07/16/2023)   Depression (PHQ2-9)    PHQ-2 Score: 0  Alcohol Screen: Low Risk (07/12/2023)   Alcohol Screen    Last Alcohol Screening Score (AUDIT): 2  Housing: Unknown (07/12/2023)   Epic    Unable to Pay for Housing in the Last Year: No    Number of Times Moved in the Last Year: Not on file    Homeless in the Last Year: No  Utilities: Not At Risk (05/08/2023)   AHC Utilities    Threatened with loss of utilities: No  Health Literacy: Adequate Health Literacy (05/08/2023)   B1300 Health Literacy    Frequency of need for help with medical instructions: Never   Current Outpatient Medications on File Prior to Visit  Medication Sig Dispense Refill   albuterol  (VENTOLIN  HFA) 108 (90 Base) MCG/ACT inhaler Inhale 2 puffs into the lungs every 6 (six) hours as needed for wheezing or shortness of breath. 8 g 2   Alpha-Lipoic Acid 300 MG TABS Take 600 mg by mouth in the morning and at bedtime.     atorvastatin  (LIPITOR) 40 MG tablet TAKE 1 TABLET BY MOUTH DAILY AT 6 PM. 90 tablet 3   B Complex-C (SUPER B COMPLEX/VITAMIN C) TABS Take 1 tablet by mouth daily.     Cholecalciferol (VITAMIN D3) 50 MCG (2000 UT) capsule TAKE 1 CAPSULE BY MOUTH EVERY DAY 90 capsule 3   dorzolamide -timolol  (COSOPT ) 22.3-6.8 MG/ML ophthalmic solution Place 1 drop into both eyes 2 (two) times daily.     ELIQUIS  5 MG TABS tablet TAKE 1 TABLET BY MOUTH TWICE A DAY 60 tablet 11   FARXIGA  10 MG TABS tablet TAKE 1 TABLET BY MOUTH DAILY BEFORE BREAKFAST. 90 tablet 3   glucose blood (ONETOUCH VERIO) test strip USE AS INSTRUCTED TO CHECK BLOOD SUGAR 1TIME A DAY 100 strip  4   Lancets (ONETOUCH DELICA PLUS LANCET33G) MISC USE TO TEST BLOOD SUGAR 3 TIMES DAILY AS INSTRUCTED. 100 each 4   losartan  (COZAAR ) 25 MG tablet TAKE 1 TABLET (25 MG TOTAL) BY MOUTH DAILY. 90 tablet 3    metFORMIN  (GLUCOPHAGE ) 1000 MG tablet Take 2 tablets (2,000 mg total) by mouth daily with supper. 180 tablet 3   metoprolol  succinate (TOPROL -XL) 25 MG 24 hr tablet TAKE 1 TABLET (25 MG TOTAL) BY MOUTH DAILY. 90 tablet 3   triamcinolone  ointment (KENALOG ) 0.5 % Apply 1 Application topically 2 (two) times daily. (Patient not taking: Reported on 10/10/2023) 30 g 0   No current facility-administered medications on file prior to visit.   Allergies  Allergen Reactions   Penicillins     Childhood reaction    Family History  Problem Relation Age of Onset   Diabetes Mother    Hypertension Mother    Diabetes Father    Heart disease Father    Diabetes Brother    Diabetes Sister    PE: BP 112/60   Pulse (!) 58   Ht 6' 5 (1.956 m)   Wt 205 lb 6.4 oz (93.2 kg)   SpO2 94%   BMI 24.36 kg/m    Wt Readings from Last 3 Encounters:  01/11/24 205 lb 6.4 oz (93.2 kg)  10/10/23 209 lb (94.8 kg)  07/16/23 209 lb (94.8 kg)   Constitutional: Normal  weight, in NAD Eyes: EOMI, no exophthalmos ENT: no thyromegaly, no cervical lymphadenopathy Cardiovascular: RRR, No MRG Respiratory: CTA B Musculoskeletal: no deformities Skin:no rashes Neurological: no tremor with outstretched hands Diabetic Foot Exam - Simple   Simple Foot Form Diabetic Foot exam was performed with the following findings: Yes 01/11/2024  8:39 AM  Visual Inspection No deformities, no ulcerations, no other skin breakdown bilaterally: Yes Sensation Testing Intact to touch and monofilament testing bilaterally: Yes Pulse Check Posterior Tibialis and Dorsalis pulse intact bilaterally: Yes Comments + onychodystrophy in all toenails + hammertoe deformity + dry skin    ASSESSMENT: 1. DM2, insulin -independent, controlled, with complications  - CAD - PN -stable  2. PN 2/2 diabetes  3. HL  PLAN:  1. Patient with well-controlled type 2 diabetes, on metformin  and SGLT2 inhibitor, with an improvement in HbA1c at last visit, at  5.8%, decreased from 6.2%.  At that time, sugars were at goal with only occasional slightly elevated morning blood sugars, after splitting metformin  into doses.  We moved the entire dose of metformin  back to dinnertime.  We did not change the Farxiga  dose. - At today's visit, last majority of his blood sugars are at goal, but he mentions seeing very slightly elevated blood sugars in the morning during the holidays.  However, due to the excellent control overall, I did not suggest a change in regimen today.  I advised him to let me know when we need to refill his medications. - I suggested to: Patient Instructions  Please continue: - Metformin  2000 mg at dinnertime - Farxiga  5-10 mg daily before breakfast    Please return in 6 months with your sugar log.   - we checked his HbA1c: 6.1% (slightly higher) - advised to check sugars at different times of the day - 1x a day, rotating check times - on the last foot exam he had onychodystrophy. He tried Vicks vapor rub in the past, but he did not feel that this worked well for him.  I suggested to see podiatry but he did not have  the appointment yet.  We again discussed about this and he agrees to schedule an appointment. - advised for yearly eye exams >> he is UTD - return to clinic in 6 months  2. PN 2/2 DM  - He numbness but no pain in his feet - He continues on alpha lipoic acid and B complex, which are helping  3. HL - Latest lipid panel was reviewed from 6 months ago: LDL and triglycerides at goal, HDL slightly low: Lab Results  Component Value Date   CHOL 126 07/16/2023   HDL 37.60 (L) 07/16/2023   LDLCALC 58 07/16/2023   LDLDIRECT 57.0 06/28/2022   TRIG 151.0 (H) 07/16/2023   CHOLHDL 3 07/16/2023  -He continues on Lipitor 40 mg daily without side effects  Lela Fendt, MD PhD [facility] Endocrinology

## 2024-01-16 ENCOUNTER — Ambulatory Visit: Admitting: Internal Medicine

## 2024-01-16 ENCOUNTER — Encounter: Payer: Self-pay | Admitting: Internal Medicine

## 2024-01-16 VITALS — BP 106/66 | HR 65 | Ht 77.0 in | Wt 209.4 lb

## 2024-01-16 DIAGNOSIS — E7800 Pure hypercholesterolemia, unspecified: Secondary | ICD-10-CM

## 2024-01-16 DIAGNOSIS — E1142 Type 2 diabetes mellitus with diabetic polyneuropathy: Secondary | ICD-10-CM | POA: Diagnosis not present

## 2024-01-16 DIAGNOSIS — E119 Type 2 diabetes mellitus without complications: Secondary | ICD-10-CM

## 2024-01-16 DIAGNOSIS — I10 Essential (primary) hypertension: Secondary | ICD-10-CM

## 2024-01-16 DIAGNOSIS — Z7984 Long term (current) use of oral hypoglycemic drugs: Secondary | ICD-10-CM

## 2024-01-16 DIAGNOSIS — Z0000 Encounter for general adult medical examination without abnormal findings: Secondary | ICD-10-CM

## 2024-01-16 NOTE — Progress Notes (Signed)
 "  Subjective:  Patient ID: Clifford Alvarez, male    DOB: 24-Dec-1955  Age: 69 y.o. MRN: [id number]  CC: Medical Management of Chronic Issues (6 Month follow up)   HPI Clifford Alvarez presents for DM, HTN  Outpatient Medications Prior to Visit  Medication Sig Dispense Refill   albuterol  (VENTOLIN  HFA) 108 (90 Base) MCG/ACT inhaler Inhale 2 puffs into the lungs every 6 (six) hours as needed for wheezing or shortness of breath. 8 g 2   Alpha-Lipoic Acid 300 MG TABS Take 600 mg by mouth in the morning and at bedtime.     atorvastatin  (LIPITOR) 40 MG tablet TAKE 1 TABLET BY MOUTH DAILY AT 6 PM. 90 tablet 3   B Complex-C (SUPER B COMPLEX/VITAMIN C) TABS Take 1 tablet by mouth daily.     Cholecalciferol (VITAMIN D3) 50 MCG (2000 UT) capsule TAKE 1 CAPSULE BY MOUTH EVERY DAY 90 capsule 3   dorzolamide -timolol  (COSOPT ) 22.3-6.8 MG/ML ophthalmic solution Place 1 drop into both eyes 2 (two) times daily.     ELIQUIS  5 MG TABS tablet TAKE 1 TABLET BY MOUTH TWICE A DAY 60 tablet 11   FARXIGA  10 MG TABS tablet TAKE 1 TABLET BY MOUTH DAILY BEFORE BREAKFAST. 90 tablet 3   glucose blood (ONETOUCH VERIO) test strip USE AS INSTRUCTED TO CHECK BLOOD SUGAR 1TIME A DAY 100 strip 4   Lancets (ONETOUCH DELICA PLUS LANCET33G) MISC USE TO TEST BLOOD SUGAR 3 TIMES DAILY AS INSTRUCTED. 100 each 4   losartan  (COZAAR ) 25 MG tablet TAKE 1 TABLET (25 MG TOTAL) BY MOUTH DAILY. 90 tablet 3   metFORMIN  (GLUCOPHAGE ) 1000 MG tablet Take 2 tablets (2,000 mg total) by mouth daily with supper. 180 tablet 3   metoprolol  succinate (TOPROL -XL) 25 MG 24 hr tablet TAKE 1 TABLET (25 MG TOTAL) BY MOUTH DAILY. 90 tablet 3   triamcinolone  ointment (KENALOG ) 0.5 % Apply 1 Application topically 2 (two) times daily. (Patient not taking: Reported on 01/11/2024) 30 g 0   No facility-administered medications prior to visit.    ROS: Review of Systems  Constitutional:  Negative for appetite change, fatigue and unexpected weight change.   HENT:  Negative for congestion, nosebleeds, sneezing, sore throat and trouble swallowing.   Eyes:  Negative for itching and visual disturbance.  Respiratory:  Negative for cough.   Cardiovascular:  Negative for chest pain, palpitations and leg swelling.  Gastrointestinal:  Negative for abdominal distention, blood in stool, diarrhea and nausea.  Genitourinary:  Negative for frequency and hematuria.  Musculoskeletal:  Negative for back pain, gait problem, joint swelling and neck pain.  Skin:  Negative for rash.  Neurological:  Negative for dizziness, tremors, speech difficulty and weakness.  Psychiatric/Behavioral:  Negative for agitation, dysphoric mood, sleep disturbance and suicidal ideas. The patient is not nervous/anxious.     Objective:  BP 106/66   Pulse 65   Ht 6' 5 (1.956 m)   Wt 209 lb 6.4 oz (95 kg)   SpO2 98%   BMI 24.83 kg/m   BP Readings from Last 3 Encounters:  01/16/24 106/66  01/11/24 112/60  10/10/23 120/68    Wt Readings from Last 3 Encounters:  01/16/24 209 lb 6.4 oz (95 kg)  01/11/24 205 lb 6.4 oz (93.2 kg)  10/10/23 209 lb (94.8 kg)    Physical Exam Constitutional:      General: He is not in acute distress.    Appearance: He is well-developed.     Comments: NAD  Eyes:     Conjunctiva/sclera: Conjunctivae normal.     Pupils: Pupils are equal, round, and reactive to light.  Neck:     Thyroid : No thyromegaly.     Vascular: No JVD.  Cardiovascular:     Rate and Rhythm: Normal rate and regular rhythm.     Heart sounds: Normal heart sounds. No murmur heard.    No friction rub. No gallop.  Pulmonary:     Effort: Pulmonary effort is normal. No respiratory distress.     Breath sounds: Normal breath sounds. No wheezing or rales.  Chest:     Chest wall: No tenderness.  Abdominal:     General: Bowel sounds are normal. There is no distension.     Palpations: Abdomen is soft. There is no mass.     Tenderness: There is no abdominal tenderness. There is  no guarding or rebound.  Musculoskeletal:        General: No tenderness. Normal range of motion.     Cervical back: Normal range of motion.     Right lower leg: No edema.     Left lower leg: No edema.  Lymphadenopathy:     Cervical: No cervical adenopathy.  Skin:    General: Skin is warm and dry.     Findings: No rash.  Neurological:     Mental Status: He is alert and oriented to person, place, and time.     Cranial Nerves: No cranial nerve deficit.     Motor: No abnormal muscle tone.     Coordination: Coordination normal.     Gait: Gait normal.     Deep Tendon Reflexes: Reflexes are normal and symmetric.  Psychiatric:        Behavior: Behavior normal.        Thought Content: Thought content normal.        Judgment: Judgment normal.     Lab Results  Component Value Date   WBC 4.9 07/16/2023   HGB 14.2 07/16/2023   HCT 42.5 07/16/2023   PLT 134.0 (L) 07/16/2023   GLUCOSE 130 (H) 07/16/2023   CHOL 126 07/16/2023   TRIG 151.0 (H) 07/16/2023   HDL 37.60 (L) 07/16/2023   LDLDIRECT 57.0 06/28/2022   LDLCALC 58 07/16/2023   ALT 15 07/16/2023   AST 18 07/16/2023   NA 141 07/16/2023   K 4.7 07/16/2023   CL 105 07/16/2023   CREATININE 1.12 07/16/2023   BUN 15 07/16/2023   CO2 28 07/16/2023   TSH 2.18 01/15/2023   PSA 0.87 07/16/2023   HGBA1C 6.1 (A) 01/11/2024   MICROALBUR 0.8 07/10/2023    ECHOCARDIOGRAM COMPLETE Result Date: 11/17/2022    ECHOCARDIOGRAM REPORT   Patient Name:   Clifford Alvarez Date of Exam: 11/15/2022 Medical Rec #:  [id number]            Height:       77.0 in Accession #:    [id number]           Weight:       211.2 lb Date of Birth:  [birth year]             BSA:          2.289 m Patient Age:    67 years             BP:           143/74 mmHg Patient Gender: M  HR:           63 bpm. Exam Location:  Outpatient Procedure: 2D Echo, 3D Echo, Cardiac Doppler, Color Doppler and Strain Analysis Indications:    Persistent atrial fibrillation   History:        Patient has prior history of Echocardiogram examinations, most                 recent 04/08/2021. CAD, Arrythmias:Atrial Fibrillation; Risk                 Factors:Hypertension, Diabetes and Sleep Apnea. Bicuspid AV s/p                 AVR/root replacement/[facility] 01/10/19.                 Aortic Valve: 25 mm Perimount bioprosthetic valve is present in                 the aortic position. Procedure Date: 01/10/2019.  Sonographer:    Clotilda Center Referring Phys: [id number] DANIEL R BENSIMHON  Sonographer Comments: Global longitudinal strain was attempted. IMPRESSIONS  1. Left ventricular ejection fraction, by estimation, is 50 to 55%. Left ventricular ejection fraction by 3D volume is 52 %. The left ventricle has low normal function. The left ventricle has no regional wall motion abnormalities. There is mild left ventricular hypertrophy. Left ventricular diastolic parameters are indeterminate.  2. Right ventricular systolic function is mildly reduced. The right ventricular size is normal. There is normal pulmonary artery systolic pressure.  3. The mitral valve is normal in structure. Trivial mitral valve regurgitation. No evidence of mitral stenosis.  4. The aortic valve has been repaired/replaced. Aortic valve regurgitation is not visualized. No aortic stenosis is present. There is a 25 mm Perimount bioprosthetic valve present in the aortic position. Procedure Date: 01/10/2019. Echo findings are consistent with normal structure and function of the aortic valve prosthesis. Aortic valve mean gradient measures 12.5 mmHg.  5. Aortic root/ascending aorta has been repaired/replaced.  6. The inferior vena cava is normal in size with greater than 50% respiratory variability, suggesting right atrial pressure of 3 mmHg. FINDINGS  Left Ventricle: Left ventricular ejection fraction, by estimation, is 50 to 55%. Left ventricular ejection fraction by 3D volume is 52 %. The left ventricle has low normal function. The left  ventricle has no regional wall motion abnormalities. Global longitudinal strain performed but not reported based on interpreter judgement due to suboptimal tracking. The left ventricular internal cavity size was normal in size. There is mild left ventricular hypertrophy. Left ventricular diastolic parameters are indeterminate. Right Ventricle: The right ventricular size is normal. No increase in right ventricular wall thickness. Right ventricular systolic function is mildly reduced. There is normal pulmonary artery systolic pressure. The tricuspid regurgitant velocity is 1.78 m/s, and with an assumed right atrial pressure of 3 mmHg, the estimated right ventricular systolic pressure is 15.7 mmHg. Left Atrium: Left atrial size was normal in size. Right Atrium: Right atrial size was normal in size. Pericardium: There is no evidence of pericardial effusion. Mitral Valve: The mitral valve is normal in structure. Trivial mitral valve regurgitation. No evidence of mitral valve stenosis. Tricuspid Valve: The tricuspid valve is normal in structure. Tricuspid valve regurgitation is mild . No evidence of tricuspid stenosis. Aortic Valve: The aortic valve has been repaired/replaced. Aortic valve regurgitation is not visualized. No aortic stenosis is present. Aortic valve mean gradient measures 12.5 mmHg. Aortic valve peak gradient measures 20.4 mmHg. Aortic valve area,  by VTI measures 2.98 cm. There is a 25 mm Perimount bioprosthetic valve present in the aortic position. Procedure Date: 01/10/2019. Echo findings are consistent with normal structure and function of the aortic valve prosthesis. Pulmonic Valve: The pulmonic valve was normal in structure. Pulmonic valve regurgitation is mild. No evidence of pulmonic stenosis. Aorta: The aortic root/ascending aorta has been repaired/replaced. Venous: The inferior vena cava is normal in size with greater than 50% respiratory variability, suggesting right atrial pressure of 3 mmHg.  IAS/Shunts: No atrial level shunt detected by color flow Doppler.  LEFT VENTRICLE PLAX 2D LVIDd:         4.50 cm         Diastology LVIDs:         3.60 cm         LV e' medial:    6.09 cm/s LV PW:         1.00 cm         LV E/e' medial:  10.4 LV IVS:        1.10 cm         LV e' lateral:   12.50 cm/s LVOT diam:     2.30 cm         LV E/e' lateral: 5.1 LV SV:         174 LV SV Index:   76 LVOT Area:     4.15 cm        3D Volume EF                                LV 3D EF:    Left                                             ventricul LV Volumes (MOD)                            ar LV vol d, MOD    125.0 ml                   ejection A2C:                                        fraction LV vol d, MOD    177.0 ml                   by 3D A4C:                                        volume is LV vol s, MOD    60.2 ml                    52 %. A2C: LV vol s, MOD    80.6 ml A4C:                           3D Volume EF: LV SV MOD A2C:   64.8 ml       3D EF:  52 % LV SV MOD A4C:   177.0 ml      LV EDV:       191 ml LV SV MOD BP:    80.6 ml       LV ESV:       92 ml                                LV SV:        99 ml RIGHT VENTRICLE            IVC RV Basal diam:  3.65 cm    IVC diam: 2.40 cm RV S prime:     8.49 cm/s TAPSE (M-mode): 1.7 cm LEFT ATRIUM             Index        RIGHT ATRIUM           Index LA diam:        5.30 cm 2.32 cm/m   RA Area:     17.30 cm LA Vol (A2C):   56.5 ml 24.68 ml/m  RA Volume:   41.40 ml  18.08 ml/m LA Vol (A4C):   55.8 ml 24.38 ml/m LA Biplane Vol: 58.0 ml 25.34 ml/m  AORTIC VALVE                     PULMONIC VALVE AV Area (Vmax):    2.86 cm      PR End Diast Vel: 6.05 msec AV Area (Vmean):   3.03 cm AV Area (VTI):     2.98 cm AV Vmax:           226.00 cm/s AV Vmean:          168.500 cm/s AV VTI:            0.586 m AV Peak Grad:      20.4 mmHg AV Mean Grad:      12.5 mmHg LVOT Vmax:         155.36 cm/s LVOT Vmean:        122.907 cm/s LVOT VTI:          0.420 m LVOT/AV VTI ratio: 0.72   AORTA Ao Root diam: 3.30 cm Ao Asc diam:  3.10 cm MITRAL VALVE               TRICUSPID VALVE MV Area (PHT): 3.37 cm    TR Peak grad:   12.7 mmHg MV Decel Time: 225 msec    TR Vmax:        178.00 cm/s MR Peak grad: 25.9 mmHg MR Vmax:      254.50 cm/s  SHUNTS MV E velocity: 63.20 cm/s  Systemic VTI:  0.42 m                            Systemic Diam: 2.30 cm Soyla Merck MD Electronically signed by Soyla Merck MD Signature Date/Time: 11/17/2022/7:07:27 AM    Final     Assessment & Plan:   Problem List Items Addressed This Visit     Well adult exam   Relevant Orders   TSH   Urinalysis   CBC with Differential/Platelet   Lipid panel   PSA   Comprehensive metabolic panel with GFR   Hemoglobin A1c   Microalbumin / creatinine urine ratio   Type 2 diabetes, controlled, with peripheral neuropathy (HCC) -  Primary (Chronic)   Dr Trixie On Farxiga , Metformin , Losartan ,  Lipitor      Relevant Orders   Hemoglobin A1c   Microalbumin / creatinine urine ratio   Hyperlipidemia   Relevant Orders   TSH   Lipid panel   Hypertension   On Losartan       Relevant Orders   CBC with Differential/Platelet   T2DM (type 2 diabetes mellitus) (HCC)   Relevant Orders   CBC with Differential/Platelet      No orders of the defined types were placed in this encounter.     Follow-up: Return in about 6 months (around 07/15/2024) for Wellness Exam.  Marolyn Noel, MD "

## 2024-01-16 NOTE — Assessment & Plan Note (Signed)
On Losartan 

## 2024-01-16 NOTE — Assessment & Plan Note (Signed)
 Dr Elvera Lennox On Farxiga, Metformin, Losartan,  Lipitor

## 2024-01-17 ENCOUNTER — Encounter (HOSPITAL_COMMUNITY): Payer: Self-pay | Admitting: Internal Medicine

## 2024-02-22 ENCOUNTER — Ambulatory Visit (HOSPITAL_COMMUNITY): Admitting: Internal Medicine

## 2024-05-09 ENCOUNTER — Ambulatory Visit

## 2024-07-15 ENCOUNTER — Ambulatory Visit: Admitting: Internal Medicine
